# Patient Record
Sex: Female | Born: 1975 | Race: White | Hispanic: No | Smoking: Former smoker
Health system: Southern US, Community
[De-identification: ages and names within clinical notes are randomized; demographics above are authoritative.]

## PROBLEM LIST (undated history)

## (undated) DIAGNOSIS — R0600 Dyspnea, unspecified: Secondary | ICD-10-CM

## (undated) DIAGNOSIS — D649 Anemia, unspecified: Secondary | ICD-10-CM

## (undated) DIAGNOSIS — R87629 Unspecified abnormal cytological findings in specimens from vagina: Secondary | ICD-10-CM

## (undated) DIAGNOSIS — J45909 Unspecified asthma, uncomplicated: Secondary | ICD-10-CM

## (undated) DIAGNOSIS — F419 Anxiety disorder, unspecified: Secondary | ICD-10-CM

## (undated) DIAGNOSIS — N736 Female pelvic peritoneal adhesions (postinfective): Secondary | ICD-10-CM

## (undated) DIAGNOSIS — J449 Chronic obstructive pulmonary disease, unspecified: Secondary | ICD-10-CM

## (undated) HISTORY — DX: Female pelvic peritoneal adhesions (postinfective): N73.6

## (undated) HISTORY — DX: Anemia, unspecified: D64.9

## (undated) HISTORY — DX: Dyspnea, unspecified: R06.00

## (undated) HISTORY — PX: TUBAL LIGATION: SHX77

## (undated) HISTORY — PX: CHOLECYSTECTOMY: SHX55

## (undated) HISTORY — DX: Unspecified abnormal cytological findings in specimens from vagina: R87.629

## (undated) HISTORY — DX: Chronic obstructive pulmonary disease, unspecified: J44.9

---

## 2000-10-14 ENCOUNTER — Emergency Department (HOSPITAL_COMMUNITY): Admission: EM | Admit: 2000-10-14 | Discharge: 2000-10-15 | Payer: Self-pay | Admitting: Family Medicine

## 2000-10-21 ENCOUNTER — Other Ambulatory Visit: Admission: RE | Admit: 2000-10-21 | Discharge: 2000-10-21 | Payer: Self-pay | Admitting: Obstetrics and Gynecology

## 2001-01-05 ENCOUNTER — Inpatient Hospital Stay (HOSPITAL_COMMUNITY): Admission: AD | Admit: 2001-01-05 | Discharge: 2001-01-07 | Payer: Self-pay | Admitting: Obstetrics and Gynecology

## 2001-02-10 ENCOUNTER — Ambulatory Visit (HOSPITAL_COMMUNITY): Admission: RE | Admit: 2001-02-10 | Discharge: 2001-02-10 | Payer: Self-pay | Admitting: Obstetrics & Gynecology

## 2001-10-19 ENCOUNTER — Emergency Department (HOSPITAL_COMMUNITY): Admission: EM | Admit: 2001-10-19 | Discharge: 2001-10-19 | Payer: Self-pay

## 2002-01-09 ENCOUNTER — Emergency Department (HOSPITAL_COMMUNITY): Admission: EM | Admit: 2002-01-09 | Discharge: 2002-01-09 | Payer: Self-pay | Admitting: Emergency Medicine

## 2002-01-20 ENCOUNTER — Emergency Department (HOSPITAL_COMMUNITY): Admission: EM | Admit: 2002-01-20 | Discharge: 2002-01-20 | Payer: Self-pay | Admitting: Emergency Medicine

## 2002-07-12 ENCOUNTER — Emergency Department (HOSPITAL_COMMUNITY): Admission: EM | Admit: 2002-07-12 | Discharge: 2002-07-12 | Payer: Self-pay | Admitting: Emergency Medicine

## 2003-06-25 ENCOUNTER — Encounter: Admission: RE | Admit: 2003-06-25 | Discharge: 2003-06-25 | Payer: Self-pay | Admitting: *Deleted

## 2003-10-18 ENCOUNTER — Emergency Department (HOSPITAL_COMMUNITY): Admission: AD | Admit: 2003-10-18 | Discharge: 2003-10-18 | Payer: Self-pay | Admitting: Family Medicine

## 2004-02-01 ENCOUNTER — Emergency Department (HOSPITAL_COMMUNITY): Admission: EM | Admit: 2004-02-01 | Discharge: 2004-02-01 | Payer: Self-pay | Admitting: Emergency Medicine

## 2004-06-06 ENCOUNTER — Encounter: Admission: RE | Admit: 2004-06-06 | Discharge: 2004-06-06 | Payer: Self-pay | Admitting: Family Medicine

## 2004-06-06 ENCOUNTER — Other Ambulatory Visit: Admission: RE | Admit: 2004-06-06 | Discharge: 2004-06-06 | Payer: Self-pay | Admitting: Family Medicine

## 2004-06-08 ENCOUNTER — Encounter: Admission: RE | Admit: 2004-06-08 | Discharge: 2004-06-08 | Payer: Self-pay | Admitting: Sports Medicine

## 2004-07-03 ENCOUNTER — Emergency Department (HOSPITAL_COMMUNITY): Admission: EM | Admit: 2004-07-03 | Discharge: 2004-07-03 | Payer: Self-pay | Admitting: Emergency Medicine

## 2004-07-07 ENCOUNTER — Ambulatory Visit: Payer: Self-pay

## 2004-08-24 ENCOUNTER — Ambulatory Visit: Payer: Self-pay | Admitting: Family Medicine

## 2004-10-26 ENCOUNTER — Ambulatory Visit: Payer: Self-pay | Admitting: Sports Medicine

## 2004-11-12 ENCOUNTER — Emergency Department (HOSPITAL_COMMUNITY): Admission: EM | Admit: 2004-11-12 | Discharge: 2004-11-12 | Payer: Self-pay | Admitting: Family Medicine

## 2004-12-11 ENCOUNTER — Ambulatory Visit: Payer: Self-pay | Admitting: Family Medicine

## 2005-01-30 ENCOUNTER — Ambulatory Visit: Payer: Self-pay | Admitting: Family Medicine

## 2005-01-31 ENCOUNTER — Encounter: Admission: RE | Admit: 2005-01-31 | Discharge: 2005-01-31 | Payer: Self-pay | Admitting: Sports Medicine

## 2005-03-18 ENCOUNTER — Emergency Department (HOSPITAL_COMMUNITY): Admission: EM | Admit: 2005-03-18 | Discharge: 2005-03-18 | Payer: Self-pay | Admitting: Family Medicine

## 2005-04-26 ENCOUNTER — Ambulatory Visit: Payer: Self-pay | Admitting: Family Medicine

## 2005-06-05 ENCOUNTER — Encounter (INDEPENDENT_AMBULATORY_CARE_PROVIDER_SITE_OTHER): Payer: Self-pay | Admitting: *Deleted

## 2005-06-05 LAB — CONVERTED CEMR LAB

## 2005-06-29 ENCOUNTER — Ambulatory Visit: Payer: Self-pay | Admitting: Family Medicine

## 2005-08-19 ENCOUNTER — Emergency Department (HOSPITAL_COMMUNITY): Admission: EM | Admit: 2005-08-19 | Discharge: 2005-08-19 | Payer: Self-pay | Admitting: Emergency Medicine

## 2005-08-21 ENCOUNTER — Ambulatory Visit: Payer: Self-pay | Admitting: Family Medicine

## 2005-09-21 ENCOUNTER — Ambulatory Visit: Payer: Self-pay | Admitting: Family Medicine

## 2005-11-22 ENCOUNTER — Ambulatory Visit: Payer: Self-pay | Admitting: Family Medicine

## 2005-12-27 ENCOUNTER — Ambulatory Visit: Payer: Self-pay | Admitting: Family Medicine

## 2006-01-17 ENCOUNTER — Ambulatory Visit: Payer: Self-pay | Admitting: Family Medicine

## 2006-03-05 ENCOUNTER — Ambulatory Visit: Payer: Self-pay | Admitting: Family Medicine

## 2006-03-13 ENCOUNTER — Ambulatory Visit: Payer: Self-pay | Admitting: Family Medicine

## 2006-07-11 ENCOUNTER — Ambulatory Visit: Payer: Self-pay | Admitting: Family Medicine

## 2006-08-14 ENCOUNTER — Ambulatory Visit: Payer: Self-pay | Admitting: Sports Medicine

## 2006-08-22 ENCOUNTER — Ambulatory Visit: Payer: Self-pay | Admitting: Family Medicine

## 2006-09-04 ENCOUNTER — Ambulatory Visit: Payer: Self-pay | Admitting: Family Medicine

## 2006-10-05 ENCOUNTER — Emergency Department (HOSPITAL_COMMUNITY): Admission: EM | Admit: 2006-10-05 | Discharge: 2006-10-05 | Payer: Self-pay | Admitting: Family Medicine

## 2006-12-12 ENCOUNTER — Ambulatory Visit: Payer: Self-pay | Admitting: Family Medicine

## 2006-12-18 ENCOUNTER — Ambulatory Visit: Payer: Self-pay | Admitting: Family Medicine

## 2007-01-02 DIAGNOSIS — F17200 Nicotine dependence, unspecified, uncomplicated: Secondary | ICD-10-CM

## 2007-01-02 DIAGNOSIS — E669 Obesity, unspecified: Secondary | ICD-10-CM | POA: Insufficient documentation

## 2007-01-02 DIAGNOSIS — N879 Dysplasia of cervix uteri, unspecified: Secondary | ICD-10-CM | POA: Insufficient documentation

## 2007-01-02 DIAGNOSIS — J45909 Unspecified asthma, uncomplicated: Secondary | ICD-10-CM | POA: Insufficient documentation

## 2007-01-02 DIAGNOSIS — J309 Allergic rhinitis, unspecified: Secondary | ICD-10-CM | POA: Insufficient documentation

## 2007-01-02 DIAGNOSIS — N949 Unspecified condition associated with female genital organs and menstrual cycle: Secondary | ICD-10-CM

## 2007-01-02 DIAGNOSIS — F319 Bipolar disorder, unspecified: Secondary | ICD-10-CM | POA: Insufficient documentation

## 2007-01-02 HISTORY — DX: Nicotine dependence, unspecified, uncomplicated: F17.200

## 2007-01-02 HISTORY — DX: Obesity, unspecified: E66.9

## 2007-01-03 ENCOUNTER — Encounter (INDEPENDENT_AMBULATORY_CARE_PROVIDER_SITE_OTHER): Payer: Self-pay | Admitting: *Deleted

## 2007-01-10 ENCOUNTER — Ambulatory Visit: Payer: Self-pay | Admitting: Sports Medicine

## 2007-01-10 DIAGNOSIS — F411 Generalized anxiety disorder: Secondary | ICD-10-CM | POA: Insufficient documentation

## 2007-02-21 ENCOUNTER — Telehealth (INDEPENDENT_AMBULATORY_CARE_PROVIDER_SITE_OTHER): Payer: Self-pay | Admitting: *Deleted

## 2007-02-28 ENCOUNTER — Ambulatory Visit: Payer: Self-pay | Admitting: Family Medicine

## 2007-05-07 ENCOUNTER — Telehealth: Payer: Self-pay | Admitting: *Deleted

## 2007-05-12 ENCOUNTER — Telehealth: Payer: Self-pay | Admitting: *Deleted

## 2007-07-14 ENCOUNTER — Encounter (INDEPENDENT_AMBULATORY_CARE_PROVIDER_SITE_OTHER): Payer: Self-pay | Admitting: *Deleted

## 2007-07-25 ENCOUNTER — Ambulatory Visit: Payer: Self-pay | Admitting: Family Medicine

## 2007-07-25 ENCOUNTER — Telehealth (INDEPENDENT_AMBULATORY_CARE_PROVIDER_SITE_OTHER): Payer: Self-pay | Admitting: *Deleted

## 2007-09-19 ENCOUNTER — Ambulatory Visit: Payer: Self-pay | Admitting: Family Medicine

## 2007-09-19 DIAGNOSIS — N926 Irregular menstruation, unspecified: Secondary | ICD-10-CM

## 2007-09-19 HISTORY — DX: Irregular menstruation, unspecified: N92.6

## 2007-09-24 ENCOUNTER — Ambulatory Visit: Payer: Self-pay | Admitting: Family Medicine

## 2007-10-08 ENCOUNTER — Ambulatory Visit: Payer: Self-pay | Admitting: Family Medicine

## 2007-10-08 ENCOUNTER — Other Ambulatory Visit: Admission: RE | Admit: 2007-10-08 | Discharge: 2007-10-08 | Payer: Self-pay | Admitting: Emergency Medicine

## 2007-10-08 ENCOUNTER — Encounter (INDEPENDENT_AMBULATORY_CARE_PROVIDER_SITE_OTHER): Payer: Self-pay | Admitting: *Deleted

## 2007-10-08 ENCOUNTER — Telehealth: Payer: Self-pay | Admitting: *Deleted

## 2007-10-08 LAB — CONVERTED CEMR LAB
Beta hcg, urine, semiquantitative: NEGATIVE
Chlamydia, DNA Probe: NEGATIVE
Glucose, Urine, Semiquant: NEGATIVE
Ketones, urine, test strip: NEGATIVE
Nitrite: NEGATIVE
Protein, U semiquant: NEGATIVE
Urobilinogen, UA: 0.2

## 2007-10-13 ENCOUNTER — Ambulatory Visit (HOSPITAL_COMMUNITY): Admission: RE | Admit: 2007-10-13 | Discharge: 2007-10-13 | Payer: Self-pay | Admitting: Family Medicine

## 2007-10-13 ENCOUNTER — Encounter (INDEPENDENT_AMBULATORY_CARE_PROVIDER_SITE_OTHER): Payer: Self-pay | Admitting: *Deleted

## 2007-11-28 ENCOUNTER — Ambulatory Visit: Payer: Self-pay | Admitting: Family Medicine

## 2007-12-23 ENCOUNTER — Ambulatory Visit: Payer: Self-pay | Admitting: Family Medicine

## 2008-01-12 ENCOUNTER — Telehealth (INDEPENDENT_AMBULATORY_CARE_PROVIDER_SITE_OTHER): Payer: Self-pay | Admitting: *Deleted

## 2008-01-12 ENCOUNTER — Ambulatory Visit: Payer: Self-pay | Admitting: Sports Medicine

## 2008-03-18 ENCOUNTER — Telehealth: Payer: Self-pay | Admitting: Family Medicine

## 2008-05-20 ENCOUNTER — Telehealth: Payer: Self-pay | Admitting: Family Medicine

## 2008-06-12 ENCOUNTER — Emergency Department (HOSPITAL_COMMUNITY): Admission: EM | Admit: 2008-06-12 | Discharge: 2008-06-12 | Payer: Self-pay | Admitting: Emergency Medicine

## 2008-06-29 ENCOUNTER — Encounter: Payer: Self-pay | Admitting: *Deleted

## 2008-06-30 ENCOUNTER — Encounter: Payer: Self-pay | Admitting: Family Medicine

## 2008-06-30 ENCOUNTER — Ambulatory Visit: Payer: Self-pay | Admitting: Family Medicine

## 2008-06-30 LAB — CONVERTED CEMR LAB
ALT: 14 units/L (ref 0–35)
BUN: 7 mg/dL (ref 6–23)
Chloride: 110 meq/L (ref 96–112)
Creatinine, Ser: 0.7 mg/dL (ref 0.40–1.20)
Potassium: 4.1 meq/L (ref 3.5–5.3)
Sodium: 142 meq/L (ref 135–145)
Total Bilirubin: 0.3 mg/dL (ref 0.3–1.2)
Total Protein: 5.4 g/dL — ABNORMAL LOW (ref 6.0–8.3)

## 2008-07-09 ENCOUNTER — Ambulatory Visit: Payer: Self-pay | Admitting: Family Medicine

## 2008-07-09 DIAGNOSIS — K580 Irritable bowel syndrome with diarrhea: Secondary | ICD-10-CM | POA: Insufficient documentation

## 2008-07-09 DIAGNOSIS — K589 Irritable bowel syndrome without diarrhea: Secondary | ICD-10-CM | POA: Insufficient documentation

## 2008-07-09 HISTORY — DX: Irritable bowel syndrome, unspecified: K58.9

## 2008-08-10 ENCOUNTER — Telehealth: Payer: Self-pay | Admitting: *Deleted

## 2008-08-13 ENCOUNTER — Ambulatory Visit: Payer: Self-pay | Admitting: Family Medicine

## 2009-02-17 ENCOUNTER — Emergency Department (HOSPITAL_COMMUNITY): Admission: EM | Admit: 2009-02-17 | Discharge: 2009-02-17 | Payer: Self-pay | Admitting: Emergency Medicine

## 2009-02-17 ENCOUNTER — Telehealth: Payer: Self-pay | Admitting: Family Medicine

## 2009-05-09 ENCOUNTER — Emergency Department (HOSPITAL_COMMUNITY): Admission: EM | Admit: 2009-05-09 | Discharge: 2009-05-09 | Payer: Self-pay | Admitting: Emergency Medicine

## 2009-06-07 ENCOUNTER — Ambulatory Visit: Payer: Self-pay | Admitting: Family Medicine

## 2009-06-07 ENCOUNTER — Telehealth: Payer: Self-pay | Admitting: Family Medicine

## 2009-06-07 DIAGNOSIS — R109 Unspecified abdominal pain: Secondary | ICD-10-CM | POA: Insufficient documentation

## 2009-06-07 LAB — CONVERTED CEMR LAB
Bilirubin Urine: NEGATIVE
Glucose, Urine, Semiquant: NEGATIVE
Ketones, urine, test strip: NEGATIVE
Nitrite: NEGATIVE
Protein, U semiquant: NEGATIVE
Specific Gravity, Urine: 1.02
Urobilinogen, UA: 0.2
WBC Urine, dipstick: NEGATIVE
pH: 7

## 2009-09-15 ENCOUNTER — Ambulatory Visit: Payer: Self-pay | Admitting: Family Medicine

## 2009-09-15 ENCOUNTER — Telehealth: Payer: Self-pay | Admitting: Family Medicine

## 2009-09-15 DIAGNOSIS — R519 Headache, unspecified: Secondary | ICD-10-CM | POA: Insufficient documentation

## 2009-09-15 DIAGNOSIS — R51 Headache: Secondary | ICD-10-CM | POA: Insufficient documentation

## 2009-10-11 ENCOUNTER — Telehealth: Payer: Self-pay | Admitting: Family Medicine

## 2010-01-31 ENCOUNTER — Telehealth: Payer: Self-pay | Admitting: Family Medicine

## 2010-02-01 ENCOUNTER — Ambulatory Visit: Payer: Self-pay | Admitting: Family Medicine

## 2010-02-01 DIAGNOSIS — K625 Hemorrhage of anus and rectum: Secondary | ICD-10-CM | POA: Insufficient documentation

## 2010-02-01 LAB — CONVERTED CEMR LAB
Blood in Urine, dipstick: NEGATIVE
Glucose, Urine, Semiquant: NEGATIVE
WBC Urine, dipstick: NEGATIVE
pH: 6

## 2010-09-24 ENCOUNTER — Encounter: Payer: Self-pay | Admitting: Family Medicine

## 2010-10-05 ENCOUNTER — Telehealth (INDEPENDENT_AMBULATORY_CARE_PROVIDER_SITE_OTHER): Payer: Self-pay | Admitting: *Deleted

## 2010-10-05 ENCOUNTER — Encounter: Payer: Self-pay | Admitting: Family Medicine

## 2010-10-05 ENCOUNTER — Ambulatory Visit: Payer: Self-pay | Admitting: Family Medicine

## 2010-10-05 DIAGNOSIS — N771 Vaginitis, vulvitis and vulvovaginitis in diseases classified elsewhere: Secondary | ICD-10-CM

## 2010-10-05 DIAGNOSIS — N912 Amenorrhea, unspecified: Secondary | ICD-10-CM

## 2010-10-05 DIAGNOSIS — R358 Other polyuria: Secondary | ICD-10-CM

## 2010-10-05 LAB — CONVERTED CEMR LAB
Beta hcg, urine, semiquantitative: NEGATIVE
Bilirubin Urine: NEGATIVE
Blood in Urine, dipstick: NEGATIVE
Chlamydia, DNA Probe: NEGATIVE
GC Probe Amp, Genital: NEGATIVE
Glucose, Urine, Semiquant: NEGATIVE
Nitrite: NEGATIVE
Protein, U semiquant: NEGATIVE
Specific Gravity, Urine: 1.01
Urobilinogen, UA: 0.2
Whiff Test: NEGATIVE
pH: 7

## 2010-10-19 ENCOUNTER — Encounter: Payer: Self-pay | Admitting: Family Medicine

## 2010-10-19 ENCOUNTER — Ambulatory Visit: Payer: Self-pay | Admitting: Family Medicine

## 2010-10-19 DIAGNOSIS — R87613 High grade squamous intraepithelial lesion on cytologic smear of cervix (HGSIL): Secondary | ICD-10-CM | POA: Insufficient documentation

## 2010-10-25 ENCOUNTER — Encounter: Payer: Self-pay | Admitting: Family Medicine

## 2010-12-05 NOTE — Assessment & Plan Note (Signed)
Summary: vaginal itching , discharge,  period 5 days past due/ls   Vital Signs:  Patient profile:   35 year old female Height:      67.5 inches Weight:      223.9 pounds BMI:     34.67 Temp:     98.2 degrees F oral Pulse rate:   74 / minute BP sitting:   127 / 79  (left arm) Cuff size:   regular  Vitals Entered By: Garen Grams LPN (October 05, 2010 9:19 AM) CC: vaginal itching, discharge, amenorrhea Is Patient Diabetic? No Pain Assessment Patient in pain? no        Primary Care Avin Upperman:  Renold Don MD  CC:  vaginal itching, discharge, and amenorrhea.  History of Present Illness: 35 y/o F with   Vaginal itching since last Wed (1wk) that worsened last night.   +Vaginal discharge: clear, noticed it last week.  No odor. No painful intercourse No Fever, no chills, +nausea x 2 weeks, no vomiting, +constipation, -diarrhea, +some abd cramping.  No abd bleeding.   Amenorrahea: "was supposed to start on Sat".  Admits to not having regular menses.  LMP  09/02/10 Sexually active with her husband.  BTL 2002.  Habits & Providers  Alcohol-Tobacco-Diet     Tobacco Status: current     Tobacco Counseling: to quit use of tobacco products     Cigarette Packs/Day: 1.0  Allergies: No Known Drug Allergies  Review of Systems       per hpi   Physical Exam  General:  Well-developed,well-nourished,in no acute distress; alert,appropriate and cooperative throughout examination. vitals reviewed.  Genitalia:  Normal introitus for age, no external lesions, no vaginal discharge, mucosa pink and moist, no vaginal or cervical lesions, no vaginal atrophy, no friaility or hemorrhage, normal uterus size and position, no adnexal masses or tenderness   Impression & Recommendations:  Problem # 1:  VAGINITIS&VULVOVAGINITIS DISEASES CLASS ELSW (ICD-616.11) Assessment New Wet prep showed yeast, no clue cells or trich.  Will treat with Diflucan 150mg  x 1.    Orders: GC/Chlamydia-[facility]  (87591/87491) Wet Prep- [facility] (16109) [facility]- Est Level  3 (60454)  Problem # 2:  SCREENING FOR MALIGNANT NEOPLASM OF THE CERVIX (ICD-V76.2) No pap since 2006 because pt does not have insurance and does not have money to pay for visit.  Since we were doing speculum exam, I asked pt if she would like me to perform pap and she agreed.  She states that she is hoping to get a job soon so will have money to pay for exam.    Orders: Pap Smear-[facility] (09811-91478) [facility]- Est Level  3 (29562)  Problem # 3:  AMENORRHEA (ICD-626.0) Assessment: New UPT neg.  UA negative.    Orders: U Preg-[facility] (81025) [facility]- Est Level  3 (99213)  Complete Medication List: 1)  Klonopin 0.5 Mg Tabs (Clonazepam) .... Take 1 tablet by mouth daily as needed for anxiety 2)  Proventil Hfa 108 (90 Base) Mcg/act Aers (Albuterol sulfate) .... Inhale 2 puff using inhaler every four hours 3)  Nasarel 29 Mcg/act Soln (Flunisolide) .... 2 sprays each nostril two times a day 4)  Singulair 10 Mg Tabs (Montelukast sodium) .Marland Kitchen.. 1 by mouth daily 5)  Acetaminophen 650 Mg Cr-tabs (Acetaminophen) .... One tab by mouth q8h as needed for pain. 6)  Ibuprofen 800 Mg Tabs (Ibuprofen) .... One tab by mouth q6h as needed for pain. 7)  Ultram 50 Mg Tabs (Tramadol hcl) .... Take 1 tab by mouth  every 8 hours as needed for pain 8)  Hemorrhoidal-hc 25 Mg Supp (Hydrocortisone acetate) .... Apply per rectum twice a day 9)  Fluconazole 150 Mg Tabs (Fluconazole) .Marland Kitchen.. 1 tab by mouth x 1  Other Orders: Urinalysis-[facility] (00000) Prescriptions: FLUCONAZOLE 150 MG TABS (FLUCONAZOLE) 1 tab by mouth x 1  #1 x 0   Entered and Authorized by:   Angeline Slim MD   Signed by:   Angeline Slim MD on 10/05/2010   Method used:   Electronically to        [facility] [phone]* (retail)       [address]       [place], [place]  [address]       Ph: [phone]       Fax: [phone]   RxID:   [id number]    Orders Added: 1)  Urinalysis-[facility] [00000] 2)  U Preg-[facility]  [81025] 3)  GC/Chlamydia-[facility] [87591/87491] 4)  Wet Prep- [facility] [87210] 5)  Pap Smear-[facility] [18841-66063] 6)  [facility]- Est Level  3 [01601]     Prevention & Chronic Care Immunizations   Influenza vaccine: Fluvax Non-MCR  (08/13/2008)   Influenza vaccine due: 09/23/2008    Tetanus booster: 06/09/2004: Done.   Tetanus booster due: 06/09/2014    Pneumococcal vaccine: Not documented  Other Screening   Pap smear: Done.  (06/05/2005)   Pap smear due: 06/05/2006   Smoking status: current  (10/05/2010)   Smoking cessation counseling: yes  (07/09/2008)   Laboratory Results   Urine Tests  Date/Time Received: October 05, 2010 9:24 AM  Date/Time Reported: October 05, 2010 9:43 AM   Routine Urinalysis   Color: yellow Appearance: Clear Glucose: negative   (Normal Range: Negative) Bilirubin: negative   (Normal Range: Negative) Ketone: negative   (Normal Range: Negative) Spec. Gravity: 1.010   (Normal Range: 1.003-1.035) Blood: negative   (Normal Range: Negative) pH: 7.0   (Normal Range: 5.0-8.0) Protein: negative   (Normal Range: Negative) Urobilinogen: 0.2   (Normal Range: 0-1) Nitrite: negative   (Normal Range: Negative) Leukocyte Esterace: negative   (Normal Range: Negative)    Urine HCG: negative Comments: ...............test performed by......Marland KitchenBonnie A. [place]az[place], MLS (ASCP)cm  Date/Time Received: October 05, 2010 10:11 AM  Date/Time Reported: October 05, 2010 10:12 AM   [employer] Source: vaginal WBC/hpf: 5-10 Bacteria/hpf: 3+  Rods Clue cells/hpf: none  Negative whiff Yeast/hpf: few Trichomonas/hpf: none Comments: ...........test performed by...........Marland KitchenTerese Door, CMA

## 2010-12-05 NOTE — Progress Notes (Signed)
Summary: triage  Phone Note Call from Patient Call back at Home Phone [phone]   Caller: Patient Summary of Call: thinks she has yeast infection and needs to talk to nurse before she decides whether to come in. Initial call taken by: De Nurse,  October 05, 2010 8:33 AM  Follow-up for Phone Call        patient states she has vaginal itching and discharge, Five days late with period. appointment scheduled for work in this AM . Follow-up by: Theresia Lo RN,  October 05, 2010 8:44 AM

## 2010-12-05 NOTE — Miscellaneous (Signed)
  Clinical Lists Changes  Problems: Changed problem from ASTHMA, UNSPECIFIED (ICD-493.90) to ASTHMA, PERSISTENT (ICD-493.90) 

## 2010-12-05 NOTE — Assessment & Plan Note (Signed)
Summary: rectal bleeding & LLQ sore/[place]   Vital Signs:  Patient profile:   35 year old female Weight:      244.1 pounds Temp:     98.6 degrees F oral Pulse rate:   83 / minute Pulse rhythm:   regular BP sitting:   121 / 79  (left arm) Cuff size:   large  Vitals Entered By: Loralee Pacas CMA (February 01, 2010 8:46 AM) CC: Rigth flank pain and rectal bleeding Pain Assessment Patient in pain? yes     Location: abdomen Intensity: 1 Onset of pain  With activity Comments rectal bleeding and Right abd and back is sore.  when she does any activity the more she would bleed. per Dr. Burnadette Pop she was told that she has IBS.   Primary Care Provider:  Marisue Ivan  MD  CC:  Rigth flank pain and rectal bleeding.  History of Present Illness: 1. Right flank pain:  She has been dealing with this for years.  She was told by PCP that it is IBS but she thinks that it may be a hernia.  She is not sure what a hernia is.  It is off and on.  Acutely worse yesterday.  She had pain in the right flank, back, and abdomen.  It started yesterday morning and lasted through the day.  Rated a 10/10 at its worst.  Described as a sharp pain.  Worse with movement or activity.  It is better this morning.  Rated a 1/10 this morning.  Did not take anything for the pain.  It improved on its own.      ROS: denies dysuria, fevers, vaginal discharge  2. Rectal bleeding:  She had some rectal bleeding yesterday.  She has had this happen before in the past back in 2007.  It is non-painful.  It is bright red blood.  Not always associated with a bowel movement.  No rectal bleeding today.      ROS: denies recent weight loss, chills / night sweats, chest pain, shortness of breath  Current Medications (verified): 1)  Klonopin 0.5 Mg Tabs (Clonazepam) .... Take 1 Tablet By Mouth Daily As Needed For Anxiety 2)  Proventil Hfa 108 (90 Base) Mcg/act Aers (Albuterol Sulfate) .... Inhale 2 Puff Using Inhaler Every Four  Hours 3)  Nasarel 29 Mcg/act  Soln (Flunisolide) .... 2 Sprays Each Nostril Two Times A Day 4)  Singulair 10 Mg  Tabs (Montelukast Sodium) .Marland Kitchen.. 1 By Mouth Daily 5)  Acetaminophen 650 Mg Cr-Tabs (Acetaminophen) .... One Tab By Mouth Q8h As Needed For Pain. 6)  Ibuprofen 800 Mg Tabs (Ibuprofen) .... One Tab By Mouth Q6h As Needed For Pain. 7)  Ultram 50 Mg Tabs (Tramadol Hcl) .... Take 1 Tab By Mouth Every 8 Hours As Needed For Pain 8)  Hemorrhoidal-Hc 25 Mg Supp (Hydrocortisone Acetate) .... Apply Per Rectum Twice A Day  Allergies: No Known Drug Allergies  Past History:  Past Medical History: Reviewed history from 11/28/2007 and no changes required. cervical dysplasia 1997, no f/u  Social History: Reviewed history from 01/02/2007 and no changes required. Single. Lives with her 4 children in [place] [employer] housing.  FOB incarcerated.  In monogamous relationship since 2004.  Attending GED classes.  Rare EtOH, no recreational drugs.  Smokes 1/2 ppd currently, in the past more.  Physical Exam  General:  Vitals reviewed, obese, no acute distress Mouth:  pharynx pink and moist.   Neck:  supple and no masses.   Lungs:  normal respiratory effort, normal breath sounds, and no wheezes.   Heart:  normal rate, regular rhythm, and no murmur.   Abdomen:  TTP in Right abdomen, flank, and back Soft / no guarding / no rebound +BS Rectal:  Normal tone External hemorrhoids present FOBT + Msk:  no CVA tenderness FROM of lower back FROM of R hip Extremities:  no lower extremity edema Psych:  not anxious appearing and not depressed appearing.     Impression & Recommendations:  Problem # 1:  FLANK PAIN, RIGHT (ICD-789.09) Assessment Deteriorated Likely MSK in origin.  Will check UA.  Educated pt about signs / symptoms of a hernia.  Will treat with Ultram for pain. Her updated medication list for this problem includes:    Acetaminophen 650 Mg Cr-tabs (Acetaminophen) ..... One tab by mouth q8h as  needed for pain.    Ibuprofen 800 Mg Tabs (Ibuprofen) ..... One tab by mouth q6h as needed for pain.    Ultram 50 Mg Tabs (Tramadol hcl) .Marland Kitchen... Take 1 tab by mouth every 8 hours as needed for pain  Orders: Urinalysis-[facility] (00000) [facility]- Est  Level 4 (16109)  Problem # 2:  RECTAL BLEEDING (ICD-569.3) Assessment: New  Likely from hemmorhoids.  No active bleeding.  Gave pt option of seeing GI, she would rather treat as if it is a hemmorhoid and see if it gets better than going to GI for a colonoscopy.  Will treat with Anusol supp.  Orders: [facility]- Est  Level 4 (99214)  Complete Medication List: 1)  Klonopin 0.5 Mg Tabs (Clonazepam) .... Take 1 tablet by mouth daily as needed for anxiety 2)  Proventil Hfa 108 (90 Base) Mcg/act Aers (Albuterol sulfate) .... Inhale 2 puff using inhaler every four hours 3)  Nasarel 29 Mcg/act Soln (Flunisolide) .... 2 sprays each nostril two times a day 4)  Singulair 10 Mg Tabs (Montelukast sodium) .Marland Kitchen.. 1 by mouth daily 5)  Acetaminophen 650 Mg Cr-tabs (Acetaminophen) .... One tab by mouth q8h as needed for pain. 6)  Ibuprofen 800 Mg Tabs (Ibuprofen) .... One tab by mouth q6h as needed for pain. 7)  Ultram 50 Mg Tabs (Tramadol hcl) .... Take 1 tab by mouth every 8 hours as needed for pain 8)  Hemorrhoidal-hc 25 Mg Supp (Hydrocortisone acetate) .... Apply per rectum twice a day  Patient Instructions: 1)  I think that you are having two seperate issues 2)  I think that your right sided pain is most likely musculoskeletal 3)  I will treat that with Ultram 4)  If it comes back or gets more severe you need to return to clinic 5)  I think that the rectal bleeding was coming from hemorrhoids 6)  I will give you some Anusol medication to help with those 7)  If the bleeding persists or gets worse, I think that you probably should see a Gastroenterologist for a colonscopy 8)  Please schedule a follow up appointment with Dr. Burnadette Pop in 1-2  weeks Prescriptions: HEMORRHOIDAL-HC 25 MG SUPP (HYDROCORTISONE ACETATE) Apply per rectum twice a day  #30 x 0   Entered and Authorized by:   Angelena Sole MD   Signed by:   Angelena Sole MD on 02/01/2010   Method used:   Electronically to        [facility] [phone]* (retail)       [address]       [place], [place]  [address]       Ph: [phone]  Fax: [phone]   RxID:   [id number] ULTRAM 50 MG TABS (TRAMADOL HCL) take 1 tab by mouth every 8 hours as needed for pain  #30 x 0   Entered and Authorized by:   Angelena Sole MD   Signed by:   Angelena Sole MD on 02/01/2010   Method used:   Electronically to        [facility] [phone]* (retail)       [address]       [place], [place]  [address]       Ph: [phone]       Fax: [phone]   RxID:   [id number]   Laboratory Results   Urine Tests  Date/Time Received: February 01, 2010 9:22 AM  Date/Time Reported: February 01, 2010 9:35 AM   Routine Urinalysis   Color: yellow Appearance: Clear Glucose: negative   (Normal Range: Negative) Bilirubin: negative   (Normal Range: Negative) Ketone: negative   (Normal Range: Negative) Spec. Gravity: 1.010   (Normal Range: 1.003-1.035) Blood: negative   (Normal Range: Negative) pH: 6.0   (Normal Range: 5.0-8.0) Protein: negative   (Normal Range: Negative) Urobilinogen: 0.2   (Normal Range: 0-1) Nitrite: negative   (Normal Range: Negative) Leukocyte Esterace: negative   (Normal Range: Negative)    Comments: ...........test performed by...........Marland KitchenTerese Door, CMA

## 2010-12-05 NOTE — Progress Notes (Signed)
Summary: triage  Phone Note Call from Patient Call back at Home Phone [phone]   Caller: Patient Summary of Call: bright red blood in rectum and side sore Initial call taken by: De Nurse,  January 31, 2010 9:09 AM  Follow-up for Phone Call        steady bleeding this am. not associated with stool.  last bm was a few minutes ago. does not think she is bleeding now. has IBS  LMP was 2 weeks ago. . lower back is sore up to shoulders. has been building a Public affairs consultant. Thinks back pain might be from the new activity. has taken tylenol.  she is fearful of a hernia. described to her what one was & how it looks. she states the LLQ is slightly swollen all over. offered work in today. does not want that because all 4 kids are out of school & there will be a wait. appt at 8:30 am tomorrow. told her to call back if problems or she changes her mind. reminded her that we have a doctor on call when we are closed Follow-up by: Golden Circle RN,  January 31, 2010 9:41 AM

## 2010-12-05 NOTE — Assessment & Plan Note (Signed)
 Summary: HA since last night/[facility]/[place] pt   Vital Signs:  Patient profile:   35 year old female Height:      67.5 inches Weight:      244.6 pounds BMI:     37.88 Temp:     97.6 degrees F oral Pulse rate:   60 / minute BP sitting:   122 / 82  (left arm)  Vitals Entered By: Olam Keeling (September 15, 2009 3:49 PM) CC: C/o HA X 1 day, Headache Is Patient Diabetic? No Pain Assessment Patient in pain? yes     Location: head Intensity: 6 Type: sharp   CC:  C/o HA X 1 day and Headache.  Headache HPI:      The patient comes in for an acute visit for headaches.  The current headache started approximately 09/12/2009.  The headaches will last anywhere from 2 hours to 3 days at a time.  There is a family history of migraine headaches.        On a scale of 1-10, she describes the headache as a 7.  The headaches are not associated with an aura.  The location of the headaches are bilateral-starts left.  Headache quality is pressure or tightness and sharp (knife-like).  The headaches are associated with tearing of the eyes.        The patient denies first or worst H/A of life, change in frequency from prior H/A's, change in severity from prior H/A's, change in features from prior H/A's, new onset H/A's in middle-age or later, new or progressive H/A lasting days, H/A's with Valsalva (cough/sneeze), mylagia, fever, malaise, weight loss, scalp tenderness, jaw claudication, focal neurologic symptoms, confusion, seizures, and impaired level of consciousness.        Additional history: Worse with laying down in bed, better in morning. Starts behind left eye, with watering or eye, moves around entire head later and hurts in neck like a muscle tightening.    Headache Treatment History:      NSAID's were tried but not effective.  She has tried acetaminophen -plain and ibuprofen  which were ineffective.     Habits & Providers  Alcohol-Tobacco-Diet     Tobacco Status: current     Tobacco  Counseling: to quit use of tobacco products     Cigarette Packs/Day: 1.0  Allergies: No Known Drug Allergies  Past History:  Past Medical History: Last updated: 11/28/2007 cervical dysplasia 1997, no f/u  Past Surgical History: Last updated: 11/28/2007 BTL 2002 cholecystecomy 1998  Family History: Last updated: Feb 01, 2007 brother HTN, father deceased 2/2 MI at 35yo, mother had cervical ca, depression, no DM, breast or colon ca, sister has bipolar affective disorder, cervical ca  Social History: Last updated: Feb 01, 2007 Single. Lives with her 4 children in [place] [employer] housing.  FOB incarcerated.  In monogamous relationship since 2004.  Attending GED classes.  Rare EtOH, no recreational drugs.  Smokes 1/2 ppd currently, in the past more.  Social History: Packs/Day:  1.0  Review of Systems       See HPI  Physical Exam  General:  Well-developed,well-nourished,in no acute distress; alert,appropriate and cooperative throughout examination Head:  Normocephalic and atraumatic without obvious abnormalities. No apparent alopecia or balding. Eyes:  No corneal or conjunctival inflammation noted. EOMI. Perrla. Funduscopic exam benign, without hemorrhages, exudates or papilledema. Vision grossly normal. Neck:  No deformities, masses, or tenderness noted.  Headache Neurologic Exam:    Cranial Nerves Intact:  Yes    Focal Neurologic Deficit:  No  Motor Exam/Strength-Left:       Left Biceps Flexion ( ):  normal       Left Triceps Extension ( ):  normal       Left Hand Grasp ( ):  normal       Left Deltoid ( ):  normal       Left Hip Flexors ( ):  normal       Left Ankle Dorsiflexion (L5,L4):  normal       Left Great Toe Dorsiflexion (L5,L4):  normal       Left Heel Walk (L5,some L4):  normal       Left Single Squat & Rise-Quads (L4):  normal       Left Toe Walk-calf (S1):  normal    Motor Exam/Strength-Right:       Right Biceps Flexion ( ):  normal       Right Triceps Extension (  ):  normal       Right Hand Grasp ( ):  normal       Right Deltoid ( ):  normal       Right Hip Flexors ( ):  normal       Right Ankle Dorsiflexion (L5,L4):  normal       Right Great Toe Dorsiflexion (L5,L4):  normal       Right Heel Walk (L5,some L4):  normal       Right Single Squat & Rise-Quads (L4):  normal       Right Toe Walk-calf (S1):  normal    Sensory Exam/Pinprick-Left:       Left Face:  normal       Left Upper Extremity:  normal       Left Lower Extremity:  normal       Left Medial Foot (L4):  normal       Left Dorsal Foot (L5):  normal       Left Lateral Foot (S1):  normal    Sensory Exam/Pinprick-Right:       Right Face:  normal       Right Upper Extremity:  normal       Right Lower Extremity:  normal       Right Medial Foot (L4):  normal       Right Dorsal Foot (L5):  normal       Right Lateral Foot (S1):  normal    Reflexes-Left:       Left Biceps ( ):  normal       Left Triceps ( ):  normal       Left Brachioradialis ( ):  normal       Left Knee Jerk (L4):  normal       Left Ankle Reflex (S1):  normal    Reflexes-Right:       Right Biceps ( ):  normal       Right Triceps ( ):  normal       Right Brachioradialis ( ):  normal       Right Knee Jerk (L4):  normal       Right Ankle Reflex (S1):  normal   Complete Medication List: 1)  Klonopin  0.5 Mg Tabs (Clonazepam ) .... Take 1 tablet by mouth daily as needed for anxiety 2)  Proventil  Hfa 108 (90 Base) Mcg/act Aers (Albuterol  sulfate) .... Inhale 2 puff using inhaler every four hours 3)  Nasarel  29 Mcg/act Soln (Flunisolide ) .... 2 sprays each nostril two times a day 4)  Singulair   10 Mg Tabs (Montelukast  sodium) .SABRA.. 1 by mouth daily 5)  Acetaminophen  650 Mg Cr-tabs (Acetaminophen ) .... One tab by mouth q8h as needed for pain. 6)  Ibuprofen  800 Mg Tabs (Ibuprofen ) .... One tab by mouth q6h as needed for pain.  Other Orders: [facility]- Est Level  3 (99213) Ketorolac -Toradol  15mg  (G8114)  Headache  Assessment/Plan:    Headache Classification:  Tension headache    Comments:  Giving oxygen 6L via [place] x15 mins  (no non-rebreather available), not effective.    Additional Plans:  Will give Toradol  30 now, then Acetaminophen  650 and ibuprofen  800.  RTC within a week if no   HA 90% resolved and pt able to lay flat after shot of toradol .  Patient Instructions: 1)  Tylenol  650, Ibuprofen  800. 2)  RTC within a week if no better. 3)  -Dr. ONEIDA. Prescriptions: IBUPROFEN  800 MG TABS (IBUPROFEN ) One tab by mouth q6h as needed for pain.  #30 x 3   Entered and Authorized by:   Debby Petties MD   Signed by:   Debby Petties MD on 09/15/2009   Method used:   Electronically to        [facility] [phone]* (retail)       [address]       [place], [place]  [address]       Ph: [phone]       Fax: [phone]   RxID:   [id number] ACETAMINOPHEN  650 MG CR-TABS (ACETAMINOPHEN ) One tab by mouth q8h as needed for pain.  #30 x 3   Entered and Authorized by:   Debby Petties MD   Signed by:   Debby Petties MD on 09/15/2009   Method used:   Electronically to        [facility] [phone]* (retail)       [address]       [place], [place]  [address]       Ph: [phone]       Fax: [phone]   RxID:   [id number]    Medication Administration  Injection # 1:    Medication: Ketorolac -Toradol  15mg     Diagnosis: HEADACHE (ICD-784.0)    Route: IM    Site: RUOQ gluteus    Exp Date: 06/06/2011    Lot #: [id number]    Mfr: hospira    Patient tolerated injection without complications    Given by: Olam Keeling (September 15, 2009 4:46 PM)  Orders Added: 1)  [facility]- Est Level  3 [99213] 2)  Ketorolac -Toradol  15mg  [G8114]

## 2010-12-07 NOTE — Miscellaneous (Signed)
Summary: Procedures consent  Procedures consent   Imported By: De Nurse 10/31/2010 11:35:09  _____________________________________________________________________  External Attachment:    Type:   Image     Comment:   External Document

## 2010-12-07 NOTE — Assessment & Plan Note (Signed)
Summary: COLPO CLINIC PER DR TA/RH   Vital Signs:  Patient profile:   35 year old female LMP:     08/30/2010 Weight:      220 pounds Temp:     98.9 degrees F oral Pulse rate:   105 / minute Pulse rhythm:   regular BP sitting:   102 / 72  (left arm) Cuff size:   regular  Vitals Entered By: Loralee Pacas CMA (October 19, 2010 11:02 AM)  Primary Care Provider:  Renold Don MD   History of Present Illness: Pt comes in for colposcopy.  Pt has had one in past, many years ago, but no problems with pap smear since then.  She complains of groin pain from ovarian cysts and pain during intercourse.  She says she has not had a period sine 09/02/10, but that she had her tubes tied and does not think she is pregnant.   Allergies: No Known Drug Allergies  Physical Exam  General:  Well-developed,well-nourished,in no acute distress; alert,appropriate and cooperative throughout examination Genitalia:  Normal introitus for age, no external lesions, no vaginal discharge, mucosa pink and moist, small cervical lesion seen with acetic acid in 2 o'clock position cervical lesions, no vaginal atrophy, no friaility or hemorrhage, normal uterus size and position, no adnexal masses or tenderness Additional Exam:  Patient given informed consent, signed copy in the chart. Placed in lithotomy position. Cervix viewed with speculum and colposcope. Was the entire squamocolumnar junction seen?yes Any acetowhite lesions noted?yes Any abnormalities seen with green filter?no Any abnormalities seen with application of Lugol's solution?no Was the endocervical canal sampled?no Were any cervical biopsies taken?yes Were there any complications?no COMMENTS: Patient was given post procedure instructions. We will notify her of results.    Impression & Recommendations:  Problem # 1:  PAP SMER CERV W/HI GRADE SQUAMOUS INTRAEPITH LES (ICD-795.04)  Orders: Colposcopy w/ biopsy - [facility] ([id number])  Complete Medication  List: 1)  Klonopin 0.5 Mg Tabs (Clonazepam) .... Take 1 tablet by mouth daily as needed for anxiety 2)  Proventil Hfa 108 (90 Base) Mcg/act Aers (Albuterol sulfate) .... Inhale 2 puff using inhaler every four hours 3)  Nasarel 29 Mcg/act Soln (Flunisolide) .... 2 sprays each nostril two times a day 4)  Singulair 10 Mg Tabs (Montelukast sodium) .Marland Kitchen.. 1 by mouth daily 5)  Acetaminophen 650 Mg Cr-tabs (Acetaminophen) .... One tab by mouth q8h as needed for pain. 6)  Ibuprofen 800 Mg Tabs (Ibuprofen) .... One tab by mouth q6h as needed for pain. 7)  Ultram 50 Mg Tabs (Tramadol hcl) .... Take 1 tab by mouth every 8 hours as needed for pain 8)  Hemorrhoidal-hc 25 Mg Supp (Hydrocortisone acetate) .... Apply per rectum twice a day 9)  Fluconazole 150 Mg Tabs (Fluconazole) .Marland Kitchen.. 1 tab by mouth x 1  Other Orders: U Preg-[facility] ([id number])   Orders Added: 1)  U Preg-[facility] [[id number]] 2)  Colposcopy w/ biopsy - [facility] [address] [[id number]]    Laboratory Results   Urine Tests  Date/Time Received: October 19, 2010 11:09 AM  Date/Time Reported: October 19, 2010 11:25 AM     Urine HCG: negative Comments: ...............test performed by......Marland KitchenBonnie A. Swaz[place], MLS (ASCP)cm

## 2010-12-07 NOTE — Letter (Signed)
Summary: COLPOSCOPY  Letter  [facility]  [address]   [place], [place] [address]   Phone: [phone]  Fax: [phone]    10/25/2010  Kieanna Spadafore [address] [place], [place]  [address]  Dear Ms. Passmore,  THe cervical biopsy did not show any pre-cancerous cells. With your last pap smear being abnormal  and you history on the past of an abnromal pap, I would recommend that we repeat ths colposcopy in 3 months. Please schedule an appointment with me in March of 2011 in [facility] clinic.   I think the best way to deal with this is a repeat colposcopy in three months--if that is negative then I think we are done with additional follow up other than routine pap smear.Please call me with questions.          Sincerely,   Denny Levy MD  Appended Document: COLPOSCOPY  Letter mailed

## 2011-01-11 ENCOUNTER — Encounter: Payer: Self-pay | Admitting: Family Medicine

## 2011-01-11 ENCOUNTER — Ambulatory Visit (INDEPENDENT_AMBULATORY_CARE_PROVIDER_SITE_OTHER): Payer: Self-pay | Admitting: Family Medicine

## 2011-01-11 VITALS — BP 110/76 | HR 84 | Temp 98.6°F | Ht 67.0 in | Wt 224.6 lb

## 2011-01-11 DIAGNOSIS — F411 Generalized anxiety disorder: Secondary | ICD-10-CM

## 2011-01-11 DIAGNOSIS — L84 Corns and callosities: Secondary | ICD-10-CM

## 2011-01-11 MED ORDER — CLONAZEPAM 0.5 MG PO TABS: 0.5000 mg | ORAL_TABLET | Freq: Every day | ORAL | Status: DC | PRN

## 2011-01-11 NOTE — Patient Instructions (Signed)
Take the Klonopin as needed.  If you have any further troubles with your foot after trying the medicated cushion let me know. Good luck with all your tests!

## 2011-01-11 NOTE — Progress Notes (Signed)
  Subjective:    Patient ID: Samson Frederic, female    DOB: 06/01/1976, 35 y.o.   MRN: [id number]  HPI 1.  Anxiety:  Patient feels this is becoming a worsening issue for her.  Has been on Klonopin in past, hasn't needed refill since 06/2009.  Is studying for her GED, very anxious the day before her tests start.  Feels nauseous, "fluttery" feeling in stomach, resolves once test is finished.  Importantly, she is planning on leaving her husband to move back home to [place] this summer after her 4 children finish out the school year.  Husband secretly used crack cocaine for 7 years before stating he had stopped, she is concerned he has started again.  He is constantly on edge, yelling at children and her.  She states she can put up with it for a few more months as long as he doesn't hit anyone, and she states he does not do this or threaten to to this.  But all this obviously compounds her anxiety.    2.  Spots on bottom of foot:  Painful spots that she has had since she was a child.  Calls them calluses but someone told her they may be "warts."  Concerned because they cause her pain when she walks, also sometimes painful when not putting weight on them.  Trims them back with a knife. Located on ball of Right foot.  No bleeding from site.  Has never seen a podiatrist.  Has in past used medicated cushions that are to treat warts, with some relief, but these two spots have never completely resolved.     Review of Systems See HPI above for review of systems.       Objective:   Physical Exam Gen:  Alert, cooperative patient who appears stated age in no acute distress.  Vital signs reviewed. HEENT:  [place]/AT.  EOMI, PERRL.  MMM, tonsils non-erythematous, non-edematous.  External ears WNL, Bilateral TM's normal without retraction, redness or bulging.  Cardiac:  Regular rate and rhythm without murmur auscultated.  Good S1/S2. Pulm:  Clear to auscultation bilaterally with good air movement.  No wheezes or  rales noted.   Abd:  Soft/nondistended/nontender.  Obese  Good bowel sounds throughout all four quadrants.  No masses noted.  Right foot:  Calluses noted on 1st and 5th pad of metatarsal head.  Thickened yellow skin present.  No desquamation, no blood vessels noted.  Somewhat tender to touch, describes as aching pain.   Left foot:  No calluses noted.   Neuro:  CN ii-XII intact.  Slight intention tremor, which patient states only starts 1-2 days before she has a test (has one tomorrow).  No other focal motor/sensation deficits.          Assessment & Plan:

## 2011-01-11 NOTE — Assessment & Plan Note (Signed)
Patient with increased anxiety, related to marital strife as detailed above as well as increased testing.   Will refill Klonopin, last refill was 06/2009.  Only uses once every week or so.  Has not needed since August 2010.   Refill 30 with 1 extra refill.   Discussed SSRI treatment, as well as counseling/therapy options.  Patient without any insurance, would like to hold off on counseling.  If needs increased Klonopin, will start SSRI with benzo bridge.

## 2011-01-12 DIAGNOSIS — L84 Corns and callosities: Secondary | ICD-10-CM

## 2011-01-12 HISTORY — DX: Corns and callosities: L84

## 2011-03-23 NOTE — Op Note (Signed)
[facility]  Patient:    Sara Booth, Sara Booth                           MRN: [id number] Proc. Date: 02/10/01 Adm. Date:  [id number] Attending:  Lars Pinks                           Operative Report  PREOPERATIVE DIAGNOSIS:       Voluntary sterilization.  POSTOPERATIVE DIAGNOSIS:      Voluntary sterilization.  PROCEDURE:                    Laparoscopic bilateral tubal cautery for                               sterilization.  SURGEON:                      Richard D. Arlyce Dice, M.D.  ANESTHESIA:                   General endotracheal.  ESTIMATED BLOOD LOSS:         5 cc.  FINDINGS:                     Normal-appearing tubes, ovaries, and uterus. There was no evidence of endometriosis or pathology in the pelvis or abdomen.  INDICATIONS:                  This is a 35 year old gravida 4, para 4, who is status post spontaneous delivery five weeks prior to surgery.  The patient requests permanent sterilization.  The options were discussed with the patient, including an IUD, the probability of regret in a woman under the age of 65 was also stressed strongly with the patient; however, since she had already had four children, she was certain that she wanted permanent sterilization.  A failure rate of 2-5 per 1000 was also discussed with the patient preoperatively.  DESCRIPTION OF PROCEDURE:     The patient was taken to the operating room and general anesthesia was induced.  She was then placed in the dorsal lithotomy position, and the abdomen, perineum, and vagina were prepped and draped in a sterile fashion.  The bladder was emptied.  A Hulka tenaculum was introduced through the endocervical canal, and affixed to the anterior lip of the cervix. The surgeon was regowned and regloved.  The Veress needle was introduced through the base of the umbilicus into the peritoneal cavity.  A pneumoperitoneum was created.  A 5 mm trocar was introduced into  the pneumoperitoneum and a 5 mm laparoscope was introduced.  Under direct visualization a 5.0 mm suprapubic puncture was made, and the Klepinger forceps was introduced.  The pelvis was viewed, with the findings as noted above.  The left fallopian tube was identified and grasped at the isthmic/ampullary junction and cauterized along a 4.0 to 5.0 cm length.  The identical procedure was carried out on the contralateral tube.  The procedure was then terminated. The gas was allowed to escape.  The umbilical incision did not require suturing or Steri-Strips.  The suprapubic wound was closed with Steri-Strips.  The patient tolerated the procedure well and left the operating room in good condition. DD:  02/10/01 TD:  02/10/01 Job: [id number]

## 2011-07-20 ENCOUNTER — Telehealth: Payer: Self-pay | Admitting: Family Medicine

## 2011-07-20 DIAGNOSIS — F411 Generalized anxiety disorder: Secondary | ICD-10-CM

## 2011-07-20 MED ORDER — CLONAZEPAM 0.5 MG PO TABS: 0.5000 mg | ORAL_TABLET | Freq: Every day | ORAL | Status: DC | PRN

## 2011-07-20 NOTE — Telephone Encounter (Signed)
Called in prescription to [facility] pharmacy.

## 2011-07-20 NOTE — Telephone Encounter (Signed)
Had been on clanzapam and needs a refill - she is upset that her son ran away again last night and really need this today [facility] [address]

## 2011-07-20 NOTE — Telephone Encounter (Addendum)
Will forward message to Dr. Gwendolyn Grant. Paged to notify MD also.

## 2011-08-19 ENCOUNTER — Inpatient Hospital Stay (INDEPENDENT_AMBULATORY_CARE_PROVIDER_SITE_OTHER)
Admission: RE | Admit: 2011-08-19 | Discharge: 2011-08-19 | Disposition: A | Discharge status: Home / Self Care | Payer: Self-pay | Source: Ambulatory Visit | Attending: Emergency Medicine | Admitting: Emergency Medicine

## 2011-08-19 DIAGNOSIS — J45909 Unspecified asthma, uncomplicated: Secondary | ICD-10-CM

## 2011-08-19 DIAGNOSIS — J019 Acute sinusitis, unspecified: Secondary | ICD-10-CM

## 2011-12-20 ENCOUNTER — Encounter: Payer: Self-pay | Admitting: Family Medicine

## 2011-12-20 ENCOUNTER — Ambulatory Visit (INDEPENDENT_AMBULATORY_CARE_PROVIDER_SITE_OTHER): Payer: Self-pay | Admitting: Family Medicine

## 2011-12-20 VITALS — BP 118/78 | HR 98 | Temp 98.7°F | Ht 67.0 in | Wt 245.8 lb

## 2011-12-20 DIAGNOSIS — J329 Chronic sinusitis, unspecified: Secondary | ICD-10-CM

## 2011-12-20 DIAGNOSIS — R87613 High grade squamous intraepithelial lesion on cytologic smear of cervix (HGSIL): Secondary | ICD-10-CM

## 2011-12-20 DIAGNOSIS — F411 Generalized anxiety disorder: Secondary | ICD-10-CM

## 2011-12-20 HISTORY — DX: Chronic sinusitis, unspecified: J32.9

## 2011-12-20 MED ORDER — DOXYCYCLINE HYCLATE 100 MG PO TABS: 100.0000 mg | ORAL_TABLET | Freq: Two times a day (BID) | ORAL | Status: AC

## 2011-12-20 MED ORDER — CLONAZEPAM 0.5 MG PO TABS: 0.5000 mg | ORAL_TABLET | Freq: Every day | ORAL | Status: DC | PRN

## 2011-12-20 MED ORDER — FLUNISOLIDE 29 MCG/ACT NA SOLN: 2.0000 | Freq: Two times a day (BID) | NASAL | Status: DC

## 2011-12-20 NOTE — Progress Notes (Signed)
  Subjective:    Patient ID: Sara Booth, female    DOB: May 06, 1976, 36 y.o.   MRN: [id number]  HPI 1. URI symptoms:  Present for past 2-3 weeks.  Describes rhinorrhea, sinus congestion, mild cough.  Has tried over the counter Sudafed, Claritin, and Allegra without relief.  Sick contacts are none.  No fevers or chills. No nausea or vomiting.  She has been experiencing headaches for the past one days as well as some tooth pain.  2.  anxiety: Patient is increased anxiety. She states this is secondary to testing. She states that the Klonopin helped last year when she was on her period with her she's been off of this. She is not taking any since starting the semester. She has failed her last test due to anxiety from school as well as caring for an developmentally delayed child at home who is in need of extra testing. No suicidal or homicidal ideations. She denies any symptoms of depression.    Review of Systems See HPI above for review of systems.       Objective:   Physical Exam Gen:  Alert, cooperative patient who appears stated age in no acute distress.  Vital signs reviewed. Cardiac:  Regular rate and rhythm without murmur auscultated.  Good S1/S2. Pulm:  Clear to auscultation bilaterally with good air movement.  No wheezes or rales noted.   HEENT:  [place]/AT.  EOMI, PERRL. nasal turbinates erythematous and edematous bilaterally. Maxillary and frontal sinuses tender to palpation. MMM, tonsils non-erythematous, non-edematous.  External ears WNL, Bilateral TM's normal without retraction, redness or bulging.  Psych: Not depressed appearing. Thought process.        Assessment & Plan:

## 2011-12-20 NOTE — Assessment & Plan Note (Signed)
Patient with increased anxiety, mostly related to increased testing at school. Also with child who is now on disability who is undergoing changes in his treatment..   Will refill Klonopin, last refill was March 2012.  Only uses once every week or so.   Refill 30 with 1 extra refill.   Discussed SSRI treatment, as well as counseling/therapy options.  Patient without any insurance, would like to hold off on counseling.  If needs increased Klonopin, will start SSRI with benzo bridge.

## 2011-12-20 NOTE — Assessment & Plan Note (Signed)
Plan to treat due to length of symptoms and no improvement.   Will prescribe doxycycline as she is penicillin allergic. Instructed patient to return in 2 weeks for checkup or sooner if worsening or no improvement.

## 2011-12-20 NOTE — Assessment & Plan Note (Signed)
We discussed this and she will make an appointment in the next 3 - 4 weeks for Pap smear once her Medicaid comes through

## 2012-03-03 ENCOUNTER — Encounter: Payer: Self-pay | Admitting: Family Medicine

## 2012-03-03 ENCOUNTER — Ambulatory Visit (INDEPENDENT_AMBULATORY_CARE_PROVIDER_SITE_OTHER): Payer: Self-pay | Admitting: Family Medicine

## 2012-03-03 VITALS — BP 138/82 | HR 102 | Temp 98.2°F | Ht 67.0 in | Wt 252.0 lb

## 2012-03-03 DIAGNOSIS — F319 Bipolar disorder, unspecified: Secondary | ICD-10-CM

## 2012-03-03 HISTORY — DX: Bipolar disorder, unspecified: F31.9

## 2012-03-03 MED ORDER — DIVALPROEX SODIUM 500 MG PO DR TAB: 500.0000 mg | DELAYED_RELEASE_TABLET | Freq: Two times a day (BID) | ORAL | Status: DC

## 2012-03-03 NOTE — Progress Notes (Signed)
  Subjective:    Patient ID: Sara Booth, female    DOB: Nov 01, 1976, 36 y.o.   MRN: [id number]  HPI Here for work in appointment for mood  "so mad all the time"  Last week, went 3 day on 4 hours of sleep, did not feel sleepy.  Today, very fatigued, "all I wanna do is sleep"  Feels it was triggered by finding out about her husband's substance abuse   At baseline, feels very tired and drained, hard to fall asleep.   Takes klonopin once weekly, has taken daily for the past week. Per patient, was told she may have bipolar disorder in the past.  Was tried on paxil and Lexapro- did not like the way it  Make her feel.  Took i dose of lithium in past, also did not like the way it made her feel.  PHQ-9 21 very difficult GAD 7: 20, very difficult MDQ: 7 yes, moderate  I have reviewed patient's  PMH, FH, and Social history and Medications as related to this visit. FH: sister with bipolar do, mom and dad with mood disorder, dad with alcoholism.  Social Hx: lives with kids and father of children.  Was going to school but no longer going due to lack of concentration.   Smoker.  Rare alcohol.  No illicit drug use. Review of Systems     Objective:   Physical Exam GEN: Alert & Oriented, No acute distress CV:  Regular Rate & Rhythm, no murmur Respiratory:  Normal work of breathing, CTAB Psych:  Tearful at times.  Mood labile, judgement fair.  Alert and oriented.      Assessment & Plan:

## 2012-03-03 NOTE — Patient Instructions (Signed)
Will start prevention medicine called depakote- take one tablet twice a day.  Return on Friday morning (Day 4) before you have taken medication for a depakote level  Contact Jaynee Eagles for helpwith financial assitance  If you feel more unstable or have concerns about your safety or that of other, please call 911  Follow-up in 1 -2 weeks

## 2012-03-03 NOTE — Assessment & Plan Note (Addendum)
Confirmed diagnosis- appears to likely be bipolar 1 with history of recent manic episode.  Patient currently uninsured- does not think she can afford any additional cost for medication but is interested in medicines + therapy.  Not open to trying lithium again at this time.  Will attempt start at depakote- will get baseline labs, gave patient written rx, and advised to shop around.  Patient to return for  T[place] after starting.  Gave her info to apply with Jaynee Eagles and medicaid so we have further treatment options.  Will refer to therapy once has assitance.  Will follow-up in 1-2 weeks.  Advised on red flags to return for sooner evaluation.

## 2012-03-04 LAB — CBC
HCT: 43.9 % (ref 36.0–46.0)
MCV: 92.8 fL (ref 78.0–100.0)
RBC: 4.73 MIL/uL (ref 3.87–5.11)

## 2012-03-04 LAB — COMPREHENSIVE METABOLIC PANEL
ALT: 35 U/L (ref 0–35)
Albumin: 4.1 g/dL (ref 3.5–5.2)
Alkaline Phosphatase: 86 U/L (ref 39–117)
BUN: 5 mg/dL — ABNORMAL LOW (ref 6–23)
Calcium: 9.1 mg/dL (ref 8.4–10.5)
Chloride: 106 mEq/L (ref 96–112)
Creat: 0.54 mg/dL (ref 0.50–1.10)
Sodium: 141 mEq/L (ref 135–145)
Total Bilirubin: 0.4 mg/dL (ref 0.3–1.2)
Total Protein: 6.4 g/dL (ref 6.0–8.3)

## 2012-06-03 ENCOUNTER — Encounter (HOSPITAL_COMMUNITY): Payer: Self-pay | Admitting: Emergency Medicine

## 2012-06-03 ENCOUNTER — Emergency Department (INDEPENDENT_AMBULATORY_CARE_PROVIDER_SITE_OTHER)
Admission: EM | Admit: 2012-06-03 | Discharge: 2012-06-03 | Disposition: A | Discharge status: Home / Self Care | Payer: Self-pay | Source: Home / Self Care

## 2012-06-03 DIAGNOSIS — R109 Unspecified abdominal pain: Secondary | ICD-10-CM

## 2012-06-03 DIAGNOSIS — R05 Cough: Secondary | ICD-10-CM

## 2012-06-03 DIAGNOSIS — R51 Headache: Secondary | ICD-10-CM

## 2012-06-03 DIAGNOSIS — R059 Cough, unspecified: Secondary | ICD-10-CM

## 2012-06-03 DIAGNOSIS — B349 Viral infection, unspecified: Secondary | ICD-10-CM

## 2012-06-03 DIAGNOSIS — B9789 Other viral agents as the cause of diseases classified elsewhere: Secondary | ICD-10-CM

## 2012-06-03 HISTORY — DX: Anxiety disorder, unspecified: F41.9

## 2012-06-03 HISTORY — DX: Unspecified asthma, uncomplicated: J45.909

## 2012-06-03 LAB — POCT URINALYSIS DIP (DEVICE)
Glucose, UA: NEGATIVE mg/dL
Leukocytes, UA: NEGATIVE
Urobilinogen, UA: 0.2 mg/dL (ref 0.0–1.0)

## 2012-06-03 LAB — POCT RAPID STREP A: Streptococcus, Group A Screen (Direct): NEGATIVE

## 2012-06-03 MED ORDER — ONDANSETRON HCL 4 MG PO TABS: 4.0000 mg | ORAL_TABLET | Freq: Four times a day (QID) | ORAL | Status: AC

## 2012-06-03 NOTE — ED Notes (Signed)
Tired, lethargic, c/o center epigastric pain and cramping, denies any change in bowel routine.  Reports nausea, sore throat.  Unknown fever.

## 2012-06-03 NOTE — ED Provider Notes (Signed)
History     CSN: [id number]  Arrival date & time 06/03/12  1734   None     Chief Complaint  Patient presents with  . URI    (Consider location/radiation/quality/duration/timing/severity/associated sxs/prior treatment) The history is provided by the patient.  Sara Booth is a 36 y.o. female who complains of onset of cold symptoms for since this morning.  scratchy sore throat + cough, non productive No pleuritic pain No wheezing + nasal congestion No post-nasal drainage + sinus pain/pressure No laryngitis No chest congestion No itchy/red eyes No earache No hemoptysis No SOB No chills/sweats No fever + nausea No vomiting + abdominal cramping No diarrhea No skin rashes No fatigue No myalgias + headache  + ill contacts   Past Medical History  Diagnosis Date  . Asthma   . Anxiety     Past Surgical History  Procedure Date  . Cholecystectomy   . Tubal ligation     No family history on file.  History  Substance Use Topics  . Smoking status: Current Some Day Smoker -- 0.5 packs/day    Types: Cigarettes  . Smokeless tobacco: Not on file  . Alcohol Use: No    OB History    Grav Para Term Preterm Abortions TAB SAB Ect Mult Living                  Review of Systems  All other systems reviewed and are negative.    Allergies  Amoxicillin; Erythromycin; and Penicillins  Home Medications   Current Outpatient Rx  Name Route Sig Dispense Refill  . ACETAMINOPHEN ER 650 MG PO TBCR Oral Take 650 mg by mouth every 8 (eight) hours as needed.      . ALBUTEROL SULFATE HFA 108 (90 BASE) MCG/ACT IN AERS Inhalation Inhale 2 puffs into the lungs every 4 (four) hours as needed.      Marland Kitchen CLONAZEPAM 0.5 MG PO TABS Oral Take 1 tablet (0.5 mg total) by mouth daily as needed. 30 tablet 1  . DIVALPROEX SODIUM 500 MG PO TBEC Oral Take 1 tablet (500 mg total) by mouth 2 (two) times daily. 60 tablet 0  . FLUCONAZOLE 150 MG PO TABS Oral Take 150 mg by mouth once.      Marland Kitchen  FLUNISOLIDE 29 MCG/ACT (0.025%) NA SOLN Nasal Place 2 sprays into the nose 2 (two) times daily. Dose is for each nostril. 25 mL 3  . HYDROCORTISONE ACETATE 25 MG RE SUPP Rectal Place 25 mg rectally 2 (two) times daily.      . IBUPROFEN 800 MG PO TABS Oral Take 800 mg by mouth every 6 (six) hours as needed.      Marland Kitchen MONTELUKAST SODIUM 10 MG PO TABS Oral Take 10 mg by mouth daily.      . TRAMADOL HCL 50 MG PO TABS Oral Take 50 mg by mouth every 8 (eight) hours as needed.        BP 116/78  Pulse 80  Temp 98 F (36.7 C) (Oral)  Resp 20  SpO2 98%  LMP 05/27/2012  Physical Exam  Nursing note and vitals reviewed. Constitutional: She is oriented to person, place, and time. Vital signs are normal. She appears well-developed and well-nourished. She is active and cooperative.  HENT:  Head: Normocephalic.  Right Ear: Hearing, tympanic membrane, external ear and ear canal normal.  Left Ear: Hearing, tympanic membrane, external ear and ear canal normal.  Nose: Nose normal.  Mouth/Throat: Uvula is midline and mucous membranes are normal.  Posterior oropharyngeal erythema present. No oropharyngeal exudate or posterior oropharyngeal edema.  Eyes: Conjunctivae are normal. Pupils are equal, round, and reactive to light. No scleral icterus.  Neck: Trachea normal. Neck supple.  Cardiovascular: Normal rate, regular rhythm, normal heart sounds and normal pulses.   Pulmonary/Chest: Effort normal and breath sounds normal.  Abdominal: Soft. Normal appearance and bowel sounds are normal. There is tenderness in the epigastric area. There is no rebound and no CVA tenderness.  Lymphadenopathy:       Head (right side): No submental, no submandibular, no tonsillar, no preauricular, no posterior auricular and no occipital adenopathy present.       Head (left side): No submental, no submandibular, no tonsillar, no preauricular, no posterior auricular and no occipital adenopathy present.    She has no cervical adenopathy.     She has no axillary adenopathy.       Cervical lymph node tenderness  Neurological: She is alert and oriented to person, place, and time. No cranial nerve deficit or sensory deficit. GCS eye subscore is 4. GCS verbal subscore is 5. GCS motor subscore is 6.  Skin: Skin is warm and dry.  Psychiatric: She has a normal mood and affect. Her speech is normal and behavior is normal. Judgment and thought content normal. Cognition and memory are normal.    ED Course  Procedures (including critical care time)  Labs Reviewed  POCT URINALYSIS DIP (DEVICE) - Abnormal; Notable for the following:    Hgb urine dipstick SMALL (*)     All other components within normal limits  POCT RAPID STREP A ([facility] URG CARE ONLY)  Patient's last menstrual period was 05/27/2012.  No results found.   1. Viral illness   2. Headache   3. Cough   4. Abdominal pain       MDM  Discussed symptoms and management with patient, reports she normally has variable abdominal cramping and pain with IBS.  RTC if symptoms are not improved or worsen.        Johnsie Kindred, NP 06/03/12 2026  Johnsie Kindred, NP 06/03/12 2031

## 2012-06-12 NOTE — ED Provider Notes (Signed)
Medical screening examination/treatment/procedure(s) were performed by non-physician practitioner and as supervising physician I was immediately available for consultation/collaboration.  Luiz Blare MD  Luiz Blare, MD 06/12/12 [phone]

## 2012-09-26 ENCOUNTER — Ambulatory Visit (INDEPENDENT_AMBULATORY_CARE_PROVIDER_SITE_OTHER): Payer: Self-pay | Admitting: Family Medicine

## 2012-09-26 VITALS — BP 126/83 | HR 102 | Temp 98.2°F | Ht 67.0 in | Wt 240.0 lb

## 2012-09-26 DIAGNOSIS — F411 Generalized anxiety disorder: Secondary | ICD-10-CM

## 2012-09-26 MED ORDER — CLONAZEPAM 0.5 MG PO TABS: 0.5000 mg | ORAL_TABLET | Freq: Every day | ORAL | Status: DC | PRN

## 2012-09-26 NOTE — Progress Notes (Signed)
  Subjective:    Patient ID: Sara Booth, female    DOB: 04-14-1976, 36 y.o.   MRN: [id number]  HPI # Her anxiety has become severely worsened She has a history of bipolar I disorder and anxiety. Her social situation is very difficult. Her husband is going through rehabilitation for crack cocaine, and his behavior is sometimes very distressing to patient especially his expression of occasional lack of commitment to his family. She is trying to stay strong for her children whom she does not want to endure what she is enduring. She is also in school, nearing the end of her semester, and feels like she needs to focus better if she wants to pass.  She was unable to afford the Depakote recommended on 02/2012 when she was diagnosed in clinic.   She does not endorse manic symptoms (increased energy, risky behavior, increased irritability) however, her depression seems severe at this time (difficulty sleeping, poor appetite, decreased energy). She sometimes wonders why she was born but denies actively contemplating suicide with a plan.   Review of Systems  Allergies, medication, past medical history reviewed.      Objective:   Physical Exam GEN/PSYCH: agitated and tearful; appropriate to questions, however, wordy; speech is non-tangential  PHQ-9: 27, extremely difficult     Assessment & Plan:

## 2012-09-26 NOTE — Patient Instructions (Addendum)
Try Klonopin once or twice a day for the next few days  Follow-up with Dr. Madolyn Frieze early next week   Please go to [facility] if you feel unsafe  Thank you for coming in today

## 2012-09-26 NOTE — Assessment & Plan Note (Addendum)
Her anxiety is worse now with her being near finals in school. Her social situation with her husband remains a significant issue as well.  PLAN: We discussed how she seems to have these periods where her circumstances become more than she can handle and she ends up going on Klonopin for a short period of time but the root problem is not appropriately addressed. She agrees. We will try Klonopin for now. She was given information about [facility] for counseling and medical management as well; she was requesting counseling as well. She was also advised to register for MAP program to see if she will be eligible so that we would have more medication options available. She does not want to try Lithium and Depakote was too expensive for her. She was advised to go to [facility] ED if she felt unsafe. She feels safe and did not want to go there at this time.  She was amenable to plan.

## 2013-03-23 ENCOUNTER — Encounter: Payer: Self-pay | Admitting: Family Medicine

## 2013-03-23 ENCOUNTER — Telehealth: Payer: Self-pay | Admitting: Family Medicine

## 2013-03-23 ENCOUNTER — Ambulatory Visit (INDEPENDENT_AMBULATORY_CARE_PROVIDER_SITE_OTHER): Payer: Self-pay | Admitting: Family Medicine

## 2013-03-23 VITALS — BP 120/64 | HR 72 | Temp 98.3°F | Ht 67.0 in | Wt 225.0 lb

## 2013-03-23 DIAGNOSIS — N912 Amenorrhea, unspecified: Secondary | ICD-10-CM | POA: Insufficient documentation

## 2013-03-23 DIAGNOSIS — N926 Irregular menstruation, unspecified: Secondary | ICD-10-CM

## 2013-03-23 HISTORY — DX: Amenorrhea, unspecified: N91.2

## 2013-03-23 NOTE — Patient Instructions (Signed)
It was nice to meet you.  You aren't pregnant!  We are going to check a few labs.  Come back to see your regular doctor in a few weeks, she may want to check some different labs based on what we find today.

## 2013-03-23 NOTE — Telephone Encounter (Signed)
Pt didn't go to work today because of abd pain - hasn't had menses in 2 months and is not sure it that has anything to with it.

## 2013-03-23 NOTE — Telephone Encounter (Signed)
Patient c/o abd pain and having increased BMs (watery diarrhea; no blood).  Denies any nausea, vomiting, or fever.  Denies pregnancy--had BTL 12 years ago.  Requesting appt for today.  Scheduled appt for 2:15 pm today on overflow clinic.  Gaylene Brooks, RN

## 2013-03-23 NOTE — Assessment & Plan Note (Addendum)
Negative u preg today so unlikely to be ectopic or tubal pregnancy.  Will check TSH and FSH/LH (?early ovarian failure) to start w/u. Could consider checking testosterone (PCOS) and/or prolactin for further w/u in the future if these labs are normal.  No obvious link to her new bipolar medication (anticonvulsant not antipsychotic).  No red flags, patient is stable. Pt encouraged to f/u with PCP as this is an ongoing issue.

## 2013-03-23 NOTE — Progress Notes (Signed)
S: Pt comes in today for SDA for abdominal pain, breast tenderness, and amenorrhea.  She has had a BTL.  She reports negative pregnancy test at home, most recently this AM.   She reports that her last normal period was in Feb but did have 3 days of bleeding in March (usually 7 days), with no period in April or this month.  She does endorse irregular periods, but usually only misses 1 month, never 2 in a row.  She had a BTL in 2002.  She also reports intermittent R breast tenderness over the past few months, most recently with an episode a few days ago.  She has been having some lower pelvic/abdominal pain, bilaterally, for the past few weeks. NO associated dysuria or vaginal d/c.  Feels like she is going to get her period but hasn't.  No fevers/chills. Occasional, intermittent nausea, but this is unchanged from the past year. Mild diarrhea for the past few days, nonbloody, no constipation. Last BM was earlier today. ] She does have known diagnosis of bipolar d/o and trileptal (anticonvulsant) was started by [employer] ~2-3 months ago.   ROS: Per HPI  History  Smoking status  . Current Some Day Smoker -- 0.50 packs/day  . Types: Cigarettes  Smokeless tobacco  . Not on file    O:  Filed Vitals:   03/23/13 1517  BP: 120/64  Pulse: 72  Temp: 98.3 F (36.8 C)    Gen: NAD, obese, mild facial acne without obvious hirsutism  CV: RRR, no murmur Pulm: CTA bilat, no wheezes or crackles Abd: soft, nontender, nondistended, normoactive BS Ext: Warm,  no edema   A/P: 37 y.o. female p/w irregular periods and pelvic pain -See problem list -f/u in PRN with PCP

## 2013-03-24 ENCOUNTER — Encounter: Payer: Self-pay | Admitting: Family Medicine

## 2013-03-24 LAB — FSH/LH
FSH: 5.8 m[IU]/mL
LH: 4.9 m[IU]/mL

## 2015-04-21 ENCOUNTER — Encounter (HOSPITAL_COMMUNITY): Payer: Self-pay | Admitting: Emergency Medicine

## 2015-04-21 ENCOUNTER — Emergency Department (HOSPITAL_COMMUNITY)
Admission: EM | Admit: 2015-04-21 | Discharge: 2015-04-22 | Disposition: A | Discharge status: Home / Self Care | Payer: Self-pay | Attending: Emergency Medicine | Admitting: Emergency Medicine

## 2015-04-21 ENCOUNTER — Emergency Department (HOSPITAL_COMMUNITY): Payer: Self-pay

## 2015-04-21 ENCOUNTER — Emergency Department (HOSPITAL_COMMUNITY): Payer: BLUE CROSS/BLUE SHIELD

## 2015-04-21 ENCOUNTER — Emergency Department (HOSPITAL_COMMUNITY)
Admission: EM | Admit: 2015-04-21 | Discharge: 2015-04-21 | Disposition: A | Discharge status: Home / Self Care | Payer: BLUE CROSS/BLUE SHIELD | Attending: Emergency Medicine | Admitting: Emergency Medicine

## 2015-04-21 DIAGNOSIS — Z9851 Tubal ligation status: Secondary | ICD-10-CM | POA: Insufficient documentation

## 2015-04-21 DIAGNOSIS — R1030 Lower abdominal pain, unspecified: Secondary | ICD-10-CM

## 2015-04-21 DIAGNOSIS — N83209 Unspecified ovarian cyst, unspecified side: Secondary | ICD-10-CM

## 2015-04-21 DIAGNOSIS — Z880 Allergy status to penicillin: Secondary | ICD-10-CM | POA: Insufficient documentation

## 2015-04-21 DIAGNOSIS — F419 Anxiety disorder, unspecified: Secondary | ICD-10-CM | POA: Insufficient documentation

## 2015-04-21 DIAGNOSIS — N830 Follicular cyst of ovary: Secondary | ICD-10-CM | POA: Insufficient documentation

## 2015-04-21 DIAGNOSIS — Z720 Tobacco use: Secondary | ICD-10-CM | POA: Insufficient documentation

## 2015-04-21 DIAGNOSIS — J45909 Unspecified asthma, uncomplicated: Secondary | ICD-10-CM | POA: Insufficient documentation

## 2015-04-21 DIAGNOSIS — Q501 Developmental ovarian cyst: Secondary | ICD-10-CM | POA: Diagnosis not present

## 2015-04-21 DIAGNOSIS — Z79899 Other long term (current) drug therapy: Secondary | ICD-10-CM | POA: Insufficient documentation

## 2015-04-21 DIAGNOSIS — Z9049 Acquired absence of other specified parts of digestive tract: Secondary | ICD-10-CM | POA: Insufficient documentation

## 2015-04-21 DIAGNOSIS — R109 Unspecified abdominal pain: Secondary | ICD-10-CM

## 2015-04-21 DIAGNOSIS — Z3202 Encounter for pregnancy test, result negative: Secondary | ICD-10-CM | POA: Insufficient documentation

## 2015-04-21 DIAGNOSIS — Z8659 Personal history of other mental and behavioral disorders: Secondary | ICD-10-CM | POA: Insufficient documentation

## 2015-04-21 DIAGNOSIS — R5383 Other fatigue: Secondary | ICD-10-CM | POA: Insufficient documentation

## 2015-04-21 DIAGNOSIS — R1032 Left lower quadrant pain: Secondary | ICD-10-CM | POA: Insufficient documentation

## 2015-04-21 DIAGNOSIS — Z7951 Long term (current) use of inhaled steroids: Secondary | ICD-10-CM | POA: Insufficient documentation

## 2015-04-21 LAB — URINALYSIS, ROUTINE W REFLEX MICROSCOPIC
BILIRUBIN URINE: NEGATIVE
GLUCOSE, UA: NEGATIVE mg/dL
HGB URINE DIPSTICK: NEGATIVE
Ketones, ur: NEGATIVE mg/dL
Leukocytes, UA: NEGATIVE
Nitrite: NEGATIVE
PH: 5 (ref 5.0–8.0)
PROTEIN: NEGATIVE mg/dL
SPECIFIC GRAVITY, URINE: 1.022 (ref 1.005–1.030)
Urobilinogen, UA: 0.2 mg/dL (ref 0.0–1.0)

## 2015-04-21 LAB — I-STAT CHEM 8, ED
BUN: 3 mg/dL — ABNORMAL LOW (ref 6–20)
CREATININE: 0.6 mg/dL (ref 0.44–1.00)
Calcium, Ion: 1.2 mmol/L (ref 1.12–1.23)
Chloride: 101 mmol/L (ref 101–111)
GLUCOSE: 96 mg/dL (ref 65–99)
HCT: 39 % (ref 36.0–46.0)
Hemoglobin: 13.3 g/dL (ref 12.0–15.0)
Potassium: 4 mmol/L (ref 3.5–5.1)
Sodium: 139 mmol/L (ref 135–145)
TCO2: 22 mmol/L (ref 0–100)

## 2015-04-21 LAB — COMPREHENSIVE METABOLIC PANEL
ALBUMIN: 4 g/dL (ref 3.5–5.0)
ALK PHOS: 68 U/L (ref 38–126)
ALT: 27 U/L (ref 14–54)
AST: 21 U/L (ref 15–41)
Anion gap: 12 (ref 5–15)
BILIRUBIN TOTAL: 1.1 mg/dL (ref 0.3–1.2)
BUN: 9 mg/dL (ref 6–20)
CHLORIDE: 101 mmol/L (ref 101–111)
CO2: 23 mmol/L (ref 22–32)
Calcium: 8.8 mg/dL — ABNORMAL LOW (ref 8.9–10.3)
Creatinine, Ser: 0.75 mg/dL (ref 0.44–1.00)
GFR calc Af Amer: >60 mL/min (ref >60)
GFR calc non Af Amer: >60 mL/min (ref >60)
GLUCOSE: 104 mg/dL — AB (ref 65–99)
POTASSIUM: 3.5 mmol/L (ref 3.5–5.1)
Sodium: 136 mmol/L (ref 135–145)
Total Protein: 7.1 g/dL (ref 6.5–8.1)

## 2015-04-21 LAB — CBC WITH DIFFERENTIAL/PLATELET
BASOS ABS: 0 10*3/uL (ref 0.0–0.1)
Basophils Relative: 0 % (ref 0–1)
EOS ABS: 0.1 10*3/uL (ref 0.0–0.7)
EOS PCT: 1 % (ref 0–5)
HCT: 39.2 % (ref 36.0–46.0)
Hemoglobin: 13.7 g/dL (ref 12.0–15.0)
LYMPHS PCT: 17 % (ref 12–46)
Lymphs Abs: 1.8 10*3/uL (ref 0.7–4.0)
MCH: 30.2 pg (ref 26.0–34.0)
MCHC: 34.9 g/dL (ref 30.0–36.0)
MCV: 86.3 fL (ref 78.0–100.0)
Monocytes Absolute: 0.7 10*3/uL (ref 0.1–1.0)
Monocytes Relative: 7 % (ref 3–12)
NEUTROS PCT: 75 % (ref 43–77)
Neutro Abs: 7.9 10*3/uL — ABNORMAL HIGH (ref 1.7–7.7)
PLATELETS: 276 10*3/uL (ref 150–400)
RBC: 4.54 MIL/uL (ref 3.87–5.11)
RDW: 12.4 % (ref 11.5–15.5)
WBC: 10.6 10*3/uL — AB (ref 4.0–10.5)

## 2015-04-21 LAB — WET PREP, GENITAL
CLUE CELLS WET PREP: NONE SEEN
TRICH WET PREP: NONE SEEN
Yeast Wet Prep HPF POC: NONE SEEN

## 2015-04-21 LAB — GC/CHLAMYDIA PROBE AMP (~~LOC~~) NOT AT ARMC
Chlamydia: NEGATIVE
Neisseria Gonorrhea: NEGATIVE

## 2015-04-21 LAB — POC URINE PREG, ED: Preg Test, Ur: NEGATIVE

## 2015-04-21 MED ORDER — IOHEXOL 300 MG/ML  SOLN
80.0000 mL | Freq: Once | INTRAMUSCULAR | Status: AC | PRN
  Administered 2015-04-21: 80 mL via INTRAVENOUS

## 2015-04-21 MED ORDER — IOHEXOL 300 MG/ML  SOLN: 25.0000 mL | Freq: Once | INTRAMUSCULAR | Status: AC | PRN

## 2015-04-21 MED ORDER — ONDANSETRON HCL 4 MG/2ML IJ SOLN
4.0000 mg | Freq: Once | INTRAMUSCULAR | Status: AC
  Administered 2015-04-21: 4 mg via INTRAVENOUS
  Filled 2015-04-21: qty 2

## 2015-04-21 MED ORDER — HYDROMORPHONE HCL 1 MG/ML IJ SOLN
1.0000 mg | Freq: Once | INTRAMUSCULAR | Status: AC
  Administered 2015-04-21: 1 mg via INTRAVENOUS
  Filled 2015-04-21: qty 1

## 2015-04-21 MED ORDER — IBUPROFEN 800 MG PO TABS
800.0000 mg | ORAL_TABLET | Freq: Once | ORAL | Status: AC
  Administered 2015-04-21: 800 mg via ORAL
  Filled 2015-04-21: qty 1

## 2015-04-21 MED ORDER — ONDANSETRON 4 MG PO TBDP: 4.0000 mg | ORAL_TABLET | Freq: Three times a day (TID) | ORAL | Status: DC | PRN

## 2015-04-21 MED ORDER — OXYCODONE-ACETAMINOPHEN 5-325 MG PO TABS: 1.0000 | ORAL_TABLET | ORAL | Status: DC | PRN

## 2015-04-21 MED ORDER — SODIUM CHLORIDE 0.9 % IV BOLUS (SEPSIS)
1000.0000 mL | Freq: Once | INTRAVENOUS | Status: AC
  Administered 2015-04-21: 1000 mL via INTRAVENOUS

## 2015-04-21 NOTE — ED Notes (Signed)
Patient transported to Ultrasound 

## 2015-04-21 NOTE — ED Notes (Addendum)
The patient was seen here and advised she had several cysts on her ovarries.  She was given a prescription for pain but she says she took one and it is not helping her. The patient was seen here and advised she had several cysts on her ovarries.  She was given a prescription for pain but she says she took one and it is not helping her.   [facility]  She says she is hurting "really, really bad" and she cannot wait.

## 2015-04-21 NOTE — ED Notes (Signed)
PT ambulated with baseline gait; VSS; A&Ox3; no signs of distress; respirations even and unlabored; skin warm and dry; no questions upon discharge.  

## 2015-04-21 NOTE — ED Notes (Signed)
Pt. reports LLQ pain with nausea onset last night , denies emesis or diarrhea. No fever or chills.

## 2015-04-21 NOTE — ED Notes (Signed)
MD at bedside. 

## 2015-04-21 NOTE — ED Provider Notes (Signed)
CSN: [id number]     Arrival date & time 04/21/15  1858 History   First MD Initiated Contact with Patient 04/21/15 2010     Chief Complaint  Patient presents with  . Abdominal Pain    The patient was seen here and advised she had several cysts on her ovarries.  She was given a prescription for pain but she says she took one and it is not helping her.      (Consider location/radiation/quality/duration/timing/severity/associated sxs/prior Treatment) Patient is a 39 y.o. female presenting with abdominal pain.  Abdominal Pain Pain location:  LLQ Pain quality: stabbing   Pain radiates to:  Does not radiate Pain severity:  Severe Onset quality:  Gradual Duration:  16 hours Timing:  Constant Progression:  Worsening Chronicity:  New Context comment:  Patient seen earlier this morning and obtain ultrasound revealing 2 left ovarian dermoid cysts with no evidence of torsion. Patient discharged with pain and nausea medication. She returned this evening with worsening pain. Relieved by:  Nothing Worsened by:  Movement, palpation and position changes Ineffective treatments: Norco. Associated symptoms: fatigue, nausea and vomiting   Associated symptoms: no anorexia, no chest pain, no chills, no constipation, no cough, no diarrhea, no fever, no hematuria, no melena, no shortness of breath and no sore throat     Past Medical History  Diagnosis Date  . Asthma   . Anxiety    Past Surgical History  Procedure Laterality Date  . Cholecystectomy    . Tubal ligation     History reviewed. No pertinent family history. History  Substance Use Topics  . Smoking status: Current Some Day Smoker -- 0.00 packs/day    Types: Cigarettes  . Smokeless tobacco: Not on file  . Alcohol Use: No   OB History    No data available     Review of Systems  Constitutional: Positive for fatigue. Negative for fever, chills and appetite change.  HENT: Negative for congestion, ear pain, facial swelling, mouth sores  and sore throat.   Eyes: Negative for visual disturbance.  Respiratory: Negative for cough, chest tightness and shortness of breath.   Cardiovascular: Negative for chest pain and palpitations.  Gastrointestinal: Positive for nausea, vomiting and abdominal pain. Negative for diarrhea, constipation, blood in stool, melena and anorexia.  Endocrine: Negative for cold intolerance and heat intolerance.  Genitourinary: Negative for frequency, hematuria, decreased urine volume and difficulty urinating.  Musculoskeletal: Negative for back pain and neck stiffness.  Skin: Negative for rash.  Neurological: Negative for dizziness, weakness, light-headedness and headaches.  All other systems reviewed and are negative.     Allergies  Amoxicillin; Erythromycin; and Penicillins  Home Medications   Prior to Admission medications   Medication Sig Start Date End Date Taking? Authorizing Provider  acetaminophen (TYLENOL) 325 MG tablet Take 650 mg by mouth every 6 (six) hours as needed for mild pain or headache.   Yes Historical Provider, MD  ibuprofen (ADVIL,MOTRIN) 200 MG tablet Take 400 mg by mouth daily as needed for headache or moderate pain (swelling).   Yes Historical Provider, MD  ondansetron (ZOFRAN ODT) 4 MG disintegrating tablet Take 1 tablet (4 mg total) by mouth every 8 (eight) hours as needed for nausea. 04/21/15  Yes Larene Pickett, PA-C  oxyCODONE-acetaminophen (PERCOCET/ROXICET) 5-325 MG per tablet Take 1 tablet by mouth every 4 (four) hours as needed. 04/21/15  Yes Larene Pickett, PA-C  fluticasone (FLONASE) 50 MCG/ACT nasal spray Place 1 spray into both nostrils daily.  Historical Provider, MD   BP 109/58 mmHg  Pulse 48  Temp(Src) 97.9 F (36.6 C) (Oral)  Resp 14  SpO2 95%  LMP 04/14/2015 Physical Exam  Constitutional: She is oriented to person, place, and time. She appears well-developed and well-nourished. No distress.  HENT:  Head: Normocephalic and atraumatic.  Right Ear:  External ear normal.  Left Ear: External ear normal.  Nose: Nose normal.  Eyes: Conjunctivae and EOM are normal. Pupils are equal, round, and reactive to light. Right eye exhibits no discharge. Left eye exhibits no discharge. No scleral icterus.  Neck: Normal range of motion. Neck supple.  Cardiovascular: Normal rate, regular rhythm and normal heart sounds.  Exam reveals no gallop and no friction rub.   No murmur heard. Pulmonary/Chest: Effort normal and breath sounds normal. No stridor. No respiratory distress. She has no wheezes.  Abdominal: Soft. She exhibits no distension. There is tenderness in the left lower quadrant. There is no rigidity, no rebound, no guarding and no CVA tenderness. No hernia.  Musculoskeletal: She exhibits no edema or tenderness.  Neurological: She is alert and oriented to person, place, and time.  Skin: Skin is warm and dry. No rash noted. She is not diaphoretic. No erythema.  Psychiatric: She has a normal mood and affect.    ED Course  Procedures (including critical care time) Labs Review Labs Reviewed  I-STAT CHEM 8, ED - Abnormal; Notable for the following:    BUN 3 (*)    All other components within normal limits    Imaging Review US Transvaginal Non-ob  04/21/2015   CLINICAL DATA:  Acute onset of generalized abdominal pain. Initial encounter.  EXAM: TRANSABDOMINAL AND TRANSVAGINAL ULTRASOUND OF PELVIS  DOPPLER ULTRASOUND OF OVARIES  TECHNIQUE: Both transabdominal and transvaginal ultrasound examinations of the pelvis were performed. Transabdominal technique was performed for global imaging of the pelvis including uterus, ovaries, adnexal regions, and pelvic cul-de-sac.  It was necessary to proceed with endovaginal exam following the transabdominal exam to visualize the uterus and ovaries in greater detail. Color and duplex Doppler ultrasound was utilized to evaluate blood flow to the ovaries.  COMPARISON:  None.  FINDINGS: Uterus  Measurements: 8.4 x 4.8 x  5.6 cm. No fibroids or other mass visualized. The uterus is retroverted.  Endometrium  Thickness: 0.5 cm.  No focal abnormality visualized.  Right ovary  Measurements: 4.2 x 2.8 x 3.1 cm. Normal appearance/no adnexal mass.  Left ovary  Measurements: 7.7 x 4.4 x 6.2 cm. Two rounded heterogeneous hyperechoic lesions are again noted within the left ovary, measuring up to 3.3 cm in size.  Pulsed Doppler evaluation of both ovaries demonstrates normal low-resistance arterial and venous waveforms.  Other findings  A moderate amount of free fluid is noted within the pelvis. This is particulate in nature, raising concern for some degree of hemorrhage.  IMPRESSION: 1. New moderate amount of free fluid within the pelvis is particulate in nature, raising concern for some degree of hemorrhage. This may reflect hemorrhage from one of the heterogeneous hyperechoic masses within the left ovary, which measure up to 3.3 cm in size. Hemorrhage is new from the study performed this morning. GYN Surgery consultation is recommended. 2. No evidence for ovarian torsion. The uterus is unremarkable in appearance.  These results were called by telephone at the time of interpretation on 04/21/2015 at 10:49 pm to Dr. Addison Lank, who verbally acknowledged these results.   Electronically Signed   By: Garald Balding M.D.   On: 04/21/2015  22:51   US Transvaginal Non-ob  04/21/2015   CLINICAL DATA:  39 year old female with left lower quadrant pain for 1 day. Initial encounter. LMP 04/12/2015  EXAM: TRANSABDOMINAL AND TRANSVAGINAL ULTRASOUND OF PELVIS  DOPPLER ULTRASOUND OF OVARIES  TECHNIQUE: Both transabdominal and transvaginal ultrasound examinations of the pelvis were performed. Transabdominal technique was performed for global imaging of the pelvis including uterus, ovaries, adnexal regions, and pelvic cul-de-sac.  It was necessary to proceed with endovaginal exam following the transabdominal exam to visualize the ovaries. Color and duplex  Doppler ultrasound was utilized to evaluate blood flow to the ovaries.  COMPARISON:  Pelvis ultrasound 10/13/2007. CT Abdomen and Pelvis 06/25/2003  FINDINGS: Uterus  Measurements: 9.4 x 4.2 x 5.8 cm. No fibroids or other mass visualized.  Endometrium  Thickness: 9 mm.  No focal abnormality visualized.  Right ovary  Measurements: 3.9 x 2.9 x 3.1 cm. 1.5 cm simple cyst, physiologic in this age group. Normal appearance/no adnexal mass.  Left ovary  Measurements: 7.3 x 3.7 x 6.6 cm. There are 2 rounded hyperechoic lesions, measuring 3.5-3.9 cm diameter each (image 58). There is some associated edge shadowing of both. These do not appear hypervascular on color Doppler. There is a small superimposed 17 mm simple cyst.  Pulsed Doppler evaluation of both ovaries demonstrates normal low-resistance arterial and venous waveforms.  Other findings  Trace simple appearing free fluid.  IMPRESSION: 1. Abnormal left ovary, with no evidence of ovarian torsion, but with 2 hyperechoic heterogeneous masses each 3.5-4 cm diameter. Sonographic appearance favors ovarian dermoid(s). These are new since 2008. Recommend GYN Surgery consultation. 2. Otherwise negative pelvis.  No evidence of ovarian torsion.   Electronically Signed   By: Genevie Ann M.D.   On: 04/21/2015 09:48   US Pelvis Complete  04/21/2015   CLINICAL DATA:  Acute onset of generalized abdominal pain. Initial encounter.  EXAM: TRANSABDOMINAL AND TRANSVAGINAL ULTRASOUND OF PELVIS  DOPPLER ULTRASOUND OF OVARIES  TECHNIQUE: Both transabdominal and transvaginal ultrasound examinations of the pelvis were performed. Transabdominal technique was performed for global imaging of the pelvis including uterus, ovaries, adnexal regions, and pelvic cul-de-sac.  It was necessary to proceed with endovaginal exam following the transabdominal exam to visualize the uterus and ovaries in greater detail. Color and duplex Doppler ultrasound was utilized to evaluate blood flow to the ovaries.   COMPARISON:  None.  FINDINGS: Uterus  Measurements: 8.4 x 4.8 x 5.6 cm. No fibroids or other mass visualized. The uterus is retroverted.  Endometrium  Thickness: 0.5 cm.  No focal abnormality visualized.  Right ovary  Measurements: 4.2 x 2.8 x 3.1 cm. Normal appearance/no adnexal mass.  Left ovary  Measurements: 7.7 x 4.4 x 6.2 cm. Two rounded heterogeneous hyperechoic lesions are again noted within the left ovary, measuring up to 3.3 cm in size.  Pulsed Doppler evaluation of both ovaries demonstrates normal low-resistance arterial and venous waveforms.  Other findings  A moderate amount of free fluid is noted within the pelvis. This is particulate in nature, raising concern for some degree of hemorrhage.  IMPRESSION: 1. New moderate amount of free fluid within the pelvis is particulate in nature, raising concern for some degree of hemorrhage. This may reflect hemorrhage from one of the heterogeneous hyperechoic masses within the left ovary, which measure up to 3.3 cm in size. Hemorrhage is new from the study performed this morning. GYN Surgery consultation is recommended. 2. No evidence for ovarian torsion. The uterus is unremarkable in appearance.  These results were called  by telephone at the time of interpretation on 04/21/2015 at 10:49 pm to Dr. Addison Lank, who verbally acknowledged these results.   Electronically Signed   By: Garald Balding M.D.   On: 04/21/2015 22:51   US Pelvis Complete  04/21/2015   CLINICAL DATA:  39 year old female with left lower quadrant pain for 1 day. Initial encounter. LMP 04/12/2015  EXAM: TRANSABDOMINAL AND TRANSVAGINAL ULTRASOUND OF PELVIS  DOPPLER ULTRASOUND OF OVARIES  TECHNIQUE: Both transabdominal and transvaginal ultrasound examinations of the pelvis were performed. Transabdominal technique was performed for global imaging of the pelvis including uterus, ovaries, adnexal regions, and pelvic cul-de-sac.  It was necessary to proceed with endovaginal exam following the  transabdominal exam to visualize the ovaries. Color and duplex Doppler ultrasound was utilized to evaluate blood flow to the ovaries.  COMPARISON:  Pelvis ultrasound 10/13/2007. CT Abdomen and Pelvis 06/25/2003  FINDINGS: Uterus  Measurements: 9.4 x 4.2 x 5.8 cm. No fibroids or other mass visualized.  Endometrium  Thickness: 9 mm.  No focal abnormality visualized.  Right ovary  Measurements: 3.9 x 2.9 x 3.1 cm. 1.5 cm simple cyst, physiologic in this age group. Normal appearance/no adnexal mass.  Left ovary  Measurements: 7.3 x 3.7 x 6.6 cm. There are 2 rounded hyperechoic lesions, measuring 3.5-3.9 cm diameter each (image 58). There is some associated edge shadowing of both. These do not appear hypervascular on color Doppler. There is a small superimposed 17 mm simple cyst.  Pulsed Doppler evaluation of both ovaries demonstrates normal low-resistance arterial and venous waveforms.  Other findings  Trace simple appearing free fluid.  IMPRESSION: 1. Abnormal left ovary, with no evidence of ovarian torsion, but with 2 hyperechoic heterogeneous masses each 3.5-4 cm diameter. Sonographic appearance favors ovarian dermoid(s). These are new since 2008. Recommend GYN Surgery consultation. 2. Otherwise negative pelvis.  No evidence of ovarian torsion.   Electronically Signed   By: Genevie Ann M.D.   On: 04/21/2015 09:48   Ct Abdomen Pelvis W Contrast  04/21/2015   CLINICAL DATA:  Left lower quadrant pain starting yesterday about 3:30. Pain medication without relief.  EXAM: CT ABDOMEN AND PELVIS WITH CONTRAST  TECHNIQUE: Multidetector CT imaging of the abdomen and pelvis was performed using the standard protocol following bolus administration of intravenous contrast.  CONTRAST:  46mL OMNIPAQUE IOHEXOL 300 MG/ML  SOLN  COMPARISON:  Ultrasound pelvis 04/21/2015. CT abdomen and pelvis 06/25/2003  FINDINGS: Atelectasis in the lung bases. Surgical absence of the gallbladder. The liver, spleen, pancreas, adrenal glands, abdominal  aorta, inferior vena cava, and retroperitoneal lymph nodes are unremarkable. Subcentimeter lesions in the left kidney probably representing cysts. No hydronephrosis in either kidney. Retro aortic left renal vein. The stomach, small bowel, and colon are not abnormally distended. No free air or free fluid in the abdomen. Abdominal wall musculature appears intact.  Pelvis: The appendix is normal. Small to moderate free fluid in the pelvis with increased density consistent with hemorrhagic fluid. This may be physiologic. Infection is also possible. Two pelvic lesions posteriorly, presumably ovarian, demonstrate multiple densities including fat consistent with dermoid cysts. Uterus is not enlarged. Bladder wall is not thickened. No significant pelvic lymphadenopathy. No destructive bone lesions.  IMPRESSION: Moderate complex free fluid in the pelvis suggesting hemorrhage or infection. Bilateral ovarian lesions containing fat consistent with dermoid cysts.   Electronically Signed   By: Lucienne Capers M.D.   On: 04/21/2015 23:38   [place] Art/ven Flow Abd Pelv Doppler  04/21/2015   CLINICAL DATA:  Acute onset of generalized abdominal pain. Initial encounter.  EXAM: TRANSABDOMINAL AND TRANSVAGINAL ULTRASOUND OF PELVIS  DOPPLER ULTRASOUND OF OVARIES  TECHNIQUE: Both transabdominal and transvaginal ultrasound examinations of the pelvis were performed. Transabdominal technique was performed for global imaging of the pelvis including uterus, ovaries, adnexal regions, and pelvic cul-de-sac.  It was necessary to proceed with endovaginal exam following the transabdominal exam to visualize the uterus and ovaries in greater detail. Color and duplex Doppler ultrasound was utilized to evaluate blood flow to the ovaries.  COMPARISON:  None.  FINDINGS: Uterus  Measurements: 8.4 x 4.8 x 5.6 cm. No fibroids or other mass visualized. The uterus is retroverted.  Endometrium  Thickness: 0.5 cm.  No focal abnormality visualized.  Right ovary   Measurements: 4.2 x 2.8 x 3.1 cm. Normal appearance/no adnexal mass.  Left ovary  Measurements: 7.7 x 4.4 x 6.2 cm. Two rounded heterogeneous hyperechoic lesions are again noted within the left ovary, measuring up to 3.3 cm in size.  Pulsed Doppler evaluation of both ovaries demonstrates normal low-resistance arterial and venous waveforms.  Other findings  A moderate amount of free fluid is noted within the pelvis. This is particulate in nature, raising concern for some degree of hemorrhage.  IMPRESSION: 1. New moderate amount of free fluid within the pelvis is particulate in nature, raising concern for some degree of hemorrhage. This may reflect hemorrhage from one of the heterogeneous hyperechoic masses within the left ovary, which measure up to 3.3 cm in size. Hemorrhage is new from the study performed this morning. GYN Surgery consultation is recommended. 2. No evidence for ovarian torsion. The uterus is unremarkable in appearance.  These results were called by telephone at the time of interpretation on 04/21/2015 at 10:49 pm to Dr. Addison Lank, who verbally acknowledged these results.   Electronically Signed   By: Garald Balding M.D.   On: 04/21/2015 22:51   [place] Art/ven Flow Abd Pelv Doppler  04/21/2015   CLINICAL DATA:  39 year old female with left lower quadrant pain for 1 day. Initial encounter. LMP 04/12/2015  EXAM: TRANSABDOMINAL AND TRANSVAGINAL ULTRASOUND OF PELVIS  DOPPLER ULTRASOUND OF OVARIES  TECHNIQUE: Both transabdominal and transvaginal ultrasound examinations of the pelvis were performed. Transabdominal technique was performed for global imaging of the pelvis including uterus, ovaries, adnexal regions, and pelvic cul-de-sac.  It was necessary to proceed with endovaginal exam following the transabdominal exam to visualize the ovaries. Color and duplex Doppler ultrasound was utilized to evaluate blood flow to the ovaries.  COMPARISON:  Pelvis ultrasound 10/13/2007. CT Abdomen and Pelvis  06/25/2003  FINDINGS: Uterus  Measurements: 9.4 x 4.2 x 5.8 cm. No fibroids or other mass visualized.  Endometrium  Thickness: 9 mm.  No focal abnormality visualized.  Right ovary  Measurements: 3.9 x 2.9 x 3.1 cm. 1.5 cm simple cyst, physiologic in this age group. Normal appearance/no adnexal mass.  Left ovary  Measurements: 7.3 x 3.7 x 6.6 cm. There are 2 rounded hyperechoic lesions, measuring 3.5-3.9 cm diameter each (image 58). There is some associated edge shadowing of both. These do not appear hypervascular on color Doppler. There is a small superimposed 17 mm simple cyst.  Pulsed Doppler evaluation of both ovaries demonstrates normal low-resistance arterial and venous waveforms.  Other findings  Trace simple appearing free fluid.  IMPRESSION: 1. Abnormal left ovary, with no evidence of ovarian torsion, but with 2 hyperechoic heterogeneous masses each 3.5-4 cm diameter. Sonographic appearance favors ovarian dermoid(s). These are new since 2008. Recommend GYN Surgery  consultation. 2. Otherwise negative pelvis.  No evidence of ovarian torsion.   Electronically Signed   By: Genevie Ann M.D.   On: 04/21/2015 09:48     EKG Interpretation None      MDM   39 year old female seen here earlier this morning for left lower quadrant abdominal pain where ultrasound revealed 2 left ovarian dermoid cysts returns with worsening pain that was not relieved by prescription meds. A repeat ultrasound for torsion revealed no evidence of torsion however increased fluid collection likely hemorrhage. CT abdomen revealed's ovarian cystic hemorrhage with no other etiologies. Repeat hemoglobin is stable. I'll be contacted and aware. Patient has an appointment with the obstetrician in July. She is provided with pain medication and had significant relief.  Patient in good condition and stable for discharge with strict return precautions. Patient to follow-up with OB as scheduled above.  Patient seen in conjunction with Dr.  Colin Rhein.  Final diagnoses:  Hemorrhagic cyst of ovary        Addison Lank, MD 04/22/15 7530  Debby Freiberg, MD 04/23/15 [id number]

## 2015-04-21 NOTE — ED Provider Notes (Signed)
CSN: [id number]     Arrival date & time 04/21/15  0345 History   First MD Initiated Contact with Patient 04/21/15 [phone]     Chief Complaint  Patient presents with  . Abdominal Pain     (Consider location/radiation/quality/duration/timing/severity/associated sxs/prior Treatment) The history is provided by the patient and medical records.   This is a 39 year old female with past medical history significant for asthma and anxiety, presenting to the ED for left lower abdominal pain. Patient states pain began abruptly last night. Pain remains localized to her left lower abdomen and groin. She reports associated nausea which she attributes to pain. She denies any vomiting or diarrhea. No fever or chills. No flank pain, urinary symptoms, vaginal bleeding, or vaginal discharge. Patient does have a history of ovarian cysts. States pain is similar, however is lasting longer in duration than normal. She is not seen her OB/GYN in a few years, last routine screening with PAP was in 2012. Patient is status post tubal ligation.  VSS.   Past Medical History  Diagnosis Date  . Asthma   . Anxiety    Past Surgical History  Procedure Laterality Date  . Cholecystectomy    . Tubal ligation     No family history on file. History  Substance Use Topics  . Smoking status: Current Some Day Smoker -- 0.00 packs/day    Types: Cigarettes  . Smokeless tobacco: Not on file  . Alcohol Use: No   OB History    No data available     Review of Systems  Gastrointestinal: Positive for nausea and abdominal pain.  All other systems reviewed and are negative.     Allergies  Amoxicillin; Erythromycin; and Penicillins  Home Medications   Prior to Admission medications   Medication Sig Start Date End Date Taking? Authorizing Provider  acetaminophen (TYLENOL) 650 MG CR tablet Take 650 mg by mouth every 8 (eight) hours as needed.      Historical Provider, MD  albuterol (PROVENTIL HFA) 108 (90 BASE) MCG/ACT  inhaler Inhale 2 puffs into the lungs every 4 (four) hours as needed.      Historical Provider, MD  clonazePAM (KLONOPIN) 0.5 MG tablet Take 1 tablet (0.5 mg total) by mouth daily as needed. 09/26/12   Carolin Guernsey, MD  fluconazole (DIFLUCAN) 150 MG tablet Take 150 mg by mouth once.      Historical Provider, MD  flunisolide (NASAREL) 29 MCG/ACT (0.025%) nasal spray Place 2 sprays into the nose 2 (two) times daily. Dose is for each nostril. 12/20/11   Alveda Reasons, MD  hydrocortisone (HEMORRHOIDAL-HC) 25 MG suppository Place 25 mg rectally 2 (two) times daily.      Historical Provider, MD  ibuprofen (ADVIL,MOTRIN) 800 MG tablet Take 800 mg by mouth every 6 (six) hours as needed.      Historical Provider, MD  montelukast (SINGULAIR) 10 MG tablet Take 10 mg by mouth daily.      Historical Provider, MD  traMADol (ULTRAM) 50 MG tablet Take 50 mg by mouth every 8 (eight) hours as needed.      Historical Provider, MD   BP 110/61 mmHg  Pulse 64  SpO2 96%  LMP 04/14/2015   Physical Exam  Constitutional: She is oriented to person, place, and time. She appears well-developed and well-nourished. No distress.  HENT:  Head: Normocephalic and atraumatic.  Mouth/Throat: Oropharynx is clear and moist.  Eyes: Conjunctivae and EOM are normal. Pupils are equal, round, and reactive to light.  Neck: Normal range of motion. Neck supple.  Cardiovascular: Normal rate, regular rhythm and normal heart sounds.   Pulmonary/Chest: Effort normal and breath sounds normal. No respiratory distress. She has no wheezes.  Abdominal: Soft. Bowel sounds are normal. There is tenderness. There is no guarding.    Genitourinary: There is no lesion on the right labia. There is no lesion on the left labia. Cervix exhibits no motion tenderness. Right adnexum displays no tenderness. Left adnexum displays tenderness. No bleeding in the vagina. No foreign body around the vagina. No vaginal discharge found.  Normal female external  genitalia without visible lesions; no vaginal discharge or bleeding; cervical os closed, no friability; left adnexal tenderness without appreciable mass or fullness; right adnexa non-tender; no CMT  Musculoskeletal: Normal range of motion.  Neurological: She is alert and oriented to person, place, and time.  Skin: Skin is warm and dry. She is not diaphoretic.  Psychiatric: She has a normal mood and affect.  Nursing note and vitals reviewed.   ED Course  Procedures (including critical care time) Labs Review Labs Reviewed  WET PREP, GENITAL - Abnormal; Notable for the following:    WBC, Wet Prep HPF POC FEW (*)    All other components within normal limits  COMPREHENSIVE METABOLIC PANEL - Abnormal; Notable for the following:    Glucose, Bld 104 (*)    Calcium 8.8 (*)    All other components within normal limits  CBC WITH DIFFERENTIAL/PLATELET - Abnormal; Notable for the following:    WBC 10.6 (*)    Neutro Abs 7.9 (*)    All other components within normal limits  URINALYSIS, ROUTINE W REFLEX MICROSCOPIC (NOT AT [facility])  POC URINE PREG, ED  GC/CHLAMYDIA PROBE AMP ([place]) NOT AT [facility]    Imaging Review US Transvaginal Non-ob  04/21/2015   CLINICAL DATA:  39 year old female with left lower quadrant pain for 1 day. Initial encounter. LMP 04/12/2015  EXAM: TRANSABDOMINAL AND TRANSVAGINAL ULTRASOUND OF PELVIS  DOPPLER ULTRASOUND OF OVARIES  TECHNIQUE: Both transabdominal and transvaginal ultrasound examinations of the pelvis were performed. Transabdominal technique was performed for global imaging of the pelvis including uterus, ovaries, adnexal regions, and pelvic cul-de-sac.  It was necessary to proceed with endovaginal exam following the transabdominal exam to visualize the ovaries. Color and duplex Doppler ultrasound was utilized to evaluate blood flow to the ovaries.  COMPARISON:  Pelvis ultrasound 10/13/2007. CT Abdomen and Pelvis 06/25/2003  FINDINGS: Uterus  Measurements: 9.4 x 4.2 x  5.8 cm. No fibroids or other mass visualized.  Endometrium  Thickness: 9 mm.  No focal abnormality visualized.  Right ovary  Measurements: 3.9 x 2.9 x 3.1 cm. 1.5 cm simple cyst, physiologic in this age group. Normal appearance/no adnexal mass.  Left ovary  Measurements: 7.3 x 3.7 x 6.6 cm. There are 2 rounded hyperechoic lesions, measuring 3.5-3.9 cm diameter each (image 58). There is some associated edge shadowing of both. These do not appear hypervascular on color Doppler. There is a small superimposed 17 mm simple cyst.  Pulsed Doppler evaluation of both ovaries demonstrates normal low-resistance arterial and venous waveforms.  Other findings  Trace simple appearing free fluid.  IMPRESSION: 1. Abnormal left ovary, with no evidence of ovarian torsion, but with 2 hyperechoic heterogeneous masses each 3.5-4 cm diameter. Sonographic appearance favors ovarian dermoid(s). These are new since 2008. Recommend GYN Surgery consultation. 2. Otherwise negative pelvis.  No evidence of ovarian torsion.   Electronically Signed   By: Herminio Heads.D.  On: 04/21/2015 09:48   US Pelvis Complete  04/21/2015   CLINICAL DATA:  39 year old female with left lower quadrant pain for 1 day. Initial encounter. LMP 04/12/2015  EXAM: TRANSABDOMINAL AND TRANSVAGINAL ULTRASOUND OF PELVIS  DOPPLER ULTRASOUND OF OVARIES  TECHNIQUE: Both transabdominal and transvaginal ultrasound examinations of the pelvis were performed. Transabdominal technique was performed for global imaging of the pelvis including uterus, ovaries, adnexal regions, and pelvic cul-de-sac.  It was necessary to proceed with endovaginal exam following the transabdominal exam to visualize the ovaries. Color and duplex Doppler ultrasound was utilized to evaluate blood flow to the ovaries.  COMPARISON:  Pelvis ultrasound 10/13/2007. CT Abdomen and Pelvis 06/25/2003  FINDINGS: Uterus  Measurements: 9.4 x 4.2 x 5.8 cm. No fibroids or other mass visualized.  Endometrium  Thickness: 9  mm.  No focal abnormality visualized.  Right ovary  Measurements: 3.9 x 2.9 x 3.1 cm. 1.5 cm simple cyst, physiologic in this age group. Normal appearance/no adnexal mass.  Left ovary  Measurements: 7.3 x 3.7 x 6.6 cm. There are 2 rounded hyperechoic lesions, measuring 3.5-3.9 cm diameter each (image 58). There is some associated edge shadowing of both. These do not appear hypervascular on color Doppler. There is a small superimposed 17 mm simple cyst.  Pulsed Doppler evaluation of both ovaries demonstrates normal low-resistance arterial and venous waveforms.  Other findings  Trace simple appearing free fluid.  IMPRESSION: 1. Abnormal left ovary, with no evidence of ovarian torsion, but with 2 hyperechoic heterogeneous masses each 3.5-4 cm diameter. Sonographic appearance favors ovarian dermoid(s). These are new since 2008. Recommend GYN Surgery consultation. 2. Otherwise negative pelvis.  No evidence of ovarian torsion.   Electronically Signed   By: Genevie Ann M.D.   On: 04/21/2015 09:48   [place] Art/ven Flow Abd Pelv Doppler  04/21/2015   CLINICAL DATA:  39 year old female with left lower quadrant pain for 1 day. Initial encounter. LMP 04/12/2015  EXAM: TRANSABDOMINAL AND TRANSVAGINAL ULTRASOUND OF PELVIS  DOPPLER ULTRASOUND OF OVARIES  TECHNIQUE: Both transabdominal and transvaginal ultrasound examinations of the pelvis were performed. Transabdominal technique was performed for global imaging of the pelvis including uterus, ovaries, adnexal regions, and pelvic cul-de-sac.  It was necessary to proceed with endovaginal exam following the transabdominal exam to visualize the ovaries. Color and duplex Doppler ultrasound was utilized to evaluate blood flow to the ovaries.  COMPARISON:  Pelvis ultrasound 10/13/2007. CT Abdomen and Pelvis 06/25/2003  FINDINGS: Uterus  Measurements: 9.4 x 4.2 x 5.8 cm. No fibroids or other mass visualized.  Endometrium  Thickness: 9 mm.  No focal abnormality visualized.  Right ovary   Measurements: 3.9 x 2.9 x 3.1 cm. 1.5 cm simple cyst, physiologic in this age group. Normal appearance/no adnexal mass.  Left ovary  Measurements: 7.3 x 3.7 x 6.6 cm. There are 2 rounded hyperechoic lesions, measuring 3.5-3.9 cm diameter each (image 58). There is some associated edge shadowing of both. These do not appear hypervascular on color Doppler. There is a small superimposed 17 mm simple cyst.  Pulsed Doppler evaluation of both ovaries demonstrates normal low-resistance arterial and venous waveforms.  Other findings  Trace simple appearing free fluid.  IMPRESSION: 1. Abnormal left ovary, with no evidence of ovarian torsion, but with 2 hyperechoic heterogeneous masses each 3.5-4 cm diameter. Sonographic appearance favors ovarian dermoid(s). These are new since 2008. Recommend GYN Surgery consultation. 2. Otherwise negative pelvis.  No evidence of ovarian torsion.   Electronically Signed   By: Herminio Heads.D.  On: 04/21/2015 09:48     EKG Interpretation None      MDM   Final diagnoses:  Left sided abdominal pain  Multiple developmental ovarian cysts   39 year old female here with left lower quadrant pain extending into the groin. She does have history of ovarian cyst. Patient is afebrile, nontoxic. Left lower pelvic region is tender to palpation, remainder of exam is benign. Pelvic exam was performed, left adnexal tenderness. No noted discharge or bleeding. No cervical motion tenderness. Lab work reassuring.  U/a non-infectious.  Pelvic ultrasound was obtained revealing abnormal left ovary with suspicion of dermoid cyst. There is no evidence of ovarian torsion at this time. Patient has not seen an OB/GYN in several years, she was instructed to follow-up closely. She was given information for [facility], encouraged to schedule an appointment as soon as possible. She'll be provided with pain medication for home.  Discussed plan with patient, he/she acknowledged understanding and agreed with  plan of care.  Return precautions given for new or worsening symptoms.  Larene Pickett, PA-C 04/21/15 [place], DO 04/29/15 [phone]

## 2015-04-21 NOTE — Discharge Instructions (Signed)
Take the prescribed medication as directed. [facility]-- i recommend calling today to schedule appt as soon as possible. Return to the ED for new or worsening symptoms.

## 2015-04-21 NOTE — ED Notes (Signed)
PT transported to [place] without distress.

## 2015-04-21 NOTE — ED Notes (Signed)
Patient transported to CT 

## 2015-04-22 NOTE — Discharge Instructions (Signed)
Ovarian Cyst An ovarian cyst is a fluid-filled sac that forms on an ovary. The ovaries are small organs that produce eggs in women. Various types of cysts can form on the ovaries. Most are not cancerous. Many do not cause problems, and they often go away on their own. Some may cause symptoms and require treatment. Common types of ovarian cysts include:  Functional cysts--These cysts may occur every month during the menstrual cycle. This is normal. The cysts usually go away with the next menstrual cycle if the woman does not get pregnant. Usually, there are no symptoms with a functional cyst.  Endometrioma cysts--These cysts form from the tissue that lines the uterus. They are also called "chocolate cysts" because they become filled with blood that turns brown. This type of cyst can cause pain in the lower abdomen during intercourse and with your menstrual period.  Cystadenoma cysts--This type develops from the cells on the outside of the ovary. These cysts can get very big and cause lower abdomen pain and pain with intercourse. This type of cyst can twist on itself, cut off its blood supply, and cause severe pain. It can also easily rupture and cause a lot of pain.  Dermoid cysts--This type of cyst is sometimes found in both ovaries. These cysts may contain different kinds of body tissue, such as skin, teeth, hair, or cartilage. They usually do not cause symptoms unless they get very big.  Theca lutein cysts--These cysts occur when too much of a certain hormone (human chorionic gonadotropin) is produced and overstimulates the ovaries to produce an egg. This is most common after procedures used to assist with the conception of a baby (in vitro fertilization). CAUSES   Fertility drugs can cause a condition in which multiple large cysts are formed on the ovaries. This is called ovarian hyperstimulation syndrome.  A condition called polycystic ovary syndrome can cause hormonal imbalances that can lead to  nonfunctional ovarian cysts. SIGNS AND SYMPTOMS  Many ovarian cysts do not cause symptoms. If symptoms are present, they may include:  Pelvic pain or pressure.  Pain in the lower abdomen.  Pain during sexual intercourse.  Increasing girth (swelling) of the abdomen.  Abnormal menstrual periods.  Increasing pain with menstrual periods.  Stopping having menstrual periods without being pregnant. DIAGNOSIS  These cysts are commonly found during a routine or annual pelvic exam. Tests may be ordered to find out more about the cyst. These tests may include:  Ultrasound.  X-ray of the pelvis.  CT scan.  MRI.  Blood tests. TREATMENT  Many ovarian cysts go away on their own without treatment. Your health care provider may want to check your cyst regularly for 2-3 months to see if it changes. For women in menopause, it is particularly important to monitor a cyst closely because of the higher rate of ovarian cancer in menopausal women. When treatment is needed, it may include any of the following:  A procedure to drain the cyst (aspiration). This may be done using a long needle and ultrasound. It can also be done through a laparoscopic procedure. This involves using a thin, lighted tube with a tiny camera on the end (laparoscope) inserted through a small incision.  Surgery to remove the whole cyst. This may be done using laparoscopic surgery or an open surgery involving a larger incision in the lower abdomen.  Hormone treatment or birth control pills. These methods are sometimes used to help dissolve a cyst. HOME CARE INSTRUCTIONS   Only take over-the-counter   or prescription medicines as directed by your health care provider.  Follow up with your health care provider as directed.  Get regular pelvic exams and Pap tests. SEEK MEDICAL CARE IF:   Your periods are late, irregular, or painful, or they stop.  Your pelvic pain or abdominal pain does not go away.  Your abdomen becomes  larger or swollen.  You have pressure on your bladder or trouble emptying your bladder completely.  You have pain during sexual intercourse.  You have feelings of fullness, pressure, or discomfort in your stomach.  You lose weight for no apparent reason.  You feel generally ill.  You become constipated.  You lose your appetite.  You develop acne.  You have an increase in body and facial hair.  You are gaining weight, without changing your exercise and eating habits.  You think you are pregnant. SEEK IMMEDIATE MEDICAL CARE IF:   You have increasing abdominal pain.  You feel sick to your stomach (nauseous), and you throw up (vomit).  You develop a fever that comes on suddenly.  You have abdominal pain during a bowel movement.  Your menstrual periods become heavier than usual. MAKE SURE YOU:  Understand these instructions.  Will watch your condition.  Will get help right away if you are not doing well or get worse. Document Released: 10/22/2005 Document Revised: 10/27/2013 Document Reviewed: 06/29/2013 ExitCare Patient Information 2015 [employer]. This information is not intended to replace advice given to you by your health care provider. Make sure you discuss any questions you have with your health care provider.  

## 2015-05-06 ENCOUNTER — Encounter: Payer: Self-pay | Admitting: Obstetrics & Gynecology

## 2015-05-11 ENCOUNTER — Ambulatory Visit (INDEPENDENT_AMBULATORY_CARE_PROVIDER_SITE_OTHER): Payer: BLUE CROSS/BLUE SHIELD | Admitting: Obstetrics and Gynecology

## 2015-05-11 ENCOUNTER — Encounter: Payer: Self-pay | Admitting: Obstetrics and Gynecology

## 2015-05-11 VITALS — BP 100/42 | HR 63 | Ht 67.0 in | Wt 212.0 lb

## 2015-05-11 DIAGNOSIS — N83202 Unspecified ovarian cyst, left side: Secondary | ICD-10-CM

## 2015-05-11 DIAGNOSIS — N8320 Unspecified ovarian cysts: Secondary | ICD-10-CM

## 2015-05-11 NOTE — Progress Notes (Signed)
Patient ID: Sara Booth, female   DOB: 06/08/1976, 39 y.o.   MRN: [id number] 38 yo [id number] presenting today as an ED follow up for the evaluation of ovarian cyst. Patient reports complete resolution of her pain since her 04/21/2015 ED visit. She denies any complaints. She reports a long standing h/o ovarian cysts which were previously controlled with OCP.   Past Medical History  Diagnosis Date  . Asthma   . Anxiety    Past Surgical History  Procedure Laterality Date  . Cholecystectomy    . Tubal ligation     Family History  Problem Relation Age of Onset  . Cancer Maternal Grandmother     breast   History  Substance Use Topics  . Smoking status: Current Some Day Smoker -- 0.00 packs/day    Types: Cigarettes  . Smokeless tobacco: Not on file  . Alcohol Use: No   ROS See pertinent in HPI  Blood pressure 100/42, pulse 63, height 5\' 7"  (1.702 m), weight 212 lb (96.163 kg), last menstrual period 05/08/2015.  GENERAL: Well-developed, well-nourished female in no acute distress.  ABDOMEN: Soft, nontender, nondistended. No organomegaly. PELVIC: Normal external female genitalia. Vagina is pink and rugated.  Normal discharge. Normal appearing cervix. Uterus is normal in size. No adnexal mass or tenderness. EXTREMITIES: No cyanosis, clubbing, or edema, 2+ distal pulses.  04/21/2015 Ultrasound FINDINGS: Uterus  Measurements: 8.4 x 4.8 x 5.6 cm. No fibroids or other mass visualized. The uterus is retroverted.  Endometrium  Thickness: 0.5 cm. No focal abnormality visualized.  Right ovary  Measurements: 4.2 x 2.8 x 3.1 cm. Normal appearance/no adnexal mass.  Left ovary  Measurements: 7.7 x 4.4 x 6.2 cm. Two rounded heterogeneous hyperechoic lesions are again noted within the left ovary, measuring up to 3.3 cm in size.  Pulsed Doppler evaluation of both ovaries demonstrates normal low-resistance arterial and venous waveforms.  Other findings  A moderate amount of  free fluid is noted within the pelvis. This is particulate in nature, raising concern for some degree of hemorrhage.  IMPRESSION: 1. New moderate amount of free fluid within the pelvis is particulate in nature, raising concern for some degree of hemorrhage. This may reflect hemorrhage from one of the heterogeneous hyperechoic masses within the left ovary, which measure up to 3.3 cm in size. Hemorrhage is new from the study performed this morning. GYN Surgery consultation is recommended. 2. No evidence for ovarian torsion. The uterus is unremarkable in appearance.  These results were called by telephone at the time of interpretation on 04/21/2015 at 10:49 pm to Dr. Addison Lank, who verbally acknowledged these results.  04/21/15 CT abd/pelvis FINDINGS: Atelectasis in the lung bases. Surgical absence of the gallbladder. The liver, spleen, pancreas, adrenal glands, abdominal aorta, inferior vena cava, and retroperitoneal lymph nodes are unremarkable. Subcentimeter lesions in the left kidney probably representing cysts. No hydronephrosis in either kidney. Retro aortic left renal vein. The stomach, small bowel, and colon are not abnormally distended. No free air or free fluid in the abdomen. Abdominal wall musculature appears intact.  Pelvis: The appendix is normal. Small to moderate free fluid in the pelvis with increased density consistent with hemorrhagic fluid. This may be physiologic. Infection is also possible. Two pelvic lesions posteriorly, presumably ovarian, demonstrate multiple densities including fat consistent with dermoid cysts. Uterus is not enlarged. Bladder wall is not thickened. No significant pelvic lymphadenopathy. No destructive bone lesions.  IMPRESSION: Moderate complex free fluid in the pelvis suggesting hemorrhage or infection. Bilateral ovarian  lesions containing fat consistent with dermoid cysts.   A/P 39 yo with left ovarian cyst suspicious for  dermoid cyst on CT - Discussed surgical management with laparoscopic cystectomy, possible oophorectomy. Patient with previous laparoscopy and also discussed possible laparotomy if too much scar tissue. Risks, benefits and alternatives were explained including but not limited to risks of bleeding, infection and damage to adjacent organs. Patient verbalized understanding and wishes to proceed with surgical management - Patient will be scheduled for surgery pending repeat ultrasound

## 2015-05-12 ENCOUNTER — Telehealth: Payer: Self-pay | Admitting: *Deleted

## 2015-05-12 NOTE — Telephone Encounter (Signed)
Patient left yesterday before having ultrasound scheduled. Dr. Elly Modena would like patient to have it done around the end of July. Called patient to ask about what is a good time for and left message to call us back.

## 2015-05-13 NOTE — Telephone Encounter (Signed)
Called patient and asked what time of day she prefers appts so we can get an ultrasound scheduled. Patient requests appt around 8am. Scheduled appt for 7/27 @ 8am. Informed patient. Patient verbalized understanding and had no questions

## 2015-05-15 ENCOUNTER — Emergency Department (HOSPITAL_COMMUNITY)
Admission: EM | Admit: 2015-05-15 | Discharge: 2015-05-16 | Disposition: A | Discharge status: Home / Self Care | Payer: Medicaid Other | Source: Home / Self Care | Attending: Emergency Medicine | Admitting: Emergency Medicine

## 2015-05-15 ENCOUNTER — Encounter (HOSPITAL_COMMUNITY): Payer: Self-pay | Admitting: *Deleted

## 2015-05-15 DIAGNOSIS — Z7951 Long term (current) use of inhaled steroids: Secondary | ICD-10-CM

## 2015-05-15 DIAGNOSIS — R51 Headache: Principal | ICD-10-CM

## 2015-05-15 DIAGNOSIS — Z720 Tobacco use: Secondary | ICD-10-CM

## 2015-05-15 DIAGNOSIS — Z8659 Personal history of other mental and behavioral disorders: Secondary | ICD-10-CM | POA: Diagnosis not present

## 2015-05-15 DIAGNOSIS — J45909 Unspecified asthma, uncomplicated: Secondary | ICD-10-CM

## 2015-05-15 DIAGNOSIS — Z880 Allergy status to penicillin: Secondary | ICD-10-CM

## 2015-05-15 DIAGNOSIS — R519 Headache, unspecified: Secondary | ICD-10-CM

## 2015-05-15 MED ORDER — METOCLOPRAMIDE HCL 5 MG/ML IJ SOLN
10.0000 mg | Freq: Once | INTRAMUSCULAR | Status: AC
  Administered 2015-05-15: 10 mg via INTRAVENOUS
  Filled 2015-05-15: qty 2

## 2015-05-15 MED ORDER — KETOROLAC TROMETHAMINE 30 MG/ML IJ SOLN
30.0000 mg | Freq: Once | INTRAMUSCULAR | Status: AC
  Administered 2015-05-15: 30 mg via INTRAVENOUS
  Filled 2015-05-15: qty 1

## 2015-05-15 MED ORDER — SODIUM CHLORIDE 0.9 % IV BOLUS (SEPSIS)
1000.0000 mL | Freq: Once | INTRAVENOUS | Status: AC
  Administered 2015-05-15: 1000 mL via INTRAVENOUS

## 2015-05-15 MED ORDER — DIPHENHYDRAMINE HCL 50 MG/ML IJ SOLN
25.0000 mg | Freq: Once | INTRAMUSCULAR | Status: AC
  Administered 2015-05-15: 25 mg via INTRAVENOUS
  Filled 2015-05-15: qty 1

## 2015-05-15 NOTE — ED Provider Notes (Signed)
CSN: [id number]     Arrival date & time 05/15/15  2203 History   First MD Initiated Contact with Patient 05/15/15 2226     Chief Complaint  Patient presents with  . Headache     (Consider location/radiation/quality/duration/timing/severity/associated sxs/prior Treatment) HPI Comments: Patient presents today with a headache.  Headache has been intermittent for the past 2 days.  She reports that the headache is located bilateral temples and the top of the head.  Pain gradual in onset.  She states that the pain is gradually worsening.  She has taken Sudafed, Ibuprofen, and Tylenol for the headache without improvement.  She reports associated photophobia and one episode of vomiting just prior to arrival.  She denies any acute head injury or trauma.  She denies vision changes, fever, chills, neck pain/stiffness, nasal congestion, or cough.  The history is provided by the patient.    Past Medical History  Diagnosis Date  . Asthma   . Anxiety    Past Surgical History  Procedure Laterality Date  . Cholecystectomy    . Tubal ligation     Family History  Problem Relation Age of Onset  . Cancer Maternal Grandmother     breast   History  Substance Use Topics  . Smoking status: Current Some Day Smoker -- 0.00 packs/day    Types: Cigarettes  . Smokeless tobacco: Not on file  . Alcohol Use: No   OB History    Gravida Para Term Preterm AB TAB SAB Ectopic Multiple Living   5 4 4  1  1   4      Review of Systems  All other systems reviewed and are negative.     Allergies  Amoxicillin; Erythromycin; and Penicillins  Home Medications   Prior to Admission medications   Medication Sig Start Date End Date Taking? Authorizing Provider  acetaminophen (TYLENOL) 325 MG tablet Take 650 mg by mouth every 6 (six) hours as needed for mild pain or headache.    Historical Provider, MD  fluticasone (FLONASE) 50 MCG/ACT nasal spray Place 1 spray into both nostrils daily.    Historical Provider,  MD  ibuprofen (ADVIL,MOTRIN) 200 MG tablet Take 400 mg by mouth daily as needed for headache or moderate pain (swelling).    Historical Provider, MD  ondansetron (ZOFRAN ODT) 4 MG disintegrating tablet Take 1 tablet (4 mg total) by mouth every 8 (eight) hours as needed for nausea. Patient not taking: Reported on 05/11/2015 04/21/15   Larene Pickett, PA-C  oxyCODONE-acetaminophen (PERCOCET/ROXICET) 5-325 MG per tablet Take 1 tablet by mouth every 4 (four) hours as needed. Patient not taking: Reported on 05/11/2015 04/21/15   Larene Pickett, PA-C   BP 119/63 mmHg  Pulse 63  Temp(Src) 97.7 F (36.5 C) (Oral)  Resp 18  SpO2 96%  LMP 05/08/2015 (Exact Date) Physical Exam  Constitutional: She appears well-developed and well-nourished.  HENT:  Head: Normocephalic and atraumatic.  Mouth/Throat: Oropharynx is clear and moist.  Eyes: EOM are normal. Pupils are equal, round, and reactive to light.  Neck: Normal range of motion. Neck supple.  Cardiovascular: Normal rate, regular rhythm and normal heart sounds.   Pulmonary/Chest: Effort normal and breath sounds normal.  Abdominal: Soft. Bowel sounds are normal.  Musculoskeletal: Normal range of motion.  Neurological: She is alert. She has normal strength. No cranial nerve deficit or sensory deficit. Coordination and gait normal.  Normal finger to nose testing Normal gait, no ataxia   Skin: Skin is warm and dry.  Psychiatric: She has a normal mood and affect.  Nursing note and vitals reviewed.   ED Course  Procedures (including critical care time) Labs Review Labs Reviewed - No data to display  Imaging Review No results found.   EKG Interpretation None     12:19 AM Patient reports that headache has significantly improved at this time. MDM   Final diagnoses:  None   Pt HA treated and improved while in ED.  Presentation is like pts typical HA and non concerning for [facility], ICH, Meningitis, or temporal arteritis. Pt is afebrile with no  focal neuro deficits, nuchal rigidity, or change in vision. Pt is to follow up with PCP to discuss prophylactic medication. Pt verbalizes understanding and is agreeable with plan to dc.  Return precautions given.      Hyman Bible, PA-C 05/16/15 0023  Pamella Pert, MD 05/16/15 [phone]

## 2015-05-15 NOTE — ED Notes (Signed)
Pt states that she began having a headache on Friday; pt states that she has tried over the counter medications with no relief; pt states that she vomited on the way to the ER; pt states that her head hurts worse with movement; pt denies blurry vision but states that she has photsensitivity

## 2015-05-16 ENCOUNTER — Encounter (HOSPITAL_COMMUNITY): Payer: Self-pay

## 2015-05-16 ENCOUNTER — Emergency Department (HOSPITAL_COMMUNITY)
Admission: EM | Admit: 2015-05-16 | Discharge: 2015-05-17 | Disposition: A | Discharge status: Home / Self Care | Payer: Medicaid Other | Attending: Emergency Medicine | Admitting: Emergency Medicine

## 2015-05-16 DIAGNOSIS — Z7951 Long term (current) use of inhaled steroids: Secondary | ICD-10-CM | POA: Insufficient documentation

## 2015-05-16 DIAGNOSIS — Z8659 Personal history of other mental and behavioral disorders: Secondary | ICD-10-CM | POA: Insufficient documentation

## 2015-05-16 DIAGNOSIS — Z720 Tobacco use: Secondary | ICD-10-CM | POA: Insufficient documentation

## 2015-05-16 DIAGNOSIS — R51 Headache: Secondary | ICD-10-CM | POA: Insufficient documentation

## 2015-05-16 DIAGNOSIS — J45909 Unspecified asthma, uncomplicated: Secondary | ICD-10-CM | POA: Insufficient documentation

## 2015-05-16 DIAGNOSIS — Z880 Allergy status to penicillin: Secondary | ICD-10-CM | POA: Insufficient documentation

## 2015-05-16 DIAGNOSIS — R519 Headache, unspecified: Secondary | ICD-10-CM

## 2015-05-16 MED ORDER — KETOROLAC TROMETHAMINE 30 MG/ML IJ SOLN
30.0000 mg | Freq: Once | INTRAMUSCULAR | Status: AC
  Administered 2015-05-17: 30 mg via INTRAVENOUS
  Filled 2015-05-16: qty 1

## 2015-05-16 MED ORDER — SODIUM CHLORIDE 0.9 % IV BOLUS (SEPSIS)
1000.0000 mL | Freq: Once | INTRAVENOUS | Status: AC
  Administered 2015-05-17: 1000 mL via INTRAVENOUS

## 2015-05-16 MED ORDER — METOCLOPRAMIDE HCL 5 MG/ML IJ SOLN
10.0000 mg | Freq: Once | INTRAMUSCULAR | Status: AC
  Administered 2015-05-17: 10 mg via INTRAVENOUS
  Filled 2015-05-16: qty 2

## 2015-05-16 MED ORDER — DIPHENHYDRAMINE HCL 50 MG/ML IJ SOLN
25.0000 mg | Freq: Once | INTRAMUSCULAR | Status: AC
  Administered 2015-05-17: 25 mg via INTRAVENOUS
  Filled 2015-05-16: qty 1

## 2015-05-16 NOTE — ED Provider Notes (Signed)
CSN: [id number]     Arrival date & time 05/16/15  1922 History   First MD Initiated Contact with Patient 05/16/15 2257     Chief Complaint  Patient presents with  . Headache     (Consider location/radiation/quality/duration/timing/severity/associated sxs/prior Treatment) HPI Comments: Patient presents to the emergency department with chief complaint of headache. She states that she was seen yesterday for the same. She states the headache has been present for the past 3 days now. States that she was treated with migraine cocktail yesterday and had significant relief. She states that when she woke morning however, the symptoms returned. She denies any difficulty moving her neck. Denies any changes in her vision. There are no aggravating or alleviating factors. She states that she has had some sinus congestion. She denies any fevers, chills, ataxia. She has had some nausea and vomiting. She reports that she has had a cluster headache in the past, but denies any significant history of migraines.  The history is provided by the patient. No language interpreter was used.    Past Medical History  Diagnosis Date  . Asthma   . Anxiety    Past Surgical History  Procedure Laterality Date  . Cholecystectomy    . Tubal ligation     Family History  Problem Relation Age of Onset  . Cancer Maternal Grandmother     breast   History  Substance Use Topics  . Smoking status: Current Some Day Smoker -- 0.00 packs/day    Types: Cigarettes  . Smokeless tobacco: Not on file  . Alcohol Use: No   OB History    Gravida Para Term Preterm AB TAB SAB Ectopic Multiple Living   5 4 4  1  1   4      Review of Systems  Constitutional: Negative for fever and chills.  Respiratory: Negative for shortness of breath.   Cardiovascular: Negative for chest pain.  Gastrointestinal: Negative for nausea, vomiting, diarrhea and constipation.  Genitourinary: Negative for dysuria.  Neurological: Positive for  headaches.      Allergies  Amoxicillin; Erythromycin; and Penicillins  Home Medications   Prior to Admission medications   Medication Sig Start Date End Date Taking? Authorizing Provider  acetaminophen (TYLENOL) 325 MG tablet Take 650 mg by mouth every 6 (six) hours as needed for mild pain or headache.   Yes Historical Provider, MD  fluticasone (FLONASE) 50 MCG/ACT nasal spray Place 1 spray into both nostrils daily.   Yes Historical Provider, MD  ibuprofen (ADVIL,MOTRIN) 200 MG tablet Take 400 mg by mouth daily as needed for headache or moderate pain (swelling).   Yes Historical Provider, MD  ondansetron (ZOFRAN ODT) 4 MG disintegrating tablet Take 1 tablet (4 mg total) by mouth every 8 (eight) hours as needed for nausea. Patient not taking: Reported on 05/11/2015 04/21/15   Larene Pickett, PA-C  oxyCODONE-acetaminophen (PERCOCET/ROXICET) 5-325 MG per tablet Take 1 tablet by mouth every 4 (four) hours as needed. Patient not taking: Reported on 05/11/2015 04/21/15   Larene Pickett, PA-C   BP 115/60 mmHg  Pulse 70  Temp(Src) 98.2 F (36.8 C) (Oral)  Resp 18  SpO2 100%  LMP 05/08/2015 (Exact Date) Physical Exam  Constitutional: She is oriented to person, place, and time. She appears well-developed and well-nourished.  HENT:  Head: Normocephalic and atraumatic.  Right Ear: External ear normal.  Left Ear: External ear normal.  Eyes: Conjunctivae and EOM are normal. Pupils are equal, round, and reactive to light.  Neck:  Normal range of motion. Neck supple.  No pain with neck flexion, no meningismus  Cardiovascular: Normal rate, regular rhythm and normal heart sounds.  Exam reveals no gallop and no friction rub.   No murmur heard. Pulmonary/Chest: Effort normal and breath sounds normal. No respiratory distress. She has no wheezes. She has no rales. She exhibits no tenderness.  Abdominal: Soft. She exhibits no distension and no mass. There is no tenderness. There is no rebound and no  guarding.  Musculoskeletal: Normal range of motion. She exhibits no edema or tenderness.  Normal gait.  Neurological: She is alert and oriented to person, place, and time. She has normal reflexes.  CN 3-12 intact, normal finger to nose, no pronator drift, sensation and strength intact bilaterally.  Skin: Skin is warm and dry.  Psychiatric: She has a normal mood and affect. Her behavior is normal. Judgment and thought content normal.  Nursing note and vitals reviewed.   ED Course  Procedures (including critical care time) Labs Review Labs Reviewed - No data to display  Imaging Review Ct Head Wo Contrast  05/17/2015   CLINICAL DATA:  39 year old female with headache  EXAM: CT HEAD WITHOUT CONTRAST  TECHNIQUE: Contiguous axial images were obtained from the base of the skull through the vertex without intravenous contrast.  COMPARISON:  None.  FINDINGS: The ventricles and the sulci are appropriate in size for the patient's age. There is no intracranial hemorrhage. No midline shift or mass effect identified. The gray-white matter differentiation is preserved.  The visualized paranasal sinuses and mastoid air cells are well aerated. The calvarium is intact.  IMPRESSION: No acute intracranial pathology.   Electronically Signed   By: Anner Crete M.D.   On: 05/17/2015 01:32     EKG Interpretation None      MDM   Final diagnoses:  Headache, unspecified headache type   Patient with headache, seen yesterday for the same. Good improvement with migraine cocktail. Patient is afebrile. She has no neck stiffness. No evidence of meningitis. Patient is generally well appearing. She has had some sinus congestion, suspect that she could have some component of sinus headache, and will recommend nasal saline and zyrtec.    Pt HA treated and improved while in ED.  Presentation is like pts typical HA and non concerning for [facility], ICH, Meningitis, or temporal arteritis. Pt is afebrile with no focal neuro  deficits, nuchal rigidity, or change in vision. Pt is to follow up with PCP to discuss prophylactic medication. Pt verbalizes understanding and is agreeable with plan to dc.      Montine Circle, PA-C 05/17/15 [place], MD 05/17/15 [phone]

## 2015-05-16 NOTE — ED Notes (Signed)
Pt was seen last night for the same, she continues to complain of a headache and vomiting

## 2015-05-17 ENCOUNTER — Emergency Department (HOSPITAL_COMMUNITY): Payer: Medicaid Other

## 2015-05-17 ENCOUNTER — Encounter (HOSPITAL_COMMUNITY): Payer: Self-pay | Admitting: *Deleted

## 2015-05-17 NOTE — Discharge Instructions (Signed)

## 2015-06-01 ENCOUNTER — Ambulatory Visit (HOSPITAL_COMMUNITY): Payer: MEDICAID

## 2015-06-01 ENCOUNTER — Telehealth: Payer: Self-pay | Admitting: *Deleted

## 2015-06-01 ENCOUNTER — Ambulatory Visit (HOSPITAL_COMMUNITY)
Admission: RE | Admit: 2015-06-01 | Discharge: 2015-06-01 | Disposition: A | Discharge status: Home / Self Care | Payer: Medicaid Other | Source: Ambulatory Visit | Attending: Obstetrics and Gynecology | Admitting: Obstetrics and Gynecology

## 2015-06-01 DIAGNOSIS — R1031 Right lower quadrant pain: Secondary | ICD-10-CM | POA: Insufficient documentation

## 2015-06-01 DIAGNOSIS — N8320 Unspecified ovarian cysts: Secondary | ICD-10-CM | POA: Diagnosis not present

## 2015-06-01 DIAGNOSIS — N854 Malposition of uterus: Secondary | ICD-10-CM | POA: Diagnosis not present

## 2015-06-01 DIAGNOSIS — Q5039 Other congenital malformation of ovary: Secondary | ICD-10-CM | POA: Insufficient documentation

## 2015-06-01 DIAGNOSIS — N83202 Unspecified ovarian cyst, left side: Secondary | ICD-10-CM

## 2015-06-01 NOTE — Telephone Encounter (Signed)
Per message Dr. 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 No other anomalies seen. Plan to remove during planned surgery already scheduled.  Called [facility] and left a message I was calling with some information from her doctor and we will call again or she call office.

## 2015-06-07 NOTE — Telephone Encounter (Signed)
Called patient and discussed ultrasound results. Patient verbalized understanding and asked if she could have her other ovary removed. Told patient they will not do that without a medical reason and she could certainly talk to the doctor about that. Told patient if she has bad painful periods she may want to consider something like the mirena IUD which has been approved by the FDA for use in women who have painful or heavy periods. Patient verbalized understanding and states she will look into that. Patient had no other questions

## 2015-06-08 ENCOUNTER — Encounter (HOSPITAL_COMMUNITY): Payer: Self-pay | Admitting: *Deleted

## 2015-06-27 ENCOUNTER — Encounter (HOSPITAL_COMMUNITY): Payer: Self-pay | Admitting: Anesthesiology

## 2015-06-27 NOTE — H&P (Signed)
Sara Booth is an 39 y.o. female [phone] presenting today for the scheduled surgical management of a left ovarian dermoid cyst. Patient is currently not in any pain but this was an incidental findings and the patient desires definitive treatment. She has a long standing history of ovarian cysts. She is sexually active using BTL for contraception. She also reports dysmenorrhea but has not tried medical management.  Pertinent Gynecological History: Menses: flow is moderate Bleeding: monthly Contraception: tubal ligation DES exposure: denies Blood transfusions: none Sexually transmitted diseases: no past history Previous GYN Procedures: DNC    Menstrual History: Patient's last menstrual period was 06/06/2015.    Past Medical History  Diagnosis Date  . Asthma   . Anxiety     Past Surgical History  Procedure Laterality Date  . Cholecystectomy    . Tubal ligation      Family History  Problem Relation Age of Onset  . Cancer Maternal Grandmother     breast    Social History:  reports that she has been smoking Cigarettes.  She has been smoking about 0.00 packs per day. She does not have any smokeless tobacco history on file. She reports that she does not drink alcohol or use illicit drugs.  Allergies:  Allergies  Allergen Reactions  . Amoxicillin Other (See Comments)    Made her very hot and her lips turned blue. She also had frequent urination.  . Erythromycin     Was told not to take it in the hospital  . Penicillins Other (See Comments)    She can take any cillins    Prescriptions prior to admission  Medication Sig Dispense Refill Last Dose  . fluticasone (FLONASE) 50 MCG/ACT nasal spray Place 1 spray into both nostrils daily.   06/27/2015 at Unknown time  . ibuprofen (ADVIL,MOTRIN) 200 MG tablet Take 400 mg by mouth daily as needed for headache or moderate pain (swelling).   06/27/2015 at Unknown time  . Multiple Vitamins-Minerals (HAIR/SKIN/NAILS PO) Take 3 tablets by  mouth daily.   Past Week at Unknown time  . acetaminophen (TYLENOL) 325 MG tablet Take 650 mg by mouth every 6 (six) hours as needed for mild pain or headache.   Unknown at Unknown time  . ondansetron (ZOFRAN ODT) 4 MG disintegrating tablet Take 1 tablet (4 mg total) by mouth every 8 (eight) hours as needed for nausea. (Patient not taking: Reported on 05/11/2015) 10 tablet 0 Unknown at Unknown time  . oxyCODONE-acetaminophen (PERCOCET/ROXICET) 5-325 MG per tablet Take 1 tablet by mouth every 4 (four) hours as needed. (Patient not taking: Reported on 05/11/2015) 20 tablet 0 Not Taking    Review of Systems  All other systems reviewed and are negative.   Blood pressure 117/65, pulse 62, temperature 98.4 F (36.9 C), temperature source Oral, resp. rate 18, height 5' 7.5" (1.715 m), weight 210 lb (95.255 kg), last menstrual period 06/06/2015, SpO2 98 %. Physical Exam GENERAL: Well-developed, well-nourished female in no acute distress.  HEENT: Normocephalic, atraumatic. Sclerae anicteric.  NECK: Supple. Normal thyroid.  LUNGS: Clear to auscultation bilaterally.  HEART: Regular rate and rhythm. ABDOMEN: Soft, nontender, nondistended. No organomegaly. PELVIC: Deferred to OR EXTREMITIES: No cyanosis, clubbing, or edema, 2+ distal pulses.  Results for orders placed or performed during the hospital encounter of 06/28/15 (from the past 24 hour(s))  Pregnancy, urine     Status: None   Collection Time: 06/28/15  6:00 AM  Result Value Ref Range   Preg Test, Ur NEGATIVE NEGATIVE  Type and screen     Status: None   Collection Time: 06/28/15  6:05 AM  Result Value Ref Range   ABO/RH(D) A POS    Antibody Screen NEG    Sample Expiration 07/01/2015   CBC     Status: None   Collection Time: 06/28/15  6:05 AM  Result Value Ref Range   WBC 7.1 4.0 - 10.5 K/uL   RBC 4.74 3.87 - 5.11 MIL/uL   Hemoglobin 14.4 12.0 - 15.0 g/dL   HCT 41.8 36.0 - 46.0 %   MCV 88.2 78.0 - 100.0 fL   MCH 30.4 26.0 - 34.0 pg    MCHC 34.4 30.0 - 36.0 g/dL   RDW 13.2 11.5 - 15.5 %   Platelets 250 150 - 400 K/uL    No results found. 06/01/2015 ultrasound FINDINGS: Uterus  Measurements: 8.0 x 4.8 x 5.5 cm. Retroverted and normal in appearance.  Endometrium  Thickness: 6.5 mm. No focal abnormality visualized.  Right ovary  Measurements: 3.9 x 2.5 x 2.5 cm. Small corpus luteum is 1.8 cm. Color flow identified in the right ovary.  Left ovary  Measurements: 8.4 x 4.2 x 6.2 cm. Echogenic masses are 3.0 x 3.6 x 4.0 cm and 3.3 x 3.2 x 3.0 cm, consistent with ovarian dermoid. Color flow identified within the left ovary.  Other findings: No free pelvic fluid identified.  IMPRESSION: 1. Normal appearance of retroverted uterus. 2. Normal appearance of right ovary. 3. Persistent enlarged left ovary, containing echogenic/fatty masses consistent with ovarian dermoid.   Electronically Signed  By: Sara Booth M.D.  On: 06/01/2015 08:58 Assessment/Plan: 39 yo with a left ovarian dermoid cyst here for surgical management via laparoscopic ovarian cystectomy - Risks, benefits and alternatives were explained including but not limited to risks of bleeding, infection and damage to adjacent organs. Patient also understands the possibility of left oophorectomy, and laparotomy. All questions were answered and the patient verbalized understanding. - Discharge instructions were also provided  Sara Booth 06/28/2015, 7:13 AM

## 2015-06-27 NOTE — Anesthesia Preprocedure Evaluation (Addendum)
Anesthesia Evaluation  Patient identified by MRN, date of birth, ID band Patient awake    Reviewed: Allergy & Precautions, NPO status , Patient's Chart, lab work & pertinent test results  Airway Mallampati: II  TM Distance: >3 FB Neck ROM: Full    Dental  (+) Loose,    Pulmonary asthma , Current Smoker,  breath sounds clear to auscultation  Pulmonary exam normal       Cardiovascular negative cardio ROS Normal cardiovascular examRhythm:Regular Rate:Normal     Neuro/Psych PSYCHIATRIC DISORDERS Anxiety Depression Bipolar Disorder negative neurological ROS     GI/Hepatic Neg liver ROS, IBS   Endo/Other  Obesity  Renal/GU negative Renal ROS  negative genitourinary   Musculoskeletal negative musculoskeletal ROS (+)   Abdominal (+) + obese,   Peds  Hematology negative hematology ROS (+)   Anesthesia Other Findings   Reproductive/Obstetrics Bilateral Ovarian cysts - ? dermoid                            Anesthesia Physical Anesthesia Plan  ASA: II  Anesthesia Plan: General   Post-op Pain Management:    Induction: Intravenous  Airway Management Planned: Oral ETT  Additional Equipment:   Intra-op Plan:   Post-operative Plan: Extubation in OR  Informed Consent: I have reviewed the patients History and Physical, chart, labs and discussed the procedure including the risks, benefits and alternatives for the proposed anesthesia with the patient or authorized representative who has indicated his/her understanding and acceptance.   Dental advisory given  Plan Discussed with: CRNA, Anesthesiologist and Surgeon  Anesthesia Plan Comments:         Anesthesia Quick Evaluation

## 2015-06-28 ENCOUNTER — Encounter (HOSPITAL_COMMUNITY): Payer: Self-pay | Admitting: *Deleted

## 2015-06-28 ENCOUNTER — Ambulatory Visit (HOSPITAL_COMMUNITY)
Admission: RE | Admit: 2015-06-28 | Discharge: 2015-06-28 | Disposition: A | Discharge status: Home / Self Care | Payer: Medicaid Other | Source: Ambulatory Visit | Attending: Obstetrics and Gynecology | Admitting: Obstetrics and Gynecology

## 2015-06-28 ENCOUNTER — Ambulatory Visit (HOSPITAL_COMMUNITY): Payer: Medicaid Other | Admitting: Anesthesiology

## 2015-06-28 ENCOUNTER — Encounter (HOSPITAL_COMMUNITY)
Admission: RE | Disposition: A | Discharge status: Home / Self Care | Payer: Self-pay | Source: Ambulatory Visit | Attending: Obstetrics and Gynecology

## 2015-06-28 DIAGNOSIS — D271 Benign neoplasm of left ovary: Secondary | ICD-10-CM

## 2015-06-28 DIAGNOSIS — F17210 Nicotine dependence, cigarettes, uncomplicated: Secondary | ICD-10-CM | POA: Insufficient documentation

## 2015-06-28 DIAGNOSIS — N854 Malposition of uterus: Secondary | ICD-10-CM | POA: Insufficient documentation

## 2015-06-28 DIAGNOSIS — N946 Dysmenorrhea, unspecified: Secondary | ICD-10-CM | POA: Diagnosis not present

## 2015-06-28 DIAGNOSIS — N736 Female pelvic peritoneal adhesions (postinfective): Secondary | ICD-10-CM | POA: Insufficient documentation

## 2015-06-28 DIAGNOSIS — Z881 Allergy status to other antibiotic agents status: Secondary | ICD-10-CM | POA: Insufficient documentation

## 2015-06-28 HISTORY — PX: LAPAROSCOPIC UNILATERAL SALPINGO OOPHERECTOMY: SHX5935

## 2015-06-28 LAB — CBC
HCT: 41.8 % (ref 36.0–46.0)
HEMOGLOBIN: 14.4 g/dL (ref 12.0–15.0)
MCH: 30.4 pg (ref 26.0–34.0)
MCHC: 34.4 g/dL (ref 30.0–36.0)
MCV: 88.2 fL (ref 78.0–100.0)
PLATELETS: 250 10*3/uL (ref 150–400)
RBC: 4.74 MIL/uL (ref 3.87–5.11)
RDW: 13.2 % (ref 11.5–15.5)
WBC: 7.1 10*3/uL (ref 4.0–10.5)

## 2015-06-28 LAB — TYPE AND SCREEN
ABO/RH(D): A POS
ANTIBODY SCREEN: NEGATIVE

## 2015-06-28 LAB — ABO/RH: ABO/RH(D): A POS

## 2015-06-28 LAB — PREGNANCY, URINE: Preg Test, Ur: NEGATIVE

## 2015-06-28 SURGERY — SALPINGO-OOPHORECTOMY, UNILATERAL, LAPAROSCOPIC
Anesthesia: General | Site: Abdomen | Laterality: Left

## 2015-06-28 MED ORDER — BUPIVACAINE HCL (PF) 0.25 % IJ SOLN
INTRAMUSCULAR | Status: AC
  Filled 2015-06-28: qty 30

## 2015-06-28 MED ORDER — ONDANSETRON HCL 4 MG/2ML IJ SOLN
INTRAMUSCULAR | Status: AC
  Filled 2015-06-28: qty 2

## 2015-06-28 MED ORDER — PROPOFOL 10 MG/ML IV BOLUS
INTRAVENOUS | Status: DC | PRN
Start: 2015-06-28 — End: 2015-06-28
  Administered 2015-06-28: 200 mg via INTRAVENOUS

## 2015-06-28 MED ORDER — FENTANYL CITRATE (PF) 250 MCG/5ML IJ SOLN
INTRAMUSCULAR | Status: AC
  Filled 2015-06-28: qty 25

## 2015-06-28 MED ORDER — FENTANYL CITRATE (PF) 100 MCG/2ML IJ SOLN
25.0000 ug | INTRAMUSCULAR | Status: DC | PRN
  Administered 2015-06-28: 25 ug via INTRAVENOUS
  Administered 2015-06-28: 50 ug via INTRAVENOUS

## 2015-06-28 MED ORDER — MIDAZOLAM HCL 2 MG/2ML IJ SOLN
INTRAMUSCULAR | Status: DC | PRN
  Administered 2015-06-28: 2 mg via INTRAVENOUS

## 2015-06-28 MED ORDER — DOCUSATE SODIUM 100 MG PO CAPS: 100.0000 mg | ORAL_CAPSULE | Freq: Two times a day (BID) | ORAL | Status: DC | PRN

## 2015-06-28 MED ORDER — LIDOCAINE HCL (CARDIAC) 20 MG/ML IV SOLN
INTRAVENOUS | Status: DC | PRN
  Administered 2015-06-28: 100 mg via INTRAVENOUS

## 2015-06-28 MED ORDER — DEXAMETHASONE SODIUM PHOSPHATE 10 MG/ML IJ SOLN
INTRAMUSCULAR | Status: DC | PRN
  Administered 2015-06-28: 4 mg via INTRAVENOUS

## 2015-06-28 MED ORDER — ROCURONIUM BROMIDE 100 MG/10ML IV SOLN
INTRAVENOUS | Status: AC
  Filled 2015-06-28: qty 1

## 2015-06-28 MED ORDER — HYDROCODONE-ACETAMINOPHEN 7.5-325 MG PO TABS: 1.0000 | ORAL_TABLET | Freq: Once | ORAL | Status: DC | PRN

## 2015-06-28 MED ORDER — LACTATED RINGERS IR SOLN
Status: DC | PRN
  Administered 2015-06-28: 3000 mL

## 2015-06-28 MED ORDER — LIDOCAINE HCL (CARDIAC) 20 MG/ML IV SOLN
INTRAVENOUS | Status: AC
  Filled 2015-06-28: qty 5

## 2015-06-28 MED ORDER — GLYCOPYRROLATE 0.2 MG/ML IJ SOLN
INTRAMUSCULAR | Status: DC | PRN
  Administered 2015-06-28: 0.2 mg via INTRAVENOUS
  Administered 2015-06-28: 0.6 mg via INTRAVENOUS

## 2015-06-28 MED ORDER — LACTATED RINGERS IV SOLN
INTRAVENOUS | Status: DC
  Administered 2015-06-28: 07:00:00 via INTRAVENOUS

## 2015-06-28 MED ORDER — NEOSTIGMINE METHYLSULFATE 10 MG/10ML IV SOLN
INTRAVENOUS | Status: AC
  Filled 2015-06-28: qty 1

## 2015-06-28 MED ORDER — DEXAMETHASONE SODIUM PHOSPHATE 4 MG/ML IJ SOLN
INTRAMUSCULAR | Status: AC
  Filled 2015-06-28: qty 1

## 2015-06-28 MED ORDER — MIDAZOLAM HCL 2 MG/2ML IJ SOLN
INTRAMUSCULAR | Status: AC
  Filled 2015-06-28: qty 4

## 2015-06-28 MED ORDER — OXYCODONE-ACETAMINOPHEN 5-325 MG PO TABS: 1.0000 | ORAL_TABLET | ORAL | Status: DC | PRN

## 2015-06-28 MED ORDER — GLYCOPYRROLATE 0.2 MG/ML IJ SOLN
INTRAMUSCULAR | Status: AC
  Filled 2015-06-28: qty 4

## 2015-06-28 MED ORDER — SCOPOLAMINE 1 MG/3DAYS TD PT72
MEDICATED_PATCH | TRANSDERMAL | Status: AC
  Administered 2015-06-28: 1.5 mg via TRANSDERMAL
  Filled 2015-06-28: qty 1

## 2015-06-28 MED ORDER — ROCURONIUM BROMIDE 100 MG/10ML IV SOLN
INTRAVENOUS | Status: DC | PRN
  Administered 2015-06-28: 50 mg via INTRAVENOUS

## 2015-06-28 MED ORDER — IBUPROFEN 600 MG PO TABS: 600.0000 mg | ORAL_TABLET | Freq: Four times a day (QID) | ORAL | Status: DC | PRN

## 2015-06-28 MED ORDER — PROPOFOL 10 MG/ML IV BOLUS
INTRAVENOUS | Status: AC
  Filled 2015-06-28: qty 20

## 2015-06-28 MED ORDER — NEOSTIGMINE METHYLSULFATE 10 MG/10ML IV SOLN
INTRAVENOUS | Status: DC | PRN
  Administered 2015-06-28: 4 mg via INTRAVENOUS

## 2015-06-28 MED ORDER — MEPERIDINE HCL 25 MG/ML IJ SOLN: 6.2500 mg | INTRAMUSCULAR | Status: DC | PRN

## 2015-06-28 MED ORDER — ONDANSETRON HCL 4 MG/2ML IJ SOLN
INTRAMUSCULAR | Status: DC | PRN
  Administered 2015-06-28: 4 mg via INTRAVENOUS

## 2015-06-28 MED ORDER — FENTANYL CITRATE (PF) 100 MCG/2ML IJ SOLN
INTRAMUSCULAR | Status: AC
Start: 2015-06-28 — End: 2015-06-28
  Administered 2015-06-28: 25 ug via INTRAVENOUS
  Filled 2015-06-28: qty 2

## 2015-06-28 MED ORDER — SCOPOLAMINE 1 MG/3DAYS TD PT72
1.0000 | MEDICATED_PATCH | Freq: Once | TRANSDERMAL | Status: DC
  Administered 2015-06-28: 1.5 mg via TRANSDERMAL

## 2015-06-28 MED ORDER — FENTANYL CITRATE (PF) 100 MCG/2ML IJ SOLN
INTRAMUSCULAR | Status: DC | PRN
Start: 2015-06-28 — End: 2015-06-28
  Administered 2015-06-28: 100 ug via INTRAVENOUS
  Administered 2015-06-28: 50 ug via INTRAVENOUS
  Administered 2015-06-28: 100 ug via INTRAVENOUS

## 2015-06-28 MED ORDER — BUPIVACAINE HCL (PF) 0.25 % IJ SOLN
INTRAMUSCULAR | Status: DC | PRN
  Administered 2015-06-28: 20 mL

## 2015-06-28 MED ORDER — METOCLOPRAMIDE HCL 5 MG/ML IJ SOLN: 10.0000 mg | Freq: Once | INTRAMUSCULAR | Status: DC | PRN

## 2015-06-28 SURGICAL SUPPLY — 27 items
BAG SPEC RTRVL LRG 6X4 10 (ENDOMECHANICALS) ×2
CHLORAPREP W/TINT 26ML (MISCELLANEOUS) ×4 IMPLANT
DISSECTOR BLUNT TIP ENDO 5MM (MISCELLANEOUS) ×3 IMPLANT
DRSG COVADERM PLUS 2X2 (GAUZE/BANDAGES/DRESSINGS) ×8 IMPLANT
DRSG OPSITE POSTOP 3X4 (GAUZE/BANDAGES/DRESSINGS) ×3 IMPLANT
ELECT REM PT RETURN 9FT ADLT (ELECTROSURGICAL) ×4
ELECTRODE REM PT RTRN 9FT ADLT (ELECTROSURGICAL) ×1 IMPLANT
GLOVE BIOGEL PI IND STRL 6.5 (GLOVE) ×2 IMPLANT
GLOVE BIOGEL PI INDICATOR 6.5 (GLOVE) ×2
GLOVE SURG SS PI 6.0 STRL IVOR (GLOVE) ×4 IMPLANT
GOWN STRL REUS W/TWL LRG LVL3 (GOWN DISPOSABLE) ×8 IMPLANT
LIQUID BAND (GAUZE/BANDAGES/DRESSINGS) ×4 IMPLANT
NS IRRIG 1000ML POUR BTL (IV SOLUTION) ×4 IMPLANT
PACK LAPAROSCOPY BASIN (CUSTOM PROCEDURE TRAY) ×4 IMPLANT
PAD POSITIONING PINK XL (MISCELLANEOUS) ×4 IMPLANT
POUCH SPECIMEN RETRIEVAL 10MM (ENDOMECHANICALS) ×3 IMPLANT
SEALER TISSUE G2 CVD JAW 35 (ENDOMECHANICALS) IMPLANT
SEALER TISSUE G2 CVD JAW 45CM (ENDOMECHANICALS)
SET IRRIG TUBING LAPAROSCOPIC (IRRIGATION / IRRIGATOR) ×3 IMPLANT
SHEARS HARMONIC ACE PLUS 36CM (ENDOMECHANICALS) ×3 IMPLANT
SUT MNCRL AB 4-0 PS2 18 (SUTURE) ×4 IMPLANT
SUT VICRYL 0 UR6 27IN ABS (SUTURE) ×8 IMPLANT
TOWEL OR 17X24 6PK STRL BLUE (TOWEL DISPOSABLE) ×8 IMPLANT
TRAY FOLEY CATH SILVER 14FR (SET/KITS/TRAYS/PACK) ×4 IMPLANT
TROCAR BALLN 12MMX100 BLUNT (TROCAR) ×4 IMPLANT
TROCAR XCEL NON-BLD 5MMX100MML (ENDOMECHANICALS) ×3 IMPLANT
WATER STERILE IRR 1000ML POUR (IV SOLUTION) ×4 IMPLANT

## 2015-06-28 NOTE — Transfer of Care (Signed)
Immediate Anesthesia Transfer of Care Note  Patient: Sara Booth  Procedure(s) Performed: Procedure(s): LAPAROSCOPIC LEFT SALPINGO OOPHORECTOMY (Left)  Patient Location: PACU  Anesthesia Type:General  Level of Consciousness: awake, alert  and oriented  Airway & Oxygen Therapy: Patient Spontanous Breathing and Patient connected to nasal cannula oxygen  Post-op Assessment: Report given to RN and Post -op Vital signs reviewed and stable  Post vital signs: Reviewed and stable  Last Vitals:  Filed Vitals:   06/28/15 0610  BP: 117/65  Pulse: 62  Temp: 36.9 C  Resp: 18    Complications: No apparent anesthesia complications

## 2015-06-28 NOTE — Discharge Instructions (Signed)
Diagnostic Laparoscopy Laparoscopy is a surgical procedure. It is used to diagnose and treat diseases inside the belly (abdomen). It is usually a brief, common, and relatively simple procedure. The laparoscopeis a thin, lighted, pencil-sized instrument. It is like a telescope. It is inserted into your abdomen through a small cut (incision). Your caregiver can look at the organs inside your body through this instrument. He or she can see if there is anything abnormal. Laparoscopy can be done either in a hospital or outpatient clinic. You may be given a mild sedative to help you relax before the procedure. Once in the operating room, you will be given a drug to make you sleep (general anesthesia). Laparoscopy usually lasts less than 1 hour. After the procedure, you will be monitored in a recovery area until you are stable and doing well. Once you are home, it will take 2 to 3 days to fully recover. RISKS AND COMPLICATIONS  Laparoscopy has relatively few risks. Your caregiver will discuss the risks with you before the procedure. Some problems that can occur include:  Infection.  Bleeding.  Damage to other organs.  Anesthetic side effects. PROCEDURE Once you receive anesthesia, your surgeon inflates the abdomen with a harmless gas (carbon dioxide). This makes the organs easier to see. The laparoscope is inserted into the abdomen through a small incision. This allows your surgeon to see into the abdomen. Other small instruments are also inserted into the abdomen through other small openings. Many surgeons attach a video camera to the laparoscope to enlarge the view. During a diagnostic laparoscopy, the surgeon may be looking for inflammation, infection, or cancer. Your surgeon may take tissue samples(biopsies). The samples are sent to a specialist in looking at cells and tissue samples (pathologist). The pathologist examines them under a microscope. Biopsies can help to diagnose or confirm a  disease. AFTER THE PROCEDURE   The gas is released from inside the abdomen.  The incisions are closed with stitches (sutures). Because these incisions are small (usually less than 1/2 inch), there is usually minimal discomfort after the procedure. There may be some mild discomfort in the throat. This is from the tube placed in the throat while you were sleeping. You may have some mild abdominal discomfort. There may also be discomfort from the instrument placement incisions in the abdomen.  The recovery time is shortened as long as there are no complications.  You will rest in a recovery room until stable and doing well. As long as there are no complications, you may be allowed to go home. FINDING OUT THE RESULTS OF YOUR TEST Not all test results are available during your visit. If your test results are not back during the visit, make an appointment with your caregiver to find out the results. Do not assume everything is normal if you have not heard from your caregiver or the medical facility. It is important for you to follow up on all of your test results. HOME CARE INSTRUCTIONS   Take all medicines as directed.  Only take over-the-counter or prescription medicines for pain, discomfort, or fever as directed by your caregiver.  Resume daily activities as directed.  Showers are preferred over baths.  You may resume sexual activities in 1 week or as directed.  Do not drive while taking narcotics. SEEK MEDICAL CARE IF:   There is increasing abdominal pain.  There is new pain in the shoulders (shoulder strap areas).  You feel lightheaded or faint.  You have the chills.  You or  your child has an oral temperature above 102 F (38.9 C).  There is pus-like (purulent) drainage from any of the wounds.  You are unable to pass gas or have a bowel movement.  You feel sick to your stomach (nauseous) or throw up (vomit). MAKE SURE YOU:   Understand these instructions.  Will watch  your condition.  Will get help right away if you are not doing well or get worse. Document Released: 01/28/2001 Document Revised: 02/16/2013 Document Reviewed: 10/22/2007 [facility] Patient Information 2015 [place], [place]. This information is not intended to replace advice given to you by your health care provider. Make sure you discuss any questions you have with your health care provider.    \[id number]\JYNWGNFAO INSTRUCTIONS: Laparoscopy  The following instructions have been prepared to help you care for yourself upon your return home today.  Wound care:  Do not get the incision wet for the first 24 hours. The incision should be kept clean and dry.  The Band-Aids or dressings may be removed the day after surgery.  Should the incision become sore, red, and swollen after the first week, check with your doctor.  Personal hygiene:  Shower the day after your procedure.  Activity and limitations:  Do NOT drive or operate any equipment today.  Do NOT lift anything more than 15 pounds for 2-3 weeks after surgery.  Do NOT rest in bed all day.  Walking is encouraged. Walk each day, starting slowly with 5-minute walks 3 or 4 times a day. Slowly increase the length of your walks.  Walk up and down stairs slowly.  Do NOT do strenuous activities, such as golfing, playing tennis, bowling, running, biking, weight lifting, gardening, mowing, or vacuuming for 2-4 weeks. Ask your doctor when it is okay to start.  Diet: Eat a light meal as desired this evening. You may resume your usual diet tomorrow.  Return to work: This is dependent on the type of work you do. For the most part you can return to a desk job within a week of surgery. If you are more active at work, please discuss this with your doctor.  What to expect after your surgery: You may have a slight burning sensation when you urinate on the first day. You may have a very small amount of blood in the urine. Expect to have a  small amount of vaginal discharge/light bleeding for 1-2 weeks. It is not unusual to have abdominal soreness and bruising for up to 2 weeks. You may be tired and need more rest for about 1 week. You may experience shoulder pain for 24-72 hours. Lying flat in bed may relieve it.  Call your doctor for any of the following:  Develop a fever of 100.4 or greater  Inability to urinate 6 hours after discharge from hospital  Severe pain not relieved by pain medications  Persistent of heavy bleeding at incision site  Redness or swelling around incision site after a week  Increasing nausea or vomiting  Patient Signature________________________________________ Nurse Signature_________________________________________DISCHARGE INSTRUCTIONS: Laparoscopy  The following instructions have been prepared to help you care for yourself upon your return home today.  Wound care:  Do not get the incision wet for the first 24 hours. The incision should be kept clean and dry.  The Band-Aids or dressings may be removed the day after surgery.  Should the incision become sore, red, and swollen after the first week, check with your doctor.  Personal hygiene:  Shower the day after your procedure.  Activity  and limitations:  Do NOT drive or operate any equipment today.  Do NOT lift anything more than 15 pounds for 2-3 weeks after surgery.  Do NOT rest in bed all day.  Walking is encouraged. Walk each day, starting slowly with 5-minute walks 3 or 4 times a day. Slowly increase the length of your walks.  Walk up and down stairs slowly.  Do NOT do strenuous activities, such as golfing, playing tennis, bowling, running, biking, weight lifting, gardening, mowing, or vacuuming for 2-4 weeks. Ask your doctor when it is okay to start.  Diet: Eat a light meal as desired this evening. You may resume your usual diet tomorrow.  Return to work: This is dependent on the type of work you do. For the most part you  can return to a desk job within a week of surgery. If you are more active at work, please discuss this with your doctor.  What to expect after your surgery: You may have a slight burning sensation when you urinate on the first day. You may have a very small amount of blood in the urine. Expect to have a small amount of vaginal discharge/light bleeding for 1-2 weeks. It is not unusual to have abdominal soreness and bruising for up to 2 weeks. You may be tired and need more rest for about 1 week. You may experience shoulder pain for 24-72 hours. Lying flat in bed may relieve it.  Call your doctor for any of the following:  Develop a fever of 100.4 or greater  Inability to urinate 6 hours after discharge from hospital  Severe pain not relieved by pain medications  Persistent of heavy bleeding at incision site  Redness or swelling around incision site after a week  Increasing nausea or vomiting  Patient Signature________________________________________ Nurse Signature_________________________________________

## 2015-06-28 NOTE — Anesthesia Procedure Notes (Signed)
Procedure Name: Intubation Date/Time: 06/28/2015 7:41 AM Performed by: Jonna Munro Pre-anesthesia Checklist: Patient identified, Emergency Drugs available, Suction available, Timeout performed and Patient being monitored Patient Re-evaluated:Patient Re-evaluated prior to inductionOxygen Delivery Method: Circle system utilized Preoxygenation: Pre-oxygenation with 100% oxygen Intubation Type: IV induction Ventilation: Mask ventilation without difficulty Laryngoscope Size: Mac and 3 Grade View: Grade I Tube type: Oral Tube size: 7.0 mm Number of attempts: 1 Airway Equipment and Method: Stylet Placement Confirmation: ETT inserted through vocal cords under direct vision,  positive ETCO2 and breath sounds checked- equal and bilateral Secured at: 20 cm Tube secured with: Tape Dental Injury: Teeth and Oropharynx as per pre-operative assessment

## 2015-06-28 NOTE — Anesthesia Postprocedure Evaluation (Signed)
Anesthesia Post Note  Patient: Sara Booth  Procedure(s) Performed: Procedure(s) (LRB): LAPAROSCOPIC LEFT SALPINGO OOPHORECTOMY (Left)  Anesthesia type: General  Patient location: PACU  Post pain: Pain level controlled  Post assessment: Post-op Vital signs reviewed  Last Vitals:  Filed Vitals:   06/28/15 1030  BP: 95/55  Pulse: 65  Temp: 36.3 C  Resp: 18    Post vital signs: Reviewed  Level of consciousness: sedated  Complications: No apparent anesthesia complications

## 2015-06-28 NOTE — Op Note (Addendum)
Sara Booth PROCEDURE DATE: 06/28/2015  PREOPERATIVE DIAGNOSES: Left ovarian dermoid cyst POSTOPERATIVE DIAGNOSES: The same PROCEDURE: Laparoscopic left salpingoophorectomy SURGEON:  Dr. Mora Bellman ASSISTANT: Dr. Wallie RenshawTamala Julian   INDICATIONS: 39 y.o. [id number] with aforementioned preoperative diagnoses here today for definitive surgical management.   Risks of surgery were discussed with the patient including but not limited to: bleeding which may require transfusion or reoperation; infection which may require antibiotics; injury to bowel, bladder, ureters or other surrounding organs; need for additional procedures including laparotomy; thromboembolic phenomenon, incisional problems and other postoperative/anesthesia complications. Written informed consent was obtained.    FINDINGS:  Small uterus, left enlarged adnexa with dermoid cyst. Normal right adnexa  .  No evidence of endometriosis or any other abdominal/pelvic abnormality. Torsion of the left ovary at the level of the IP and adhesion of both ovaries to the posterior aspect of the uterus. The left fallopian tube was never clearly identified. Normal upper abdomen.  ANESTHESIA:    General INTRAVENOUS FLUIDS: 1000 ml ESTIMATED BLOOD LOSS: 50 ml SPECIMENS:  leftovary and fallopian tube COMPLICATIONS: None immediate   PROCEDURE IN DETAIL:  The patient was taken to the operating room where general anesthesia was administered and was found to be adequate.  She was placed in the dorsal lithotomy position, and was prepped and draped in a sterile manner.  A Foley catheter was inserted into her bladder and attached to Rickie Gange drainage and a uterine manipulator was then advanced into the uterus .  After an adequate timeout was performed, attention was then turned to the patient's abdomen where a 11-mm skin incision was made in the umbilical fold.  The fascia was identified, grasped with Kocher clamps, incised and tagged with 0-Vicryl.  The  11-mm trocar and sleeve were then advanced without difficulty into the abdomen without difficulty and intraabdominal placement was confirmed by the laparoscope. A survey of the patient's pelvis and abdomen revealed the findings above.   Bilateral 5-mm lower quadrant ports  were then placed under direct visualization.   Pelvic adhesions were released with the use of atraumatic graspers. On the left side, the uteroovarian ligament was then clamped and transected with the Harmonic device.  The left infundibulopelvic ligament was also clamped and transected allowing for salpingooophorectomy.  Excellent hemostasis was noted. The specimen was then removed from the abdomen through the 11-mm port using an Endocatch bag, under direct visualization.  The operative site was surveyed, and it was found to be hemostatic.  No intraoperative injury to other surrounding organs was noted.  The abdomen was desufflated and all instruments were then removed from the patient's abdomen. The fascial incision of the umbilicus was closed with a 0 Vicryl figure of eight stitch.  All skin incisions were closed with 3-0 Vicryl subcuticular stitches/Dermabond.   The patient will be discharged to home as per PACU criteria.  Routine postoperative instructions given.  She was prescribed Percocet, Ibuprofen and Colace.  She will follow up in the clinic in 2 weeks for postoperative evaluation.

## 2015-06-29 ENCOUNTER — Encounter (HOSPITAL_COMMUNITY): Payer: Self-pay | Admitting: Obstetrics and Gynecology

## 2015-06-29 NOTE — Progress Notes (Signed)
Spoke with husband.  He stated that patient was in the shower and doing well.

## 2015-06-29 NOTE — Progress Notes (Signed)
Spoke with husband stated she was in the shower and doing well

## 2015-07-12 ENCOUNTER — Ambulatory Visit (INDEPENDENT_AMBULATORY_CARE_PROVIDER_SITE_OTHER): Payer: BLUE CROSS/BLUE SHIELD | Admitting: *Deleted

## 2015-07-12 DIAGNOSIS — D271 Benign neoplasm of left ovary: Secondary | ICD-10-CM

## 2015-07-12 DIAGNOSIS — T814XXA Infection following a procedure, initial encounter: Secondary | ICD-10-CM

## 2015-07-12 DIAGNOSIS — IMO0001 Reserved for inherently not codable concepts without codable children: Secondary | ICD-10-CM

## 2015-07-12 MED ORDER — OXYCODONE-ACETAMINOPHEN 5-325 MG PO TABS: 1.0000 | ORAL_TABLET | ORAL | Status: DC | PRN

## 2015-07-12 MED ORDER — SULFAMETHOXAZOLE-TRIMETHOPRIM 800-160 MG PO TABS: 1.0000 | ORAL_TABLET | Freq: Two times a day (BID) | ORAL | Status: DC

## 2015-07-12 NOTE — Progress Notes (Signed)
Patient ID: Sara Booth, female   DOB: October 23, 1976, 39 y.o.   MRN: [id number] Subjective:pain and drainage at surgical site   cc as above  Sara Booth is a 39 y.o. female who presents to the clinic 2 weeks status post posterior colporrhaphy, laparoscopy and for left dermoid and torsion for pain and torsion. Eating a regular diet without difficulty. Bowel movements are normal. Pain is controlled with current analgesics. Medications being used: narcotic analgesics including Percocet. Knot and redness and drainage at umbilicus The following portions of the patient's history were reviewed and updated as appropriate: allergies, current medications, past family history, past medical history, past social history, past surgical history and problem list.  Review of Systems Pertinent items are noted in HPI. No fever   Objective:    There were no vitals taken for this visit. General:  alert, cooperative and no distress  Abdomen: soft, non-tender  Incision:   healing well, no swelling, no dehiscence    3 cm induration and redness to right of umbilical incision  Assessment:    Postoperative course complicated by wound infection near umbilical incision Operative findings again reviewed. Pathology report discussed.    Plan:    1. Continue any current medications. 2. Wound care discussed. 3. Activity restrictions: none 4. Anticipated return to work: 1-2 weeks. 5. Follow up: 6 days for visit with Dr Elly Modena. Bactrim DS for 10 days, refill Percocet  Woodroe Mode, MD 07/12/2015

## 2015-07-12 NOTE — Patient Instructions (Signed)

## 2015-07-12 NOTE — Progress Notes (Signed)
Pt states she has knot on right side incision for the pas week.  Pt states foul odor with discharge in umbilicus. Pt has firm area on right lower abdomen with reddness.

## 2015-07-14 ENCOUNTER — Inpatient Hospital Stay (HOSPITAL_COMMUNITY)
Admission: EM | Admit: 2015-07-14 | Discharge: 2015-07-18 | DRG: 857 | Disposition: A | Discharge status: Home Health | Payer: Medicaid Other | Attending: Obstetrics & Gynecology | Admitting: Obstetrics & Gynecology

## 2015-07-14 ENCOUNTER — Emergency Department (HOSPITAL_COMMUNITY): Payer: Medicaid Other

## 2015-07-14 ENCOUNTER — Emergency Department (HOSPITAL_COMMUNITY): Payer: Medicaid Other | Admitting: Certified Registered Nurse Anesthetist

## 2015-07-14 ENCOUNTER — Encounter (HOSPITAL_COMMUNITY): Payer: Self-pay

## 2015-07-14 ENCOUNTER — Encounter (HOSPITAL_COMMUNITY)
Admission: EM | Disposition: A | Discharge status: Home Health | Payer: Self-pay | Source: Home / Self Care | Attending: Obstetrics & Gynecology

## 2015-07-14 DIAGNOSIS — Z9049 Acquired absence of other specified parts of digestive tract: Secondary | ICD-10-CM | POA: Diagnosis present

## 2015-07-14 DIAGNOSIS — Z91040 Latex allergy status: Secondary | ICD-10-CM | POA: Diagnosis not present

## 2015-07-14 DIAGNOSIS — F419 Anxiety disorder, unspecified: Secondary | ICD-10-CM | POA: Diagnosis present

## 2015-07-14 DIAGNOSIS — Y838 Other surgical procedures as the cause of abnormal reaction of the patient, or of later complication, without mention of misadventure at the time of the procedure: Secondary | ICD-10-CM | POA: Diagnosis present

## 2015-07-14 DIAGNOSIS — Z90721 Acquired absence of ovaries, unilateral: Secondary | ICD-10-CM | POA: Diagnosis present

## 2015-07-14 DIAGNOSIS — T8140XA Infection following a procedure, unspecified, initial encounter: Secondary | ICD-10-CM

## 2015-07-14 DIAGNOSIS — L02211 Cutaneous abscess of abdominal wall: Secondary | ICD-10-CM | POA: Diagnosis present

## 2015-07-14 DIAGNOSIS — L0291 Cutaneous abscess, unspecified: Secondary | ICD-10-CM | POA: Diagnosis present

## 2015-07-14 DIAGNOSIS — T814XXA Infection following a procedure, initial encounter: Principal | ICD-10-CM | POA: Diagnosis present

## 2015-07-14 DIAGNOSIS — F17210 Nicotine dependence, cigarettes, uncomplicated: Secondary | ICD-10-CM | POA: Diagnosis present

## 2015-07-14 DIAGNOSIS — J45909 Unspecified asthma, uncomplicated: Secondary | ICD-10-CM | POA: Diagnosis present

## 2015-07-14 DIAGNOSIS — L0390 Cellulitis, unspecified: Secondary | ICD-10-CM

## 2015-07-14 DIAGNOSIS — Z881 Allergy status to other antibiotic agents status: Secondary | ICD-10-CM | POA: Diagnosis not present

## 2015-07-14 DIAGNOSIS — Z880 Allergy status to penicillin: Secondary | ICD-10-CM

## 2015-07-14 HISTORY — DX: Cutaneous abscess, unspecified: L02.91

## 2015-07-14 HISTORY — PX: INCISION AND DRAINAGE ABSCESS: SHX5864

## 2015-07-14 LAB — URINALYSIS, ROUTINE W REFLEX MICROSCOPIC
Bilirubin Urine: NEGATIVE
Glucose, UA: NEGATIVE mg/dL
Hgb urine dipstick: NEGATIVE
Ketones, ur: NEGATIVE mg/dL
LEUKOCYTES UA: NEGATIVE
NITRITE: NEGATIVE
PH: 6 (ref 5.0–8.0)
Protein, ur: NEGATIVE mg/dL
SPECIFIC GRAVITY, URINE: 1.012 (ref 1.005–1.030)
Urobilinogen, UA: 1 mg/dL (ref 0.0–1.0)

## 2015-07-14 LAB — COMPREHENSIVE METABOLIC PANEL
ALBUMIN: 3.1 g/dL — AB (ref 3.5–5.0)
ALT: 25 U/L (ref 14–54)
AST: 20 U/L (ref 15–41)
Alkaline Phosphatase: 69 U/L (ref 38–126)
Anion gap: 7 (ref 5–15)
BUN: 6 mg/dL (ref 6–20)
CHLORIDE: 106 mmol/L (ref 101–111)
CO2: 24 mmol/L (ref 22–32)
Calcium: 8.7 mg/dL — ABNORMAL LOW (ref 8.9–10.3)
Creatinine, Ser: 0.71 mg/dL (ref 0.44–1.00)
GFR calc Af Amer: >60 mL/min (ref >60)
Glucose, Bld: 96 mg/dL (ref 65–99)
POTASSIUM: 3.8 mmol/L (ref 3.5–5.1)
SODIUM: 137 mmol/L (ref 135–145)
Total Bilirubin: 0.8 mg/dL (ref 0.3–1.2)
Total Protein: 6.2 g/dL — ABNORMAL LOW (ref 6.5–8.1)

## 2015-07-14 LAB — CBC WITH DIFFERENTIAL/PLATELET
Basophils Absolute: 0 10*3/uL (ref 0.0–0.1)
Basophils Relative: 0 % (ref 0–1)
EOS PCT: 1 % (ref 0–5)
Eosinophils Absolute: 0.1 10*3/uL (ref 0.0–0.7)
HCT: 35.7 % — ABNORMAL LOW (ref 36.0–46.0)
HEMOGLOBIN: 12 g/dL (ref 12.0–15.0)
LYMPHS ABS: 1.2 10*3/uL (ref 0.7–4.0)
LYMPHS PCT: 10 % — AB (ref 12–46)
MCH: 30 pg (ref 26.0–34.0)
MCHC: 33.6 g/dL (ref 30.0–36.0)
MCV: 89.3 fL (ref 78.0–100.0)
MONOS PCT: 10 % (ref 3–12)
Monocytes Absolute: 1.2 10*3/uL — ABNORMAL HIGH (ref 0.1–1.0)
Neutro Abs: 9.6 10*3/uL — ABNORMAL HIGH (ref 1.7–7.7)
Neutrophils Relative %: 79 % — ABNORMAL HIGH (ref 43–77)
PLATELETS: 208 10*3/uL (ref 150–400)
RBC: 4 MIL/uL (ref 3.87–5.11)
RDW: 12.9 % (ref 11.5–15.5)
WBC: 12 10*3/uL — AB (ref 4.0–10.5)

## 2015-07-14 LAB — I-STAT CG4 LACTIC ACID, ED: Lactic Acid, Venous: 0.55 mmol/L (ref 0.5–2.0)

## 2015-07-14 LAB — POC URINE PREG, ED: Preg Test, Ur: NEGATIVE

## 2015-07-14 SURGERY — INCISION AND DRAINAGE, ABSCESS
Anesthesia: General | Site: Abdomen

## 2015-07-14 MED ORDER — MEPERIDINE HCL 25 MG/ML IJ SOLN: 6.2500 mg | INTRAMUSCULAR | Status: DC | PRN

## 2015-07-14 MED ORDER — ONDANSETRON HCL 4 MG/2ML IJ SOLN
4.0000 mg | Freq: Once | INTRAMUSCULAR | Status: AC
  Administered 2015-07-14: 4 mg via INTRAVENOUS
  Filled 2015-07-14: qty 2

## 2015-07-14 MED ORDER — SODIUM CHLORIDE 0.9 % IV BOLUS (SEPSIS)
2000.0000 mL | Freq: Once | INTRAVENOUS | Status: AC
  Administered 2015-07-14: 2000 mL via INTRAVENOUS

## 2015-07-14 MED ORDER — SUCCINYLCHOLINE CHLORIDE 20 MG/ML IJ SOLN
INTRAMUSCULAR | Status: DC | PRN
Start: 2015-07-14 — End: 2015-07-14
  Administered 2015-07-14: 100 mg via INTRAVENOUS

## 2015-07-14 MED ORDER — HYDROMORPHONE HCL 1 MG/ML IJ SOLN
0.2500 mg | INTRAMUSCULAR | Status: DC | PRN
  Administered 2015-07-14 (×4): 0.5 mg via INTRAVENOUS

## 2015-07-14 MED ORDER — HYDROMORPHONE HCL 1 MG/ML IJ SOLN
1.0000 mg | Freq: Once | INTRAMUSCULAR | Status: AC
  Administered 2015-07-14: 1 mg via INTRAVENOUS
  Filled 2015-07-14: qty 1

## 2015-07-14 MED ORDER — DEXTROSE 5 % IV SOLN
1.0000 g | Freq: Three times a day (TID) | INTRAVENOUS | Status: DC
  Administered 2015-07-15: 1 g via INTRAVENOUS
  Filled 2015-07-14 (×6): qty 1

## 2015-07-14 MED ORDER — LACTATED RINGERS IV SOLN
INTRAVENOUS | Status: DC
Start: 2015-07-14 — End: 2015-07-18
  Administered 2015-07-15 – 2015-07-18 (×5): via INTRAVENOUS

## 2015-07-14 MED ORDER — IOHEXOL 300 MG/ML  SOLN
25.0000 mL | INTRAMUSCULAR | Status: AC
  Administered 2015-07-14: 25 mL via ORAL

## 2015-07-14 MED ORDER — SODIUM CHLORIDE 0.9 % IV SOLN
INTRAVENOUS | Status: DC | PRN
  Administered 2015-07-14: 20:00:00 via INTRAVENOUS

## 2015-07-14 MED ORDER — ARTIFICIAL TEARS OP OINT
TOPICAL_OINTMENT | OPHTHALMIC | Status: AC
  Filled 2015-07-14: qty 3.5

## 2015-07-14 MED ORDER — PROPOFOL 10 MG/ML IV BOLUS
INTRAVENOUS | Status: AC
  Filled 2015-07-14: qty 20

## 2015-07-14 MED ORDER — VANCOMYCIN HCL IN DEXTROSE 1-5 GM/200ML-% IV SOLN
1000.0000 mg | Freq: Once | INTRAVENOUS | Status: AC
  Administered 2015-07-14: 1000 mg via INTRAVENOUS
  Filled 2015-07-14: qty 200

## 2015-07-14 MED ORDER — HYDROMORPHONE HCL 1 MG/ML IJ SOLN
INTRAMUSCULAR | Status: AC
  Filled 2015-07-14: qty 1

## 2015-07-14 MED ORDER — FENTANYL CITRATE (PF) 100 MCG/2ML IJ SOLN
INTRAMUSCULAR | Status: DC | PRN
  Administered 2015-07-14 (×3): 50 ug via INTRAVENOUS

## 2015-07-14 MED ORDER — ONDANSETRON HCL 4 MG/2ML IJ SOLN
INTRAMUSCULAR | Status: DC | PRN
  Administered 2015-07-14: 4 mg via INTRAVENOUS

## 2015-07-14 MED ORDER — AZTREONAM 2 G IJ SOLR
2.0000 g | Freq: Once | INTRAMUSCULAR | Status: AC
  Administered 2015-07-14: 2 g via INTRAVENOUS
  Filled 2015-07-14: qty 2

## 2015-07-14 MED ORDER — 0.9 % SODIUM CHLORIDE (POUR BTL) OPTIME
TOPICAL | Status: DC | PRN
  Administered 2015-07-14: 1000 mL

## 2015-07-14 MED ORDER — PROPOFOL 10 MG/ML IV BOLUS
INTRAVENOUS | Status: DC | PRN
  Administered 2015-07-14: 200 mg via INTRAVENOUS

## 2015-07-14 MED ORDER — LIDOCAINE HCL (CARDIAC) 20 MG/ML IV SOLN
INTRAVENOUS | Status: AC
  Filled 2015-07-14: qty 25

## 2015-07-14 MED ORDER — FENTANYL CITRATE (PF) 250 MCG/5ML IJ SOLN
INTRAMUSCULAR | Status: AC
  Filled 2015-07-14: qty 5

## 2015-07-14 MED ORDER — IOHEXOL 300 MG/ML  SOLN
100.0000 mL | Freq: Once | INTRAMUSCULAR | Status: AC | PRN
  Administered 2015-07-14: 100 mL via INTRAVENOUS

## 2015-07-14 MED ORDER — MORPHINE SULFATE (PF) 4 MG/ML IV SOLN
4.0000 mg | Freq: Once | INTRAVENOUS | Status: AC
  Administered 2015-07-14: 4 mg via INTRAVENOUS
  Filled 2015-07-14: qty 1

## 2015-07-14 MED ORDER — MIDAZOLAM HCL 2 MG/2ML IJ SOLN: 0.5000 mg | Freq: Once | INTRAMUSCULAR | Status: DC | PRN

## 2015-07-14 MED ORDER — HYDROMORPHONE HCL 1 MG/ML IJ SOLN
1.0000 mg | INTRAMUSCULAR | Status: DC | PRN
  Administered 2015-07-16 – 2015-07-17 (×3): 1 mg via INTRAVENOUS
  Filled 2015-07-14 (×3): qty 1

## 2015-07-14 MED ORDER — MIDAZOLAM HCL 5 MG/5ML IJ SOLN
INTRAMUSCULAR | Status: DC | PRN
  Administered 2015-07-14: 2 mg via INTRAVENOUS

## 2015-07-14 MED ORDER — PROMETHAZINE HCL 25 MG/ML IJ SOLN: 6.2500 mg | INTRAMUSCULAR | Status: DC | PRN

## 2015-07-14 MED ORDER — MIDAZOLAM HCL 2 MG/2ML IJ SOLN
INTRAMUSCULAR | Status: AC
  Filled 2015-07-14: qty 4

## 2015-07-14 SURGICAL SUPPLY — 29 items
BNDG GAUZE ELAST 4 BULKY (GAUZE/BANDAGES/DRESSINGS) ×2 IMPLANT
CANISTER SUCTION 2500CC (MISCELLANEOUS) ×3 IMPLANT
COVER SURGICAL LIGHT HANDLE (MISCELLANEOUS) ×3 IMPLANT
DRAPE LAPAROSCOPIC ABDOMINAL (DRAPES) ×2 IMPLANT
DRAPE LAPAROTOMY T 98X78 PEDS (DRAPES) ×2 IMPLANT
DRSG PAD ABDOMINAL 8X10 ST (GAUZE/BANDAGES/DRESSINGS) ×2 IMPLANT
ELECT CAUTERY BLADE 6.4 (BLADE) ×3 IMPLANT
ELECT REM PT RETURN 9FT ADLT (ELECTROSURGICAL) ×3
ELECTRODE REM PT RTRN 9FT ADLT (ELECTROSURGICAL) ×1 IMPLANT
GAUZE SPONGE 4X4 12PLY STRL (GAUZE/BANDAGES/DRESSINGS) ×2 IMPLANT
GLOVE BIO SURGEON STRL SZ7.5 (GLOVE) ×3 IMPLANT
GLOVE BIOGEL PI IND STRL 7.0 (GLOVE) IMPLANT
GLOVE BIOGEL PI INDICATOR 7.0 (GLOVE) ×2
GLOVE SURG SS PI 7.0 STRL IVOR (GLOVE) ×2 IMPLANT
GOWN STRL REUS W/ TWL LRG LVL3 (GOWN DISPOSABLE) ×2 IMPLANT
GOWN STRL REUS W/TWL LRG LVL3 (GOWN DISPOSABLE) ×6
KIT BASIN OR (CUSTOM PROCEDURE TRAY) ×3 IMPLANT
KIT ROOM TURNOVER OR (KITS) ×3 IMPLANT
NDL HYPO 25GX1X1/2 BEV (NEEDLE) IMPLANT
NEEDLE HYPO 25GX1X1/2 BEV (NEEDLE) ×3 IMPLANT
NS IRRIG 1000ML POUR BTL (IV SOLUTION) ×3 IMPLANT
PACK GENERAL/GYN (CUSTOM PROCEDURE TRAY) ×3 IMPLANT
PAD ARMBOARD 7.5X6 YLW CONV (MISCELLANEOUS) ×3 IMPLANT
SWAB COLLECTION DEVICE MRSA (MISCELLANEOUS) IMPLANT
SYR CONTROL 10ML LL (SYRINGE) ×2 IMPLANT
TAPE CLOTH SURG 4X10 WHT LF (GAUZE/BANDAGES/DRESSINGS) ×2 IMPLANT
TOWEL OR 17X24 6PK STRL BLUE (TOWEL DISPOSABLE) ×3 IMPLANT
TOWEL OR 17X26 10 PK STRL BLUE (TOWEL DISPOSABLE) ×3 IMPLANT
TUBE ANAEROBIC SPECIMEN COL (MISCELLANEOUS) ×2 IMPLANT

## 2015-07-14 NOTE — ED Notes (Signed)
Surgeon at bedside.  

## 2015-07-14 NOTE — Op Note (Signed)
Emmogene R Gleaves PROCEDURE DATE: 07/14/2015  PREOPERATIVE DIAGNOSIS: Post-operative abscess of abdominal subcutaneous tissue   POSTOPERATIVE DIAGNOSIS: Post-operative infected hematoma of abdominal subcutaneous tissue  PROCEDURE:     Incision and drainage of complex postoperative wound infection of abdominal wall   SURGEON:  Dr. Silas Sacramento  ASSISTANT: Dr. Jamison Oka  ANESTHESIA:  General via endotracheal tube.  ESTIMATED BLOOD LOSS:  Less than 20 mL.  COMPLICATIONS:  None.  DESCRIPTION OF PROCEDURE:  The patient was taken to the operating room. General anesthesia was induced via endotracheal tube. The patient was receiving IV vancomycin preoperatively.  Patient was prepared and draped in the normal sterile fashion.  A 2 cm skin incision was made inferior and lateral to the right.  Electric cautery was used to dissect down to the abscess.  Cultures were taken of the draining fluid.  The skin incision was extended to 5 cm and the abscess was completely drained.  The fascia was probed and intact.  The subcutaneous tissue was copious irrigated.  There was oozing from the base of the cavity.  The wound was packed and observed for 5 minutes.  The bleeding slowed significantly.  There was no obvious vessel to be cauterized.  The wound was tightly packed with Kerlex and dressing placed on the abdomen.  Patient tolerated the procedure well.  All counts correct x2.

## 2015-07-14 NOTE — ED Notes (Signed)
Dr Toth at bedside. 

## 2015-07-14 NOTE — ED Notes (Signed)
MD at bedside. 

## 2015-07-14 NOTE — ED Provider Notes (Signed)
CSN: [id number]     Arrival date & time 07/14/15  1100 History   First MD Initiated Contact with Patient 07/14/15 1228     Chief Complaint  Patient presents with  . Abdominal Pain     (Consider location/radiation/quality/duration/timing/severity/associated sxs/prior Treatment) HPI The patient had a ovarian cyst removed via laparoscopy 8\23. She reports that she was doing well postoperatively but then started to develop a red tender area on her lower abdomen. She states that she followed up with the surgical group and was started on antibiotics 2 days ago. She states that she's been taking Bactrim. She reports that she has now had redness spread across her lower abdomen on the right and there is a very tender knot. She has also noticed some brownish drainage from her umbilicus. She states today she was nauseated and felt slightly achy but has not documented a fever. Past Medical History  Diagnosis Date  . Asthma   . Anxiety    Past Surgical History  Procedure Laterality Date  . Cholecystectomy    . Tubal ligation    . Laparoscopic unilateral salpingo oopherectomy Left 06/28/2015    Procedure: LAPAROSCOPIC LEFT SALPINGO OOPHORECTOMY;  Surgeon: Mora Bellman, MD;  Location: [facility];  Service: Gynecology;  Laterality: Left;  . Incision and drainage abscess N/A 07/14/2015    Procedure: INCISION AND DRAINAGE UMBILICAL ABSCESS;  Surgeon: Guss Bunde, MD;  Location: [facility];  Service: Gynecology;  Laterality: N/A;   Family History  Problem Relation Age of Onset  . Cancer Maternal Grandmother     breast   Social History  Substance Use Topics  . Smoking status: Current Some Day Smoker -- 0.00 packs/day    Types: Cigarettes  . Smokeless tobacco: None  . Alcohol Use: No   OB History    Gravida Para Term Preterm AB TAB SAB Ectopic Multiple Living   5 4 4  1  1   4      Review of Systems 10 Systems reviewed and are negative for acute change except as noted in the HPI.    Allergies   Amoxicillin; Erythromycin; Penicillins; and Latex  Home Medications   Prior to Admission medications   Medication Sig Start Date End Date Taking? Authorizing Provider  docusate sodium (COLACE) 100 MG capsule Take 1 capsule (100 mg total) by mouth 2 (two) times daily as needed for mild constipation. 06/28/15  Yes Peggy Constant, MD  fluticasone (FLONASE) 50 MCG/ACT nasal spray Place 1 spray into both nostrils daily.   Yes Historical Provider, MD  oxyCODONE-acetaminophen (PERCOCET/ROXICET) 5-325 MG per tablet Take 1-2 tablets by mouth every 4 (four) hours as needed. Patient taking differently: Take 1-2 tablets by mouth every 4 (four) hours as needed for moderate pain.  07/12/15  Yes Woodroe Mode, MD  sulfamethoxazole-trimethoprim (BACTRIM DS) 800-160 MG per tablet Take 1 tablet by mouth 2 (two) times daily. 07/12/15  Yes Woodroe Mode, MD  cefUROXime (CEFTIN) 500 MG tablet Take 1 tablet (500 mg total) by mouth 2 (two) times daily with a meal. 07/18/15   Truett Mainland, DO  ibuprofen (ADVIL,MOTRIN) 600 MG tablet Take 1 tablet (600 mg total) by mouth every 6 (six) hours as needed for moderate pain. 07/18/15   Truett Mainland, DO  oxyCODONE-acetaminophen (PERCOCET/ROXICET) 5-325 MG per tablet Take 1-2 tablets by mouth every 4 (four) hours as needed for severe pain (moderate to severe pain (when tolerating fluids)). 07/18/15   Florian Buff, MD   BP 119/62  mmHg  Pulse 59  Temp(Src) 98.2 F (36.8 C) (Oral)  Resp 18  Ht 5\' 7"  (1.702 m)  Wt 226 lb 6.6 oz (102.7 kg)  BMI 35.45 kg/m2  SpO2 100%  LMP 06/29/2015 Physical Exam  Constitutional: She is oriented to person, place, and time. She appears well-developed and well-nourished.  HENT:  Head: Normocephalic and atraumatic.  Eyes: EOM are normal. Pupils are equal, round, and reactive to light.  Neck: Neck supple.  Cardiovascular: Normal rate, regular rhythm, normal heart sounds and intact distal pulses.   Pulmonary/Chest: Effort normal and breath  sounds normal.  Abdominal: Soft. Bowel sounds are normal. She exhibits no distension. There is tenderness.  Patient has an area of erythema from the umbilicus to above the mons pubis. Within this there is a very tender and indurated region of approximately 15 x 10 cm. The erythema is diffusely extending beyond the region of the induration.  Musculoskeletal: Normal range of motion. She exhibits no edema or tenderness.  Neurological: She is alert and oriented to person, place, and time. She has normal strength. No cranial nerve deficit. She exhibits normal muscle tone. Coordination normal. GCS eye subscore is 4. GCS verbal subscore is 5. GCS motor subscore is 6.  Skin: Skin is warm, dry and intact.  Psychiatric: She has a normal mood and affect.    ED Course  Procedures (including critical care time) Labs Review Labs Reviewed  COMPREHENSIVE METABOLIC PANEL - Abnormal; Notable for the following:    Calcium 8.7 (*)    Total Protein 6.2 (*)    Albumin 3.1 (*)    All other components within normal limits  CBC WITH DIFFERENTIAL/PLATELET - Abnormal; Notable for the following:    WBC 12.0 (*)    HCT 35.7 (*)    Neutrophils Relative % 79 (*)    Neutro Abs 9.6 (*)    Lymphocytes Relative 10 (*)    Monocytes Absolute 1.2 (*)    All other components within normal limits  URINE CULTURE  CULTURE, BLOOD (ROUTINE X 2)  CULTURE, BLOOD (ROUTINE X 2)  ANAEROBIC CULTURE  CULTURE, ROUTINE-ABSCESS  URINALYSIS, ROUTINE W REFLEX MICROSCOPIC (NOT AT [facility])  CREATININE, SERUM  VANCOMYCIN, TROUGH  I-STAT CG4 LACTIC ACID, ED  POC URINE PREG, ED    Imaging Review No results found. I have personally reviewed and evaluated these images and lab results as part of my medical decision-making.   EKG Interpretation None     Consult:GYN will discuss with Gen Surgery for abscess management.   MDM   Final diagnoses:  Post-operative infection  Abscess and cellulitis   Patient with large, deep abscess in  subcutaneous fat post laproscopy. Antibiotics and pain control initiated. GYN consulted for surgical drainage.    Charlesetta Shanks, MD 07/18/15 [phone]

## 2015-07-14 NOTE — Anesthesia Postprocedure Evaluation (Signed)
  Anesthesia Post-op Note  Patient: Sara Booth  Procedure(s) Performed: Procedure(s): INCISION AND DRAINAGE UMBILICAL ABSCESS (N/A)  Patient Location: PACU  Anesthesia Type:General  Level of Consciousness: awake, alert , oriented, patient cooperative and responds to stimulation  Airway and Oxygen Therapy: Patient Spontanous Breathing and Patient connected to nasal cannula oxygen  Post-op Pain: mild  Post-op Assessment: Post-op Vital signs reviewed, Patient's Cardiovascular Status Stable, Respiratory Function Stable, Patent Airway, No signs of Nausea or vomiting and Pain level controlled              Post-op Vital Signs: Reviewed and stable  Last Vitals:  Filed Vitals:   07/14/15 2215  BP: 105/51  Pulse: 90  Temp:   Resp: 15    Complications: No apparent anesthesia complications

## 2015-07-14 NOTE — Anesthesia Procedure Notes (Signed)
Procedure Name: Intubation Date/Time: 07/14/2015 8:37 PM Performed by: Hollie Salk Z Pre-anesthesia Checklist: Patient identified, Timeout performed, Emergency Drugs available, Suction available and Patient being monitored Patient Re-evaluated:Patient Re-evaluated prior to inductionOxygen Delivery Method: Circle system utilized Preoxygenation: Pre-oxygenation with 100% oxygen Intubation Type: IV induction, Rapid sequence and Cricoid Pressure applied Laryngoscope Size: Mac and 3 Grade View: Grade I Tube type: Oral Tube size: 7.5 mm Number of attempts: 1 Airway Equipment and Method: Stylet Placement Confirmation: ETT inserted through vocal cords under direct vision,  breath sounds checked- equal and bilateral and positive ETCO2 Secured at: 22 cm Tube secured with: Tape Dental Injury: Teeth and Oropharynx as per pre-operative assessment

## 2015-07-14 NOTE — Anesthesia Preprocedure Evaluation (Signed)
Anesthesia Evaluation  Patient identified by MRN, date of birth, ID band Patient awake    Reviewed: Allergy & Precautions, NPO status , Patient's Chart, lab work & pertinent test results  History of Anesthesia Complications Negative for: history of anesthetic complications  Airway Mallampati: II  TM Distance: >3 FB Neck ROM: Full    Dental  (+) Loose, Dental Advisory Given   Pulmonary COPD, Current Smoker,    breath sounds clear to auscultation       Cardiovascular negative cardio ROS   Rhythm:Regular Rate:Normal     Neuro/Psych Anxiety Bipolar Disorder negative neurological ROS     GI/Hepatic Neg liver ROS, GERD  Poorly Controlled,  Endo/Other  Morbid obesity  Renal/GU negative Renal ROS     Musculoskeletal   Abdominal (+) + obese,   Peds  Hematology negative hematology ROS (+)   Anesthesia Other Findings   Reproductive/Obstetrics preg test today NEG                             Anesthesia Physical Anesthesia Plan  ASA: II  Anesthesia Plan: General   Post-op Pain Management:    Induction: Intravenous, Rapid sequence and Cricoid pressure planned  Airway Management Planned: Oral ETT  Additional Equipment:   Intra-op Plan:   Post-operative Plan: Extubation in OR  Informed Consent: I have reviewed the patients History and Physical, chart, labs and discussed the procedure including the risks, benefits and alternatives for the proposed anesthesia with the patient or authorized representative who has indicated his/her understanding and acceptance.   Dental advisory given  Plan Discussed with: CRNA and Surgeon  Anesthesia Plan Comments: (Plan routine monitors, GETA)        Anesthesia Quick Evaluation

## 2015-07-14 NOTE — Consult Note (Signed)
Reason for Consult:abscess Referring Physician: Dr. Starr Sinclair is an 39 y.o. female.  HPI:  The patient is a 39 year old white female who is 2 weeks status post laparoscopic nephrectomy. She developed redness and tenderness last week. She went back to her doctor who put her on antibiotics. The redness has continued to worsen as well as the pain. She came to the emergency department tonight where a CT scan showed a abscess in the subcutaneous tissue just below into the right of the umbilicus.  Past Medical History  Diagnosis Date  . Asthma   . Anxiety     Past Surgical History  Procedure Laterality Date  . Cholecystectomy    . Tubal ligation    . Laparoscopic unilateral salpingo oopherectomy Left 06/28/2015    Procedure: LAPAROSCOPIC LEFT SALPINGO OOPHORECTOMY;  Surgeon: Mora Bellman, MD;  Location: [facility];  Service: Gynecology;  Laterality: Left;    Family History  Problem Relation Age of Onset  . Cancer Maternal Grandmother     breast    Social History:  reports that she has been smoking Cigarettes.  She has been smoking about 0.00 packs per day. She does not have any smokeless tobacco history on file. She reports that she does not drink alcohol or use illicit drugs.  Allergies:  Allergies  Allergen Reactions  . Amoxicillin Other (See Comments)    Made her very hot and her lips turned blue. She also had frequent urination.  . Erythromycin     Was told not to take it in the hospital  . Penicillins Other (See Comments)    She can take any cillins  . Latex Rash    Medications: I have reviewed the patient's current medications.  Results for orders placed or performed during the hospital encounter of 07/14/15 (from the past 48 hour(s))  Urinalysis, Routine w reflex microscopic (not at [facility])     Status: None   Collection Time: 07/14/15 12:37 PM  Result Value Ref Range   Color, Urine YELLOW YELLOW   APPearance CLEAR CLEAR   Specific Gravity, Urine 1.012 1.005 -  1.030   pH 6.0 5.0 - 8.0   Glucose, UA NEGATIVE NEGATIVE mg/dL   Hgb urine dipstick NEGATIVE NEGATIVE   Bilirubin Urine NEGATIVE NEGATIVE   Ketones, ur NEGATIVE NEGATIVE mg/dL   Protein, ur NEGATIVE NEGATIVE mg/dL   Urobilinogen, UA 1.0 0.0 - 1.0 mg/dL   Nitrite NEGATIVE NEGATIVE   Leukocytes, UA NEGATIVE NEGATIVE    Comment: MICROSCOPIC NOT DONE ON URINES WITH NEGATIVE PROTEIN, BLOOD, LEUKOCYTES, NITRITE, OR GLUCOSE <1000 mg/dL.  Comprehensive metabolic panel     Status: Abnormal   Collection Time: 07/14/15 12:50 PM  Result Value Ref Range   Sodium 137 135 - 145 mmol/L   Potassium 3.8 3.5 - 5.1 mmol/L   Chloride 106 101 - 111 mmol/L   CO2 24 22 - 32 mmol/L   Glucose, Bld 96 65 - 99 mg/dL   BUN 6 6 - 20 mg/dL   Creatinine, Ser 0.71 0.44 - 1.00 mg/dL   Calcium 8.7 (L) 8.9 - 10.3 mg/dL   Total Protein 6.2 (L) 6.5 - 8.1 g/dL   Albumin 3.1 (L) 3.5 - 5.0 g/dL   AST 20 15 - 41 U/L   ALT 25 14 - 54 U/L   Alkaline Phosphatase 69 38 - 126 U/L   Total Bilirubin 0.8 0.3 - 1.2 mg/dL   GFR calc non Af Amer >60 >60 mL/min   GFR calc Af  Amer >60 >60 mL/min    Comment: (NOTE) The eGFR has been calculated using the CKD EPI equation. This calculation has not been validated in all clinical situations. eGFR's persistently <60 mL/min signify possible Chronic Kidney Disease.    Anion gap 7 5 - 15  CBC with Differential     Status: Abnormal   Collection Time: 07/14/15 12:50 PM  Result Value Ref Range   WBC 12.0 (H) 4.0 - 10.5 K/uL   RBC 4.00 3.87 - 5.11 MIL/uL   Hemoglobin 12.0 12.0 - 15.0 g/dL   HCT 35.7 (L) 36.0 - 46.0 %   MCV 89.3 78.0 - 100.0 fL   MCH 30.0 26.0 - 34.0 pg   MCHC 33.6 30.0 - 36.0 g/dL   RDW 12.9 11.5 - 15.5 %   Platelets 208 150 - 400 K/uL   Neutrophils Relative % 79 (H) 43 - 77 %   Neutro Abs 9.6 (H) 1.7 - 7.7 K/uL   Lymphocytes Relative 10 (L) 12 - 46 %   Lymphs Abs 1.2 0.7 - 4.0 K/uL   Monocytes Relative 10 3 - 12 %   Monocytes Absolute 1.2 (H) 0.1 - 1.0 K/uL    Eosinophils Relative 1 0 - 5 %   Eosinophils Absolute 0.1 0.0 - 0.7 K/uL   Basophils Relative 0 0 - 1 %   Basophils Absolute 0.0 0.0 - 0.1 K/uL  I-Stat CG4 Lactic Acid, ED (Not at [facility])     Status: None   Collection Time: 07/14/15  1:24 PM  Result Value Ref Range   Lactic Acid, Venous 0.55 0.5 - 2.0 mmol/L  POC Urine Pregnancy, ED (do NOT order at [facility])     Status: None   Collection Time: 07/14/15  3:11 PM  Result Value Ref Range   Preg Test, Ur NEGATIVE NEGATIVE    Comment:        THE SENSITIVITY OF THIS METHODOLOGY IS >24 mIU/mL     Ct Abdomen Pelvis W Contrast  07/14/2015   CLINICAL DATA:  Knot at the RIGHT-side of the umbilicus with increased redness and swelling common noted at end of August, had laparoscopic LEFT ovarian cystectomy on 06/28/2015, has been treated by antibiotics and pain medication, brown drainage from umbilicus  EXAM: CT ABDOMEN AND PELVIS WITH CONTRAST  TECHNIQUE: Multidetector CT imaging of the abdomen and pelvis was performed using the standard protocol following bolus administration of intravenous contrast. Sagittal and coronal MPR images reconstructed from axial data set.  CONTRAST:  19m OMNIPAQUE IOHEXOL 300 MG/ML SOLN IV. Dilute oral contrast.  COMPARISON:  04/21/2015  FINDINGS: Minimal dependent atelectasis at lung bases.  Gallbladder surgically absent.  Spleen appears enlarged, 15.5 x 6.1 x 14.4 cm.  Liver, spleen, pancreas, kidneys, and adrenal glands otherwise normal appearance.  Incidentally noted retro aortic LEFT renal vein.  Normal appendix.  Large subcutaneous fluid collection containing a small focus of gas identified in the anterior RIGHT abdominal wall extending to the RIGHT lateral margin of the umbilicus, collection measuring 9.7 x 3.3 x 5.2 cm in size consistent with abscess.  Collection appears external to the anterior abdominal wall fascia without intraperitoneal extension.  Associated skin thickening at umbilicus and diffuse surrounding subcutaneous  infiltrative changes.  Subcutaneous edema also identified particularly at the RIGHT flank.  No hernias identified.  Stomach and bowel loops normal appearance.  No intra-abdominal mass, adenopathy, free air or free fluid.  Unremarkable bladder, ureters, uterus and adnexa.  Osseous structures unremarkable.  IMPRESSION: Large subcutaneous abscessed lateral  and inferior to the umbilicus, 9.7 x 3.3 x 5.2 cm with surrounding subcutaneous infiltrative changes and overlying skin thickening.  No intra-abdominal extension of inflammatory process identified.  Mild splenomegaly.   Electronically Signed   By: Lavonia Dana M.D.   On: 07/14/2015 17:11    Review of Systems  Constitutional: Positive for fever and chills.  HENT: Negative.   Eyes: Negative.   Respiratory: Negative.   Cardiovascular: Negative.   Gastrointestinal: Positive for abdominal pain.  Genitourinary: Negative.   Musculoskeletal: Negative.   Skin: Negative.   Neurological: Negative.   Endo/Heme/Allergies: Negative.   Psychiatric/Behavioral: Negative.    Blood pressure 101/43, pulse 89, temperature 99.5 F (37.5 C), temperature source Oral, resp. rate 14, height _0  (1.702 m), weight 97.523 kg (215 lb), last menstrual period 06/29/2015, SpO2 99 %. Physical Exam  Constitutional: She is oriented to person, place, and time. She appears well-developed and well-nourished.  HENT:  Head: Normocephalic and atraumatic.  Eyes: Conjunctivae and EOM are normal. Pupils are equal, round, and reactive to light.  Neck: Normal range of motion. Neck supple.  Cardiovascular: Normal rate, regular rhythm and normal heart sounds.   Respiratory: Effort normal and breath sounds normal.  GI:  There is a large area of tenderness and redness and induration located just below and to the right of the umbilicus  Musculoskeletal: Normal range of motion.  Neurological: She is alert and oriented to person, place, and time.  Skin: Skin is warm and dry.   Psychiatric: She has a normal mood and affect. Her behavior is normal.    Assessment/Plan:  The patient appears to have a wound infection at her infraumbilical port site. I would recommend that this area be opened and drained. Since this patient is a patient of the OB/GYN service the disposition will be per them but I will be happy to go to the operating room with them and helped them incise and drain this subcutaneous abscess. I have discussed this in detail with the patient including the risks and benefits as well as some of the technical aspects including the fact that the wound will be opened and packed and she understands and wishes to proceed  TOTH III,Kynlei Piontek S 07/14/2015, 7:53 PM

## 2015-07-14 NOTE — Progress Notes (Signed)
ANTIBIOTIC CONSULT NOTE - INITIAL  Pharmacy Consult for aztreonam Indication: wound infxn  Allergies  Allergen Reactions  . Amoxicillin Other (See Comments)    Made her very hot and her lips turned blue. She also had frequent urination.  . Erythromycin     Was told not to take it in the hospital  . Penicillins Other (See Comments)    She can take any cillins  . Latex Rash    Patient Measurements: Height: 5\' 7"  (170.2 cm) Weight: 215 lb (97.523 kg) IBW/kg (Calculated) : 61.6 Adjusted Body Weight:   Vital Signs: Temp: 97.9 F (36.6 C) (09/08 1122) Temp Source: Oral (09/08 1122) BP: 103/48 mmHg (09/08 1333) Pulse Rate: 68 (09/08 1333) Intake/Output from previous day:   Intake/Output from this shift:    Labs:  Recent Labs  07/14/15 1250  WBC 12.0*  HGB 12.0  PLT 208  CREATININE 0.71   Estimated Creatinine Clearance: 116.6 mL/min (by C-G formula based on Cr of 0.71). No results for input(s): VANCOTROUGH, VANCOPEAK, VANCORANDOM, GENTTROUGH, GENTPEAK, GENTRANDOM, TOBRATROUGH, TOBRAPEAK, TOBRARND, AMIKACINPEAK, AMIKACINTROU, AMIKACIN in the last 72 hours.   Microbiology: No results found for this or any previous visit (from the past 720 hour(s)).  Medical History: Past Medical History  Diagnosis Date  . Asthma   . Anxiety     Medications:  Anti-infectives    Start     Dose/Rate Route Frequency Ordered Stop   07/14/15 2300  aztreonam (AZACTAM) 1 g in dextrose 5 % 50 mL IVPB     1 g 100 mL/hr over 30 Minutes Intravenous Every 8 hours 07/14/15 1354     07/14/15 1400  vancomycin (VANCOCIN) IVPB 1000 mg/200 mL premix     1,000 mg 200 mL/hr over 60 Minutes Intravenous  Once 07/14/15 1347     07/14/15 1400  aztreonam (AZACTAM) 2 g in dextrose 5 % 50 mL IVPB     2 g 100 mL/hr over 30 Minutes Intravenous  Once 07/14/15 1348       Assessment: 56 yof to start aztreonam for a wound infection. Pt is afebrile and WBC is 12. Scr is WNL and lactic acid is 0.55.    Aztreo 9/8>> Vanc x 1 9/8  Goal of Therapy:  Eradication of infection  Plan:  - Aztreonam 2gm IV x 1 then 1gm IV Q8H - F/u renal fxn, C&S, clinical status - Consider continuing vancomycin if appropriate  Aliya Sol, Rande Lawman 07/14/2015,1:54 PM

## 2015-07-14 NOTE — H&P (Signed)
Sara Booth is an 39 y.o. female  With worsening complaints of abdominal wall pain. The patient had a ovarian cyst removed via laparoscopy 8\23. She reports that she was doing well postoperatively but then started to develop a red tender area on her lower abdomen. She states that she followed up with the surgical group and was started on antibiotics 2 days ago. She states that she's been taking Bactrim. She reports that she has now had redness spread across her lower abdomen on the right and there is a very tender knot. She has also noticed some brownish drainage from her umbilicus. She states today she was nauseated and felt slightly achy but has not documented a fever.  Pertinent Gynecological History: S/p lsc oophorectomty on 8/23 for torsion of ovary and dermoid cycst   Menstrual History:  Patient's last menstrual period was 06/29/2015.    Past Medical History  Diagnosis Date  . Asthma   . Anxiety     Past Surgical History  Procedure Laterality Date  . Cholecystectomy    . Tubal ligation    . Laparoscopic unilateral salpingo oopherectomy Left 06/28/2015    Procedure: LAPAROSCOPIC LEFT SALPINGO OOPHORECTOMY;  Surgeon: Mora Bellman, MD;  Location: [facility];  Service: Gynecology;  Laterality: Left;    Family History  Problem Relation Age of Onset  . Cancer Maternal Grandmother     breast    Social History:  reports that she has been smoking Cigarettes.  She has been smoking about 0.00 packs per day. She does not have any smokeless tobacco history on file. She reports that she does not drink alcohol or use illicit drugs.  Allergies:  Allergies  Allergen Reactions  . Amoxicillin Other (See Comments)    Made her very hot and her lips turned blue. She also had frequent urination.  . Erythromycin     Was told not to take it in the hospital  . Penicillins Other (See Comments)    She can take any cillins  . Latex Rash     (Not in a hospital admission)  Review of Systems   Constitutional: Positive for fever and chills.  Respiratory: Negative.   Cardiovascular: Negative for chest pain.  Gastrointestinal: Positive for abdominal pain.  Skin:       Induration of abdominal skin  Psychiatric/Behavioral: Negative.     Blood pressure 101/43, pulse 89, temperature 99.5 F (37.5 C), temperature source Oral, resp. rate 14, height 5\' 7"  (1.702 m), weight 215 lb (97.523 kg), last menstrual period 06/29/2015, SpO2 99 %. Physical Exam  Vitals reviewed. Constitutional: She is oriented to person, place, and time. She appears well-developed and well-nourished. No distress.  HENT:  Head: Normocephalic and atraumatic.  Eyes: Conjunctivae are normal.  Neck: Neck supple. No thyromegaly present.  Respiratory: Effort normal and breath sounds normal.  GI: There is tenderness.  Skin indurated and painful No rebound  Genitourinary:  deferred  Musculoskeletal: She exhibits tenderness.  Neurological: She is alert and oriented to person, place, and time.  Skin: There is erythema.  Psychiatric: She has a normal mood and affect.    Results for orders placed or performed during the hospital encounter of 07/14/15 (from the past 24 hour(s))  Urinalysis, Routine w reflex microscopic (not at [facility])     Status: None   Collection Time: 07/14/15 12:37 PM  Result Value Ref Range   Color, Urine YELLOW YELLOW   APPearance CLEAR CLEAR   Specific Gravity, Urine 1.012 1.005 - 1.030  pH 6.0 5.0 - 8.0   Glucose, UA NEGATIVE NEGATIVE mg/dL   Hgb urine dipstick NEGATIVE NEGATIVE   Bilirubin Urine NEGATIVE NEGATIVE   Ketones, ur NEGATIVE NEGATIVE mg/dL   Protein, ur NEGATIVE NEGATIVE mg/dL   Urobilinogen, UA 1.0 0.0 - 1.0 mg/dL   Nitrite NEGATIVE NEGATIVE   Leukocytes, UA NEGATIVE NEGATIVE  Comprehensive metabolic panel     Status: Abnormal   Collection Time: 07/14/15 12:50 PM  Result Value Ref Range   Sodium 137 135 - 145 mmol/L   Potassium 3.8 3.5 - 5.1 mmol/L   Chloride 106 101 -  111 mmol/L   CO2 24 22 - 32 mmol/L   Glucose, Bld 96 65 - 99 mg/dL   BUN 6 6 - 20 mg/dL   Creatinine, Ser 0.71 0.44 - 1.00 mg/dL   Calcium 8.7 (L) 8.9 - 10.3 mg/dL   Total Protein 6.2 (L) 6.5 - 8.1 g/dL   Albumin 3.1 (L) 3.5 - 5.0 g/dL   AST 20 15 - 41 U/L   ALT 25 14 - 54 U/L   Alkaline Phosphatase 69 38 - 126 U/L   Total Bilirubin 0.8 0.3 - 1.2 mg/dL   GFR calc non Af Amer >60 >60 mL/min   GFR calc Af Amer >60 >60 mL/min   Anion gap 7 5 - 15  CBC with Differential     Status: Abnormal   Collection Time: 07/14/15 12:50 PM  Result Value Ref Range   WBC 12.0 (H) 4.0 - 10.5 K/uL   RBC 4.00 3.87 - 5.11 MIL/uL   Hemoglobin 12.0 12.0 - 15.0 g/dL   HCT 35.7 (L) 36.0 - 46.0 %   MCV 89.3 78.0 - 100.0 fL   MCH 30.0 26.0 - 34.0 pg   MCHC 33.6 30.0 - 36.0 g/dL   RDW 12.9 11.5 - 15.5 %   Platelets 208 150 - 400 K/uL   Neutrophils Relative % 79 (H) 43 - 77 %   Neutro Abs 9.6 (H) 1.7 - 7.7 K/uL   Lymphocytes Relative 10 (L) 12 - 46 %   Lymphs Abs 1.2 0.7 - 4.0 K/uL   Monocytes Relative 10 3 - 12 %   Monocytes Absolute 1.2 (H) 0.1 - 1.0 K/uL   Eosinophils Relative 1 0 - 5 %   Eosinophils Absolute 0.1 0.0 - 0.7 K/uL   Basophils Relative 0 0 - 1 %   Basophils Absolute 0.0 0.0 - 0.1 K/uL  I-Stat CG4 Lactic Acid, ED (Not at [facility])     Status: None   Collection Time: 07/14/15  1:24 PM  Result Value Ref Range   Lactic Acid, Venous 0.55 0.5 - 2.0 mmol/L  POC Urine Pregnancy, ED (do NOT order at [facility])     Status: None   Collection Time: 07/14/15  3:11 PM  Result Value Ref Range   Preg Test, Ur NEGATIVE NEGATIVE    Ct Abdomen Pelvis W Contrast  07/14/2015   CLINICAL DATA:  Knot at the RIGHT-side of the umbilicus with increased redness and swelling common noted at end of August, had laparoscopic LEFT ovarian cystectomy on 06/28/2015, has been treated by antibiotics and pain medication, brown drainage from umbilicus  EXAM: CT ABDOMEN AND PELVIS WITH CONTRAST  TECHNIQUE: Multidetector CT imaging of  the abdomen and pelvis was performed using the standard protocol following bolus administration of intravenous contrast. Sagittal and coronal MPR images reconstructed from axial data set.  CONTRAST:  120mL OMNIPAQUE IOHEXOL 300 MG/ML SOLN IV. Dilute oral contrast.  COMPARISON:  04/21/2015  FINDINGS: Minimal dependent atelectasis at lung bases.  Gallbladder surgically absent.  Spleen appears enlarged, 15.5 x 6.1 x 14.4 cm.  Liver, spleen, pancreas, kidneys, and adrenal glands otherwise normal appearance.  Incidentally noted retro aortic LEFT renal vein.  Normal appendix.  Large subcutaneous fluid collection containing a small focus of gas identified in the anterior RIGHT abdominal wall extending to the RIGHT lateral margin of the umbilicus, collection measuring 9.7 x 3.3 x 5.2 cm in size consistent with abscess.  Collection appears external to the anterior abdominal wall fascia without intraperitoneal extension.  Associated skin thickening at umbilicus and diffuse surrounding subcutaneous infiltrative changes.  Subcutaneous edema also identified particularly at the RIGHT flank.  No hernias identified.  Stomach and bowel loops normal appearance.  No intra-abdominal mass, adenopathy, free air or free fluid.  Unremarkable bladder, ureters, uterus and adnexa.  Osseous structures unremarkable.  IMPRESSION: Large subcutaneous abscessed lateral and inferior to the umbilicus, 9.7 x 3.3 x 5.2 cm with surrounding subcutaneous infiltrative changes and overlying skin thickening.  No intra-abdominal extension of inflammatory process identified.  Mild splenomegaly.   Electronically Signed   By: Lavonia Dana M.D.   On: 07/14/2015 17:11    Assessment/Plan: 39 yo female with subcutaneous abdominal abscess.  Pt consented to incision and drainage of abdominal wall abscess.  Risks include but not limited to bleeding, infection.  There is a small chance further procedures will be needed if fascia is not intact.  Pt signed consent.  And wiating for OR.   Rolf Fells H. 07/14/2015, 8:05 PM

## 2015-07-14 NOTE — ED Notes (Signed)
Patient transported to CT 

## 2015-07-14 NOTE — ED Notes (Signed)
Pt presents with increased redness and "knot" to  R side abdomen that she noted end of August.  Pt had surgery on 8/23; pt went to surgeon, was placed on abx, pain medication, pt reports pain has worsened, with "brown stuff" coming from umbilicus.

## 2015-07-14 NOTE — Transfer of Care (Signed)
Immediate Anesthesia Transfer of Care Note  Patient: Sara Booth  Procedure(s) Performed: Procedure(s): INCISION AND DRAINAGE UMBILICAL ABSCESS (N/A)  Patient Location: PACU  Anesthesia Type:General  Level of Consciousness: awake, alert , oriented and patient cooperative  Airway & Oxygen Therapy: Patient Spontanous Breathing and Patient connected to nasal cannula oxygen  Post-op Assessment: Report given to RN and Post -op Vital signs reviewed and stable  Post vital signs: Reviewed and stable  Last Vitals:  Filed Vitals:   07/14/15 1934  BP:   Pulse:   Temp: 37.5 C  Resp:     Complications: No apparent anesthesia complications

## 2015-07-14 NOTE — Brief Op Note (Signed)
07/14/2015  9:12 PM  PATIENT:  Sara Booth  39 y.o. female  PRE-OPERATIVE DIAGNOSIS:  UMBILICAL ABSCESS  POST-OPERATIVE DIAGNOSIS:  UMBILICAL ABSCESS  PROCEDURE:  Procedure(s): INCISION AND DRAINAGE UMBILICAL ABSCESS (N/A)  SURGEON:  Surgeon(s) and Role:    * Guss Bunde, MD - Primary    * Autumn Messing III, MD - Assisting  ANESTHESIA:   general  EBL:  Total I/O In: -  Out: 30 [Blood:30]  BLOOD ADMINISTERED:none  DRAINS: none   LOCAL MEDICATIONS USED:  NONE  SPECIMEN:  No Specimen  DISPOSITION OF SPECIMEN:  N/A  COUNTS:  YES  TOURNIQUET:  * No tourniquets in log *  DICTATION: .Dragon Dictation  PLAN OF CARE: Admit to inpatient   PATIENT DISPOSITION:  PACU - hemodynamically stable.   Delay start of Pharmacological VTE agent (>24hrs) due to surgical blood loss or risk of bleeding: yes Will start tomorrow pending evaluation of wound.

## 2015-07-15 ENCOUNTER — Encounter (HOSPITAL_COMMUNITY): Payer: Self-pay | Admitting: Obstetrics & Gynecology

## 2015-07-15 DIAGNOSIS — T8140XA Infection following a procedure, unspecified, initial encounter: Secondary | ICD-10-CM

## 2015-07-15 HISTORY — DX: Infection following a procedure, unspecified, initial encounter: T81.40XA

## 2015-07-15 LAB — URINE CULTURE
CULTURE: NO GROWTH
SPECIAL REQUESTS: NORMAL

## 2015-07-15 MED ORDER — ACETAMINOPHEN 325 MG PO TABS: 650.0000 mg | ORAL_TABLET | ORAL | Status: DC | PRN

## 2015-07-15 MED ORDER — ONDANSETRON HCL 4 MG PO TABS: 4.0000 mg | ORAL_TABLET | Freq: Four times a day (QID) | ORAL | Status: DC | PRN

## 2015-07-15 MED ORDER — CIPROFLOXACIN HCL 500 MG PO TABS
500.0000 mg | ORAL_TABLET | Freq: Two times a day (BID) | ORAL | Status: DC
  Administered 2015-07-15 – 2015-07-16 (×2): 500 mg via ORAL
  Filled 2015-07-15 (×2): qty 1

## 2015-07-15 MED ORDER — MORPHINE SULFATE (PF) 2 MG/ML IV SOLN: 2.0000 mg | INTRAVENOUS | Status: DC | PRN

## 2015-07-15 MED ORDER — MENTHOL 3 MG MT LOZG: 1.0000 | LOZENGE | OROMUCOSAL | Status: DC | PRN

## 2015-07-15 MED ORDER — ENOXAPARIN SODIUM 40 MG/0.4ML ~~LOC~~ SOLN
40.0000 mg | SUBCUTANEOUS | Status: DC
  Administered 2015-07-15 – 2015-07-17 (×3): 40 mg via SUBCUTANEOUS
  Filled 2015-07-15 (×4): qty 0.4

## 2015-07-15 MED ORDER — METRONIDAZOLE 500 MG PO TABS
500.0000 mg | ORAL_TABLET | Freq: Three times a day (TID) | ORAL | Status: DC
  Administered 2015-07-15 – 2015-07-16 (×3): 500 mg via ORAL
  Filled 2015-07-15 (×3): qty 1

## 2015-07-15 MED ORDER — KETOROLAC TROMETHAMINE 30 MG/ML IJ SOLN
30.0000 mg | Freq: Four times a day (QID) | INTRAMUSCULAR | Status: DC
  Administered 2015-07-15 – 2015-07-17 (×10): 30 mg via INTRAVENOUS
  Filled 2015-07-15 (×11): qty 1

## 2015-07-15 MED ORDER — POLYETHYLENE GLYCOL 3350 17 G PO PACK
17.0000 g | PACK | Freq: Every day | ORAL | Status: DC
  Administered 2015-07-15 – 2015-07-18 (×3): 17 g via ORAL
  Filled 2015-07-15 (×5): qty 1

## 2015-07-15 MED ORDER — ONDANSETRON HCL 4 MG/2ML IJ SOLN
4.0000 mg | Freq: Four times a day (QID) | INTRAMUSCULAR | Status: DC | PRN
  Filled 2015-07-15: qty 2

## 2015-07-15 MED ORDER — VANCOMYCIN HCL IN DEXTROSE 1-5 GM/200ML-% IV SOLN
1000.0000 mg | Freq: Three times a day (TID) | INTRAVENOUS | Status: DC
  Administered 2015-07-15 (×2): 1000 mg via INTRAVENOUS
  Filled 2015-07-15 (×4): qty 200

## 2015-07-15 MED ORDER — KETOROLAC TROMETHAMINE 30 MG/ML IJ SOLN
30.0000 mg | Freq: Four times a day (QID) | INTRAMUSCULAR | Status: DC
  Administered 2015-07-15: 30 mg via INTRAMUSCULAR

## 2015-07-15 MED ORDER — OXYCODONE-ACETAMINOPHEN 5-325 MG PO TABS
1.0000 | ORAL_TABLET | ORAL | Status: DC | PRN
  Administered 2015-07-15: 2 via ORAL
  Administered 2015-07-15 – 2015-07-16 (×2): 1 via ORAL
  Administered 2015-07-18: 2 via ORAL
  Filled 2015-07-15: qty 1
  Filled 2015-07-15: qty 2
  Filled 2015-07-15: qty 1
  Filled 2015-07-15: qty 2

## 2015-07-15 NOTE — Progress Notes (Signed)
After discussing with OB attending, we decided to change patient's therapy to PO due to being afebrile and possibly ready for discharge tomorrow. We chose PO ciprofloxacin and metronidazole for intra-abdominal and anaerobic coverage.  Franz Dell, PharmD Neonatal PGY-2 Pharmacy Specialty Resident

## 2015-07-15 NOTE — Care Management Note (Signed)
Case Management Note  Patient Details  Name: CHARNAE LILL MRN: [id number] Date of Birth: 08-15-1976  Subjective/Objective:   Patient chose [facility] for [facility] for wound care.  Referral made to [facility] for [facility] for wound care. Soc will begin 24-48 hrs post dc.                  Action/Plan:   Expected Discharge Date:                  Expected Discharge Plan:  [place]  In-House Referral:     Discharge planning Services  CM Consult  Post Acute Care Choice:    Choice offered to:  Patient  DME Arranged:    DME Agency:     HH Arranged:  RN [place] Agency:  [place]  Status of Service:  Completed, signed off  Medicare Important Message Given:    Date Medicare IM Given:    Medicare IM give by:    Date Additional Medicare IM Given:    Additional Medicare Important Message give by:     If discussed at [place] of Stay Meetings, dates discussed:    Additional Comments:  Zenon Mayo, RN 07/15/2015, 4:05 PM

## 2015-07-15 NOTE — Progress Notes (Signed)
3ANTIBIOTIC CONSULT NOTE - INITIAL  Pharmacy Consult for Vancomycin Indication: abd abscess  Allergies  Allergen Reactions  . Amoxicillin Other (See Comments)    Made her very hot and her lips turned blue. She also had frequent urination.  . Erythromycin     Was told not to take it in the hospital  . Penicillins Other (See Comments)    She can take any cillins  . Latex Rash    Patient Measurements: Height: 5\' 7"  (170.2 cm) Weight: 226 lb 6.6 oz (102.7 kg) IBW/kg (Calculated) : 61.6  Vital Signs: Temp: 99.1 F (37.3 C) (09/08 2327) Temp Source: Oral (09/08 2327) BP: 108/49 mmHg (09/08 2327) Pulse Rate: 78 (09/08 2327) Intake/Output from previous day: 09/08 0701 - 09/09 0700 In: 400 [I.V.:400] Out: 30 [Blood:30] Intake/Output from this shift: Total I/O In: 400 [I.V.:400] Out: 30 [Blood:30]  Labs:  Recent Labs  07/14/15 1250  WBC 12.0*  HGB 12.0  PLT 208  CREATININE 0.71   Estimated Creatinine Clearance: 119.7 mL/min (by C-G formula based on Cr of 0.71). No results for input(s): VANCOTROUGH, VANCOPEAK, VANCORANDOM, GENTTROUGH, GENTPEAK, GENTRANDOM, TOBRATROUGH, TOBRAPEAK, TOBRARND, AMIKACINPEAK, AMIKACINTROU, AMIKACIN in the last 72 hours.   Microbiology: No results found for this or any previous visit (from the past 720 hour(s)).  Medical History: Past Medical History  Diagnosis Date  . Asthma   . Anxiety     Medications:  Prescriptions prior to admission  Medication Sig Dispense Refill Last Dose  . docusate sodium (COLACE) 100 MG capsule Take 1 capsule (100 mg total) by mouth 2 (two) times daily as needed for mild constipation. 30 capsule 2 07/14/2015 at Unknown time  . fluticasone (FLONASE) 50 MCG/ACT nasal spray Place 1 spray into both nostrils daily.   07/13/2015 at Unknown time  . ibuprofen (ADVIL,MOTRIN) 600 MG tablet Take 1 tablet (600 mg total) by mouth every 6 (six) hours as needed for moderate pain. 30 tablet 6 07/14/2015 at Unknown time  .  oxyCODONE-acetaminophen (PERCOCET/ROXICET) 5-325 MG per tablet Take 1-2 tablets by mouth every 4 (four) hours as needed. (Patient taking differently: Take 1-2 tablets by mouth every 4 (four) hours as needed for moderate pain. ) 20 tablet 0 07/14/2015 at Unknown time  . sulfamethoxazole-trimethoprim (BACTRIM DS) 800-160 MG per tablet Take 1 tablet by mouth 2 (two) times daily. 20 tablet 0 07/14/2015 at Unknown time   Assessment: 39 y.o. female presents with abd pain. S/p ovarian cyst removed via laparoscopy 8/23. Pt started on po Bactrim 2 days ago as o/p. Found to have subcutaneous abd wall abscess - s/p I&D 9/8. To continue Aztreonam and Vancomycin post-op. Vancomycin 1gm IV given ~1410. SCr 0.71, est normalized CrCl > 100 ml/min. WBC elevated to 12. Afeb.  Goal of Therapy:  Vancomycin trough level 15-20 mcg/ml  Plan:  Vancomycin 1gm IV q8h Will f/u renal function, micro data, and pt's clinical condition Will f/u trough at [facility]  Sherlon Handing, PharmD, BCPS Clinical pharmacist, pager [phone] 07/15/2015,2:08 AM

## 2015-07-15 NOTE — Progress Notes (Signed)
1 Day Post-Op Procedure(s) (LRB): INCISION AND DRAINAGE UMBILICAL ABSCESS (N/A)  Subjective: Patient reports tolerating PO and no problems voiding.  Patient has passed flatus but no BM.  She was constipated before surgery. Pt denies CP, SOB, N/V.  Pt feels much better after the surgery  Objective: I have reviewed patient's vital signs, intake and output, medications, labs, microbiology and radiology results.  General: alert, cooperative and no distress Resp: normal effort GI: soft, mildly tender over central part of abdomen but improved from yesterday. Extremities: extremities normal, atraumatic, no cyanosis or edema; negative Homan's  Assessment: s/p Procedure(s): INCISION AND DRAINAGE UMBILICAL ABSCESS (N/A): stable  Plan: Advance diet Encourage ambulation Advance to PO medication Continue ABX therapy due to Post-op infection Lovenox today  Set up home health for dressing changes. First dressing change tomorrow a.m.; pt oozing at surgery close.  Pt packed tight with Kerlex.  LOS: 1 day    LEGGETT,KELLY H. 07/15/2015, 9:29 AM

## 2015-07-15 NOTE — Progress Notes (Signed)
Pt admitted to room 531 from pacu. Alert and oriented x4 moe x4 . Dressing to abd dry and intact. Iv patent. Pt states pain a '2 " on 1-10 pain scale.Pt given safety information  And verbalized understanding. vss

## 2015-07-15 NOTE — Care Management Note (Signed)
Case Management Note  Patient Details  Name: Sara Booth MRN: [id number] Date of Birth: 10/25/1976  Subjective/Objective:   Patient lives with spouse, patient is indep pta,  Patient has a sister who is a Therapist, sports, she will be able to help with dressing if needed. Patient has transportation at discharge.  Patient also states her BCBS will be running out, so she will not have insurance and she will need a pcp.  NCM set patient up with [facility] with [facility] rep  [facility].  NCM will cont to follow for dc needs.                  Action/Plan:   Expected Discharge Date:                  Expected Discharge Plan:  [place]  In-House Referral:     Discharge planning Services  CM Consult  Post Acute Care Choice:    Choice offered to:     DME Arranged:    DME Agency:     HH Arranged:    [place] Agency:     Status of Service:  In process, will continue to follow  Medicare Important Message Given:    Date Medicare IM Given:    Medicare IM give by:    Date Additional Medicare IM Given:    Additional Medicare Important Message give by:     If discussed at [place] of Stay Meetings, dates discussed:    Additional Comments:  Zenon Mayo, RN 07/15/2015, 2:18 PM

## 2015-07-16 LAB — CREATININE, SERUM: Creatinine, Ser: 0.74 mg/dL (ref 0.44–1.00)

## 2015-07-16 MED ORDER — LEVOFLOXACIN IN D5W 750 MG/150ML IV SOLN
750.0000 mg | INTRAVENOUS | Status: DC
  Administered 2015-07-16 – 2015-07-18 (×3): 750 mg via INTRAVENOUS
  Filled 2015-07-16 (×3): qty 150

## 2015-07-16 MED ORDER — VANCOMYCIN HCL IN DEXTROSE 1-5 GM/200ML-% IV SOLN
1000.0000 mg | Freq: Three times a day (TID) | INTRAVENOUS | Status: DC
  Administered 2015-07-16 – 2015-07-17 (×4): 1000 mg via INTRAVENOUS
  Filled 2015-07-16 (×8): qty 200

## 2015-07-16 NOTE — Progress Notes (Signed)
2 Days Post-Op Procedure(s) (LRB): INCISION AND DRAINAGE UMBILICAL ABSCESS (N/A)  Subjective: Patient reports incisional pain, tolerating PO and no problems voiding.   Needs pain medication for dressing changes. Transferred by Dr Elonda Husky from [facility] this afternoon. Objective: I have reviewed patient's vital signs, intake and output and medications.  General: alert, cooperative and no distress GI: dressing intact, mild tenderness  Assessment: s/p Procedure(s): INCISION AND DRAINAGE UMBILICAL ABSCESS (N/A): stable and progressing well  Plan: Encourage ambulation Continue ABX therapy due to Post-op infection  LOS: 2 days    ARNOLD,JAMES 07/16/2015, 9:28 PM

## 2015-07-16 NOTE — ED Provider Notes (Signed)
Pt here with abdominal pain s/p laparoscopic oophorectomy, has a post-surgical abscess.  Plan to admit for surgery to OBGYN and General Surgery for management.   Quintella Reichert, MD 07/16/15 1600

## 2015-07-16 NOTE — Progress Notes (Signed)
Patient being transferred to [facility] hospital for closer monitoring per MD. Report has been given to receiving nurse. Patient has been transfer to [facility] via [place]. Vital signs stable. Patient has PIV to the right hand that is saline locked. Clean, dry and intact. No signs of wounds. Patient has abdominal incision that was changed 07/16/15. Dressing was clean, dry and intact.

## 2015-07-16 NOTE — Progress Notes (Signed)
ANTIBIOTIC CONSULT NOTE - INITIAL  Pharmacy Consult for vancomycin  Indication: Abscess  Allergies  Allergen Reactions  . Amoxicillin Other (See Comments)    Made her very hot and her lips turned blue. She also had frequent urination.  . Erythromycin     Was told not to take it in the hospital  . Penicillins Other (See Comments)    She can take any cillins  . Latex Rash    Patient Measurements: Height: 5\' 7"  (170.2 cm) Weight: 226 lb 6.6 oz (102.7 kg) IBW/kg (Calculated) : 61.6  Vital Signs: Temp: 98.2 F (36.8 C) (09/10 0658) Temp Source: Oral (09/10 0658) BP: 109/51 mmHg (09/10 0658) Pulse Rate: 64 (09/10 0658) Intake/Output from previous day: 09/09 0701 - 09/10 0700 In: 666 [P.O.:666] Out: 1400 [Urine:1400] Intake/Output from this shift:    Labs:  Recent Labs  07/14/15 1250  WBC 12.0*  HGB 12.0  PLT 208  CREATININE 0.71   Estimated Creatinine Clearance: 119.7 mL/min (by C-G formula based on Cr of 0.71). No results for input(s): VANCOTROUGH, VANCOPEAK, VANCORANDOM, GENTTROUGH, GENTPEAK, GENTRANDOM, TOBRATROUGH, TOBRAPEAK, TOBRARND, AMIKACINPEAK, AMIKACINTROU, AMIKACIN in the last 72 hours.   Microbiology: Recent Results (from the past 720 hour(s))  Urine culture     Status: None   Collection Time: 07/14/15 12:37 PM  Result Value Ref Range Status   Specimen Description URINE, CLEAN CATCH  Final   Special Requests Normal  Final   Culture NO GROWTH 1 DAY  Final   Report Status 07/15/2015 FINAL  Final  Blood culture (routine x 2)     Status: None (Preliminary result)   Collection Time: 07/14/15 12:50 PM  Result Value Ref Range Status   Specimen Description BLOOD RIGHT ARM  Final   Special Requests BOTTLES DRAWN AEROBIC AND ANAEROBIC 5CC  Final   Culture NO GROWTH < 24 HOURS  Final   Report Status PENDING  Incomplete  Blood culture (routine x 2)     Status: None (Preliminary result)   Collection Time: 07/14/15  1:00 PM  Result Value Ref Range Status   Specimen Description BLOOD LEFT ARM  Final   Special Requests BOTTLES DRAWN AEROBIC AND ANAEROBIC 10CC  Final   Culture NO GROWTH < 24 HOURS  Final   Report Status PENDING  Incomplete  Anaerobic culture     Status: None (Preliminary result)   Collection Time: 07/14/15  8:46 PM  Result Value Ref Range Status   Specimen Description ABSCESS UMBILICUS  Final   Special Requests NONE  Final   Gram Stain   Final    ABUNDANT WBC PRESENT, PREDOMINANTLY PMN NO SQUAMOUS EPITHELIAL CELLS SEEN ABUNDANT GRAM POSITIVE COCCI IN PAIRS MODERATE GRAM NEGATIVE RODS Performed at [facility]    Culture   Final    NO ANAEROBES ISOLATED; CULTURE IN PROGRESS FOR 5 DAYS Performed at [facility]    Report Status PENDING  Incomplete  Culture, routine-abscess     Status: None (Preliminary result)   Collection Time: 07/14/15  8:46 PM  Result Value Ref Range Status   Specimen Description ABSCESS UMBILICUS  Final   Special Requests NONE  Final   Gram Stain   Final    ABUNDANT WBC PRESENT, PREDOMINANTLY PMN NO SQUAMOUS EPITHELIAL CELLS SEEN ABUNDANT GRAM POSITIVE COCCI IN PAIRS MODERATE GRAM NEGATIVE RODS Performed at [facility]    Culture   Final    Culture reincubated for better growth Performed at [facility]    Report  Status PENDING  Incomplete    Medical History: Past Medical History  Diagnosis Date  . Asthma   . Anxiety     Medications:  Scheduled:  . enoxaparin (LOVENOX) injection  40 mg Subcutaneous Q24H  . ketorolac  30 mg Intravenous 4 times per day   Or  . ketorolac  30 mg Intramuscular 4 times per day  . levofloxacin (LEVAQUIN) IV  750 mg Intravenous Q24H  . polyethylene glycol  17 g Oral Daily  . vancomycin  1,000 mg Intravenous Q8H   Assessment: 39 y.o. female presents with abd pain. S/p ovarian cyst removed via laparoscopy 8/23. Found to have subcutaneous abd wall abscess - s/p I&D 9/8. Pharmacy consulted to restart vancomycin after abscess  cultures returned with GPCs.  PMH: asthma, anxiety   WBC 12 (no labs since 9/8). Afeb. Noted pt on po bactrim x 2 days as o/p.  Aztreonam 9/8>>9/9 Vancomycin 9/8>>9/9. Restart 9/10> Cipro 9/9>>9/10 Metronidazole 9/9>>9/10 Levofloxacin 9/10 >  9/8 Blood x 2 > NGTD 9/8 Urine NG 9/8 Abscess > GPC in pairs, [place]  Nephrology: SCr 0.71 (no labs since 9/8)  Goal of Therapy:  Vancomycin trough level 15-20 mcg/ml  Plan:  MD Started levofloxacin 750 mg q24h Starting vancomycin 1g q8h Will f/u renal function, micro data, and pt's clinical condition. VT at [facility], PharmD Clinical Pharmacy Resident Pager # [phone] 07/16/2015 10:30 AM

## 2015-07-17 LAB — VANCOMYCIN, TROUGH: VANCOMYCIN TR: 11 ug/mL (ref 10.0–20.0)

## 2015-07-17 MED ORDER — KETOROLAC TROMETHAMINE 10 MG PO TABS
10.0000 mg | ORAL_TABLET | Freq: Three times a day (TID) | ORAL | Status: DC
Start: 2015-07-17 — End: 2015-07-18
  Administered 2015-07-17 – 2015-07-18 (×4): 10 mg via ORAL
  Filled 2015-07-17 (×7): qty 1

## 2015-07-17 MED ORDER — FLUTICASONE PROPIONATE 50 MCG/ACT NA SUSP
2.0000 | Freq: Every day | NASAL | Status: DC
  Administered 2015-07-17 – 2015-07-18 (×2): 2 via NASAL
  Filled 2015-07-17: qty 16

## 2015-07-17 MED ORDER — VANCOMYCIN HCL 10 G IV SOLR
1500.0000 mg | Freq: Three times a day (TID) | INTRAVENOUS | Status: DC
  Administered 2015-07-17 – 2015-07-18 (×3): 1500 mg via INTRAVENOUS
  Filled 2015-07-17 (×5): qty 1500

## 2015-07-17 NOTE — Progress Notes (Signed)
ANTIBIOTIC CONSULT NOTE - FOLLOW UP  Pharmacy Consult for vancomycin Indication: abscess  Allergies  Allergen Reactions  . Amoxicillin Other (See Comments)    Made her very hot and her lips turned blue. She also had frequent urination.  . Erythromycin     Was told not to take it in the hospital  . Penicillins Other (See Comments)    She can take any cillins  . Latex Rash    Patient Measurements: Height: 5\' 7"  (170.2 cm) Weight: 226 lb 6.6 oz (102.7 kg) IBW/kg (Calculated) : 61.6  Vital Signs: Temp: 98.3 F (36.8 C) (09/11 1151) Temp Source: Oral (09/11 1151) BP: 124/58 mmHg (09/11 1151) Pulse Rate: 56 (09/11 1151) Intake/Output from previous day: 09/10 0701 - 09/11 0700 In: 697 [P.O.:697] Out: 2350 [Urine:2350] Intake/Output from this shift: Total I/O In: 480 [P.O.:480] Out: 550 [Urine:550]  Labs:  Recent Labs  07/16/15 1105  CREATININE 0.74   Estimated Creatinine Clearance: 119.7 mL/min (by C-G formula based on Cr of 0.74).  Recent Labs  07/17/15 1103  [place] 11     Microbiology: Recent Results (from the past 720 hour(s))  Urine culture     Status: None   Collection Time: 07/14/15 12:37 PM  Result Value Ref Range Status   Specimen Description URINE, CLEAN CATCH  Final   Special Requests Normal  Final   Culture NO GROWTH 1 DAY  Final   Report Status 07/15/2015 FINAL  Final  Blood culture (routine x 2)     Status: None (Preliminary result)   Collection Time: 07/14/15 12:50 PM  Result Value Ref Range Status   Specimen Description BLOOD RIGHT ARM  Final   Special Requests BOTTLES DRAWN AEROBIC AND ANAEROBIC 5CC  Final   Culture NO GROWTH 2 DAYS  Final   Report Status PENDING  Incomplete  Blood culture (routine x 2)     Status: None (Preliminary result)   Collection Time: 07/14/15  1:00 PM  Result Value Ref Range Status   Specimen Description BLOOD LEFT ARM  Final   Special Requests BOTTLES DRAWN AEROBIC AND ANAEROBIC 10CC  Final   Culture NO  GROWTH 2 DAYS  Final   Report Status PENDING  Incomplete  Anaerobic culture     Status: None (Preliminary result)   Collection Time: 07/14/15  8:46 PM  Result Value Ref Range Status   Specimen Description ABSCESS UMBILICUS  Final   Special Requests NONE  Final   Gram Stain   Final    ABUNDANT WBC PRESENT, PREDOMINANTLY PMN NO SQUAMOUS EPITHELIAL CELLS SEEN ABUNDANT GRAM POSITIVE COCCI IN PAIRS MODERATE GRAM NEGATIVE RODS Performed at [facility]    Culture   Final    NO ANAEROBES ISOLATED; CULTURE IN PROGRESS FOR 5 DAYS Performed at [facility]    Report Status PENDING  Incomplete  Culture, routine-abscess     Status: None (Preliminary result)   Collection Time: 07/14/15  8:46 PM  Result Value Ref Range Status   Specimen Description ABSCESS UMBILICUS  Final   Special Requests NONE  Final   Gram Stain   Final    ABUNDANT WBC PRESENT, PREDOMINANTLY PMN NO SQUAMOUS EPITHELIAL CELLS SEEN ABUNDANT GRAM POSITIVE COCCI IN PAIRS MODERATE GRAM NEGATIVE RODS Performed at [facility]    Culture   Final    MODERATE MICROAEROPHILIC STREPTOCOCCI Note: Standardized susceptibility testing for this organism is not available. Performed at [facility]    Report Status PENDING  Incomplete  Anti-infectives    Start     Dose/Rate Route Frequency Ordered Stop   07/17/15 2000  vancomycin (VANCOCIN) 1,500 mg in sodium chloride 0.9 % 500 mL IVPB     1,500 mg 250 mL/hr over 120 Minutes Intravenous Every 8 hours 07/17/15 1442     07/16/15 1130  vancomycin (VANCOCIN) IVPB 1000 mg/200 mL premix  Status:  Discontinued     1,000 mg 200 mL/hr over 60 Minutes Intravenous Every 8 hours 07/16/15 1027 07/17/15 1442   07/16/15 1000  levofloxacin (LEVAQUIN) IVPB 750 mg     750 mg 100 mL/hr over 90 Minutes Intravenous Every 24 hours 07/16/15 0950     07/15/15 1600  ciprofloxacin (CIPRO) tablet 500 mg  Status:  Discontinued     500 mg Oral Every 12 hours 07/15/15 1543  07/16/15 0951   07/15/15 1600  metroNIDAZOLE (FLAGYL) tablet 500 mg  Status:  Discontinued     500 mg Oral 3 times per day 07/15/15 1543 07/16/15 0951   07/15/15 0300  vancomycin (VANCOCIN) IVPB 1000 mg/200 mL premix  Status:  Discontinued     1,000 mg 200 mL/hr over 60 Minutes Intravenous Every 8 hours 07/15/15 0217 07/15/15 1541   07/14/15 2300  aztreonam (AZACTAM) 1 g in dextrose 5 % 50 mL IVPB  Status:  Discontinued     1 g 100 mL/hr over 30 Minutes Intravenous Every 8 hours 07/14/15 1354 07/15/15 1541   07/14/15 1400  vancomycin (VANCOCIN) IVPB 1000 mg/200 mL premix     1,000 mg 200 mL/hr over 60 Minutes Intravenous  Once 07/14/15 1347 07/14/15 1535   07/14/15 1400  aztreonam (AZACTAM) 2 g in dextrose 5 % 50 mL IVPB     2 g 100 mL/hr over 30 Minutes Intravenous  Once 07/14/15 1348 07/14/15 1622      Assessment: 39 y.o. female presents with abd pain. S/p ovarian cyst removed via laparoscopy 8/23. Found to have subcutaneous abd wall abscess - s/p I&D 9/8. Pharmacy consulted to restart vancomycin after abscess cultures returned with GPCs.  PMH: asthma, anxiety  WBC 12 (no labs since 9/8). Afeb. Noted pt on po bactrim x 2 days as o/p.  Aztreonam 9/8>>9/9 Vancomycin 9/8>>9/9. Restart 9/10> Cipro 9/9>>9/10 Metronidazole 9/9>>9/10 Levofloxacin 9/10 >  9/8 Blood x 2 > NGTD 9/8 Urine NG 9/8 Abscess > GPC in pairs, GNR > Moderate microaerophilic streptococci  Nephrology: SCr 0.74  Level, drawn appropriately prior to 4th dose: 11 mcg/ml  Goal of Therapy:  Vancomycin trough level 15-20 mcg/ml  Plan:  Discussed with MD after culture resulted in strep. High suspicion for other microbes growing in culture. Will continue vancomycin for now.   Based on trough of 11, increased dose to 1500 mg Q8H with a trough due at steady state with new dose. Will continue to monitor renal function, cultures, and clinical improvement.   Sara Booth Sara Booth 07/17/2015,3:12 PM

## 2015-07-18 ENCOUNTER — Ambulatory Visit: Payer: BLUE CROSS/BLUE SHIELD | Admitting: Obstetrics and Gynecology

## 2015-07-18 LAB — CULTURE, ROUTINE-ABSCESS

## 2015-07-18 MED ORDER — CEFUROXIME AXETIL 500 MG PO TABS
500.0000 mg | ORAL_TABLET | Freq: Two times a day (BID) | ORAL | Status: DC
  Filled 2015-07-18: qty 1

## 2015-07-18 MED ORDER — OXYCODONE-ACETAMINOPHEN 5-325 MG PO TABS: 1.0000 | ORAL_TABLET | ORAL | Status: DC | PRN

## 2015-07-18 MED ORDER — KETOROLAC TROMETHAMINE 10 MG PO TABS: 10.0000 mg | ORAL_TABLET | Freq: Three times a day (TID) | ORAL | Status: DC

## 2015-07-18 MED ORDER — IBUPROFEN 600 MG PO TABS: 600.0000 mg | ORAL_TABLET | Freq: Four times a day (QID) | ORAL | Status: DC | PRN

## 2015-07-18 MED ORDER — CEFUROXIME AXETIL 500 MG PO TABS
500.0000 mg | ORAL_TABLET | Freq: Two times a day (BID) | ORAL | Status: DC
  Administered 2015-07-18: 500 mg via ORAL
  Filled 2015-07-18 (×2): qty 1

## 2015-07-18 MED ORDER — CEFUROXIME AXETIL 500 MG PO TABS: 500.0000 mg | ORAL_TABLET | Freq: Two times a day (BID) | ORAL | Status: DC

## 2015-07-18 MED ORDER — CIPROFLOXACIN HCL 500 MG PO TABS: 500.0000 mg | ORAL_TABLET | Freq: Two times a day (BID) | ORAL | Status: DC

## 2015-07-18 NOTE — Care Management Note (Signed)
Case Management Note  Patient Details  Name: Sara Booth MRN: [id number] Date of Birth: 1975/12/11  Subjective/Objective:         Wound care            Action/Plan:  CM met with patient this am in room.  Patient stated she had met with CM while at [facility] and was given choice of [place] agency and she chose [place].  CM talked to patient regarding her insurance and she stated her BCBS ran out on 07/07/15 of this month.  CM called the financial counselor Charlean Sanfilippo and she met with patient and talked to her about applying for Medicaid.  Gerrard with [facility] had already had the  referral from weekend.  Kristen with [facility]  talked to patient via phone and called CM and plans to have this case as a charity case since patient has no insurance.  RN with [facility] to start in the am on 07/19/15 Tuesday for BID dressing changes- wet to dry.  MD Dr Elonda Husky had explained to patient and husband how to change dressings but patient felt like she needed [facility] if it could be provided.  CM talked to Dr. Nehemiah Settle and he completed face to face and ordered [facility] RN  for patient.  Patient has a appointment already with [facility] this month for PCP so CM called and talked to [place] there in the pharmacy and she stated they would waive the fee for her prescriptions of Ceftin and Motrin and there will be no cost of those 2 medicines since patient has no insurance.  Patient verbalized understanding of care with no questions.  Phone number of [facility], CM and also [facility] given to patient.  Expected Discharge Date:                  Expected Discharge Plan:  [place]  In-House Referral:     Discharge planning Services  CM Consult  Post Acute Care Choice:    Choice offered to:  Patient  DME Arranged:    DME Agency:     HH Arranged:  RN [place] Agency:  [place]  Status of Service:  Completed, signed off  Medicare Important  Message Given:    Date Medicare IM Given:    Medicare IM give by:    Date Additional Medicare IM Given:    Additional Medicare Important Message give by:     If discussed at [place] of Stay Meetings, dates discussed:    Additional Comments:  Yong Channel, RN 07/18/2015, 12:11 PM

## 2015-07-18 NOTE — Care Management (Signed)
Patient did not have transportation to go to the [facility] [place] prior to it closing today.  Patient needed her medications because she is discharging home this pm.  CM went to the [facility] and picked up patient's medicine- Motrin and Cefuroxime 500 mg and brought to patient in room.  There was no cost to patient- the [facility] waived the fee for the patient.  No other needs identified at this time.

## 2015-07-18 NOTE — Progress Notes (Signed)
Dressing change at [place] c husband at bedside. Reviewed needed materials,  Reviewed importance of good hand hygiene, use of gloves, while doing dressing change.  Husband verbalizes understanding of d/c instructions.  Pt [place] RN to come  tomorrow to do 1st dressing change and husband will be responsible for the second.  Pt and husband tolerated well. Filbert Berthold RN

## 2015-07-18 NOTE — Discharge Instructions (Signed)
Dressing Change °A dressing is a material placed over wounds. It keeps the wound clean, dry, and protected from further injury. This provides an environment that favors wound healing.  °BEFORE YOU BEGIN °· Get your supplies together. Things you may need include: °¨ Saline solution. °¨ Flexible gauze dressing. °¨ Medicated cream. °¨ Tape. °¨ Gloves. °¨ Abdominal dressing pads. °¨ Gauze squares. °¨ Plastic bags. °· Take pain medicine 30 minutes before the dressing change if you need it. °· Take a shower before you do the first dressing change of the day. Use plastic wrap or a plastic bag to prevent the dressing from getting wet. °REMOVING YOUR OLD DRESSING  °· Wash your hands with soap and water. Dry your hands with a clean towel. °· Put on your gloves. °· Remove any tape. °· Carefully remove the old dressing. If the dressing sticks, you may dampen it with warm water to loosen it, or follow your caregiver's specific directions. °· Remove any gauze or packing tape that is in your wound. °· Take off your gloves. °· Put the gloves, tape, gauze, or any packing tape into a plastic bag. °CHANGING YOUR DRESSING °· Open the supplies. °· Take the cap off the saline solution. °· Open the gauze package so that the gauze remains on the inside of the package. °· Put on your gloves. °· Clean your wound as told by your caregiver. °· If you have been told to keep your wound dry, follow those instructions. °· Your caregiver may tell you to do one or more of the following: °¨ Pick up the gauze. Pour the saline solution over the gauze. Squeeze out the extra saline solution. °¨ Put medicated cream or other medicine on your wound if you have been told to do so. °¨ Put the solution soaked gauze only in your wound, not on the skin around it. °¨ Pack your wound loosely or as told by your caregiver. °¨ Put dry gauze on your wound. °¨ Put abdominal dressing pads over the dry gauze if your wet gauze soaks through. °· Tape the abdominal dressing  pads in place so they will not fall off. Do not wrap the tape completely around the affected part (arm, leg, abdomen). °· Wrap the dressing pads with a flexible gauze dressing to secure it in place. °· Take off your gloves. Put them in the plastic bag with the old dressing. Tie the bag shut and throw it away. °· Keep the dressing clean and dry until your next dressing change. °· Wash your hands. °SEEK MEDICAL CARE IF: °· Your skin around the wound looks red. °· Your wound feels more tender or sore. °· You see pus in the wound. °· Your wound smells bad. °· You have a fever. °· Your skin around the wound has a rash that itches and burns. °· You see black or yellow skin in your wound that was not there before. °· You feel nauseous, throw up, and feel very tired. °Document Released: 11/29/2004 Document Revised: 01/14/2012 Document Reviewed: 09/03/2011 °ExitCare® Patient Information ©2015 [employer]. This information is not intended to replace advice given to you by your health care provider. Make sure you discuss any questions you have with your health care provider. ° °

## 2015-07-19 LAB — ANAEROBIC CULTURE

## 2015-07-19 LAB — CULTURE, BLOOD (ROUTINE X 2)
CULTURE: NO GROWTH
Culture: NO GROWTH

## 2015-07-21 ENCOUNTER — Encounter: Payer: Self-pay | Admitting: Obstetrics & Gynecology

## 2015-07-21 ENCOUNTER — Ambulatory Visit (INDEPENDENT_AMBULATORY_CARE_PROVIDER_SITE_OTHER): Payer: Self-pay | Admitting: Obstetrics & Gynecology

## 2015-07-21 VITALS — BP 129/73 | HR 55 | Temp 98.3°F | Wt 231.0 lb

## 2015-07-21 DIAGNOSIS — T8140XA Infection following a procedure, unspecified, initial encounter: Secondary | ICD-10-CM

## 2015-07-21 DIAGNOSIS — Z9889 Other specified postprocedural states: Secondary | ICD-10-CM

## 2015-07-21 MED ORDER — FLUCONAZOLE 150 MG PO TABS: 150.0000 mg | ORAL_TABLET | Freq: Once | ORAL | Status: DC

## 2015-07-21 NOTE — Progress Notes (Signed)
Subjective, cc: had I&D of postop hematoma for f/u     Sara Booth is a 39 y.o. 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 Eating a regular diet without difficulty. Bowel movements are normal. Pain is controlled with current analgesics. Medications being used: narcotic analgesics including percocet and  .  The following portions of the patient's history were reviewed and updated as appropriate: allergies, current medications, past family history, past medical history, past social history, past surgical history and problem list.  Review of Systems Pertinent items are noted in HPI.   Wound care BID, temp to 99.1. Sx of yeast Objective:    BP 129/73 mmHg  Pulse 55  Temp(Src) 98.3 F (36.8 C)  Wt 231 lb (104.781 kg)  LMP 06/29/2015 General:  alert, cooperative and no distress  Abdomen: soft, bowel sounds active, non-tender  Incision:   open 5x1 cm, packing, no cellulitis     Assessment:    Postoperative course complicated by open wound after I&D Operative findings again reviewed. Pathology report discussed.    Plan:    1. Continue any current medications. 2. Wound care discussed. 3. Activity restrictions: none 4. Anticipated return to work: 1-2 weeks. 5. Follow up: 1 week Dr Elly Modena  Woodroe Mode, MD 07/21/2015

## 2015-07-21 NOTE — Patient Instructions (Signed)
Dressing Change °A dressing is a material placed over wounds. It keeps the wound clean, dry, and protected from further injury. This provides an environment that favors wound healing.  °BEFORE YOU BEGIN °· Get your supplies together. Things you may need include: °¨ Saline solution. °¨ Flexible gauze dressing. °¨ Medicated cream. °¨ Tape. °¨ Gloves. °¨ Abdominal dressing pads. °¨ Gauze squares. °¨ Plastic bags. °· Take pain medicine 30 minutes before the dressing change if you need it. °· Take a shower before you do the first dressing change of the day. Use plastic wrap or a plastic bag to prevent the dressing from getting wet. °REMOVING YOUR OLD DRESSING  °· Wash your hands with soap and water. Dry your hands with a clean towel. °· Put on your gloves. °· Remove any tape. °· Carefully remove the old dressing. If the dressing sticks, you may dampen it with warm water to loosen it, or follow your caregiver's specific directions. °· Remove any gauze or packing tape that is in your wound. °· Take off your gloves. °· Put the gloves, tape, gauze, or any packing tape into a plastic bag. °CHANGING YOUR DRESSING °· Open the supplies. °· Take the cap off the saline solution. °· Open the gauze package so that the gauze remains on the inside of the package. °· Put on your gloves. °· Clean your wound as told by your caregiver. °· If you have been told to keep your wound dry, follow those instructions. °· Your caregiver may tell you to do one or more of the following: °¨ Pick up the gauze. Pour the saline solution over the gauze. Squeeze out the extra saline solution. °¨ Put medicated cream or other medicine on your wound if you have been told to do so. °¨ Put the solution soaked gauze only in your wound, not on the skin around it. °¨ Pack your wound loosely or as told by your caregiver. °¨ Put dry gauze on your wound. °¨ Put abdominal dressing pads over the dry gauze if your wet gauze soaks through. °· Tape the abdominal dressing  pads in place so they will not fall off. Do not wrap the tape completely around the affected part (arm, leg, abdomen). °· Wrap the dressing pads with a flexible gauze dressing to secure it in place. °· Take off your gloves. Put them in the plastic bag with the old dressing. Tie the bag shut and throw it away. °· Keep the dressing clean and dry until your next dressing change. °· Wash your hands. °SEEK MEDICAL CARE IF: °· Your skin around the wound looks red. °· Your wound feels more tender or sore. °· You see pus in the wound. °· Your wound smells bad. °· You have a fever. °· Your skin around the wound has a rash that itches and burns. °· You see black or yellow skin in your wound that was not there before. °· You feel nauseous, throw up, and feel very tired. °Document Released: 11/29/2004 Document Revised: 01/14/2012 Document Reviewed: 09/03/2011 °ExitCare® Patient Information ©2015 [employer]. This information is not intended to replace advice given to you by your health care provider. Make sure you discuss any questions you have with your health care provider. ° °

## 2015-07-22 ENCOUNTER — Ambulatory Visit: Payer: Self-pay | Attending: Family Medicine | Admitting: Family Medicine

## 2015-07-22 ENCOUNTER — Encounter: Payer: Self-pay | Admitting: Family Medicine

## 2015-07-22 DIAGNOSIS — L02216 Cutaneous abscess of umbilicus: Secondary | ICD-10-CM | POA: Insufficient documentation

## 2015-07-22 DIAGNOSIS — G8929 Other chronic pain: Secondary | ICD-10-CM

## 2015-07-22 DIAGNOSIS — B373 Candidiasis of vulva and vagina: Secondary | ICD-10-CM

## 2015-07-22 DIAGNOSIS — R1031 Right lower quadrant pain: Secondary | ICD-10-CM

## 2015-07-22 DIAGNOSIS — B3731 Acute candidiasis of vulva and vagina: Secondary | ICD-10-CM

## 2015-07-22 HISTORY — DX: Cutaneous abscess of umbilicus: L02.216

## 2015-07-22 LAB — POCT URINALYSIS DIPSTICK
BILIRUBIN UA: NEGATIVE
GLUCOSE UA: NEGATIVE
KETONES UA: NEGATIVE
Leukocytes, UA: NEGATIVE
NITRITE UA: NEGATIVE
PH UA: 7
Protein, UA: NEGATIVE
RBC UA: NEGATIVE
SPEC GRAV UA: 1.01
Urobilinogen, UA: 0.2

## 2015-07-22 MED ORDER — CLOTRIMAZOLE 1 % EX CREA: 1.0000 "application " | TOPICAL_CREAM | Freq: Two times a day (BID) | CUTANEOUS | Status: DC

## 2015-07-22 NOTE — Patient Instructions (Signed)

## 2015-07-22 NOTE — Progress Notes (Signed)
Subjective:  Patient ID: Sara Booth, female    DOB: 04/16/1976  Age: 39 y.o. MRN: [id number]  CC: Hospital follow-up.  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 She complains of right lower quadrant pain which has been on for the last few days and associated swelling of her right lower quadrant but denies constipation or diarrhea. Her temperatures have been in the 99.1range. She saw her GYN yesterday where she had complained of curdy vaginal discharge and itching and was placed on Diflucan pill which she just picked up and is yet to take.  Past Medical History  Diagnosis Date  . Asthma   . Anxiety     Past Surgical History  Procedure Laterality Date  . Cholecystectomy    . Tubal ligation    . Laparoscopic unilateral salpingo oopherectomy Left 06/28/2015    Procedure: LAPAROSCOPIC LEFT SALPINGO OOPHORECTOMY;  Surgeon: Mora Bellman, MD;  Location: [facility];  Service: Gynecology;  Laterality: Left;  . Incision and drainage abscess N/A 07/14/2015    Procedure: INCISION AND DRAINAGE UMBILICAL ABSCESS;  Surgeon: Guss Bunde, MD;  Location: [facility];  Service: Gynecology;  Laterality: N/A;    Social History   Social History  . Marital Status: Married    Spouse Name: N/A  . Number of Children: N/A  . Years of Education: N/A   Occupational History  . Not on file.   Social History Main Topics  . Smoking status: Former Smoker -- 0.00 packs/day    Types: Cigarettes  . Smokeless tobacco: Not on file  . Alcohol Use: No  . Drug Use: No     Comment: uses E-cigs  without the nicotine  . Sexual Activity: Not on file   Other Topics Concern  . Not on file   Social History Narrative    Allergies  Allergen Reactions  . Amoxicillin Other (See Comments)    Made her very hot and her lips turned blue. She also had frequent urination.  . Erythromycin     Was told not to take it in the hospital  . Penicillins Other (See Comments)    She can take any cillins    Correction  Pt can  Not take  cillens per pt  . Latex Rash     Outpatient Prescriptions Prior to Visit  Medication Sig Dispense Refill  . cefUROXime (CEFTIN) 500 MG tablet Take 1 tablet (500 mg total) by mouth 2 (two) times daily with a meal. 20 tablet 0  . docusate sodium (COLACE) 100 MG capsule Take 1 capsule (100 mg total) by mouth 2 (two) times daily as needed for mild constipation. (Patient not taking: Reported on 07/21/2015) 30 capsule 2  . fluconazole (DIFLUCAN) 150 MG tablet Take 1 tablet (150 mg total) by mouth once. 1 tablet 1  . fluticasone (FLONASE) 50 MCG/ACT nasal spray Place 1 spray into both nostrils daily.    Marland Kitchen ibuprofen (ADVIL,MOTRIN) 600 MG tablet Take 1 tablet (600 mg total) by mouth every 6 (six) hours as needed for moderate pain. 30 tablet 2  . oxyCODONE-acetaminophen (PERCOCET/ROXICET) 5-325 MG per tablet Take  1-2 tablets by mouth every 4 (four) hours as needed. (Patient taking differently: Take 1-2 tablets by mouth every 4 (four) hours as needed for moderate pain. ) 20 tablet 0  . oxyCODONE-acetaminophen (PERCOCET/ROXICET) 5-325 MG per tablet Take 1-2 tablets by mouth every 4 (four) hours as needed for severe pain (moderate to severe pain (when tolerating fluids)). 30 tablet 0   No facility-administered medications prior to visit.    ROS Review of Systems  Constitutional: Negative for activity change and appetite change.  HENT: Negative for sinus pressure and sore throat.   Respiratory: Negative for chest tightness, shortness of breath and wheezing.   Gastrointestinal:  Positive for abdominal pain (right lower quadrant). Negative for constipation and abdominal distention.  Genitourinary: Positive for vaginal discharge (thick, curdy).       Vaginal itching.  Musculoskeletal: Negative.   Psychiatric/Behavioral: Negative for behavioral problems and dysphoric mood.    Objective: There were no vitals filed for this visit.   LMP 06/29/2015  BP/Weight 07/21/2015 9/83/3825 0/03/3975  Systolic BP 734 193 -  Diastolic BP 73 62 -  Wt. (Lbs) 231 - 226.41  BMI 36.17 - 35.45    Lab Results  Component Value Date   WBC 12.0* 07/14/2015   HGB 12.0 07/14/2015   HCT 35.7* 07/14/2015   PLT 208 07/14/2015   GLUCOSE 96 07/14/2015   ALT 25 07/14/2015   AST 20 07/14/2015   NA 137 07/14/2015   K 3.8 07/14/2015   CL 106 07/14/2015   CREATININE 0.74 07/16/2015   BUN 6 07/14/2015   CO2 24 07/14/2015   TSH 2.336 03/23/2013    Physical Exam  Constitutional: She is oriented to person, place, and time. She appears well-developed and well-nourished.  Obese  Cardiovascular: Normal rate, normal heart sounds and intact distal pulses.   No murmur heard. Pulmonary/Chest: Effort normal and breath sounds normal. She has no wheezes. She has no rales. She exhibits no tenderness.  Abdominal: Soft. Bowel sounds are normal. She exhibits no distension and no mass. There is tenderness (Mild right lower quadrant edema and tenderness).  Carvity of umbilical abscess with granulation tissue evident.  Musculoskeletal: Normal range of motion.  Neurological: She is alert and oriented to person, place, and time.  Psychiatric: She has a normal mood and affect.     Assessment & Plan:   1. Vaginal candidiasis Patient advised to take Diflucan pill. Meanwhile placed on clotrimazole - Urinalysis Dipstick  2. Abscess, umbilical Abscess cavity packed with wet-to-dry dressing. Continue course of Ceftin Advised to keep appointment with GYN which comes up in 5 days.  3. Abdominal pain,  chronic, right lower quadrant Likely referred pain from previous surgery. Advised to discuss this with her GYN.     Follow-up: Return in about 2 weeks (around 08/05/2015), or if symptoms worsen or fail to improve, for Follow-up of umbilical abscess. Dr. Jarold Song.   Arnoldo Morale MD

## 2015-07-25 ENCOUNTER — Telehealth: Payer: Self-pay | Admitting: General Practice

## 2015-07-25 NOTE — Telephone Encounter (Signed)
Patient called and left message stating she would like a refill on her oxycodone. Per chart review, patient had surgery on 9/8 which lead to open wound abscess. Patient currently has open wound being packed.

## 2015-07-26 NOTE — Telephone Encounter (Signed)
Called patient stating I am returning your phone call. Asked patient if she was taking anything other than oxycodone. Patient states she takes ibuprofen 600mg  every 6hours and takes the percocet when her wound gets packed or when her dressing is changed which is twice a day. Discussed with patient that I see she has a follow up appt with Dr Elly Modena tomorrow. Recommend she discuss her pain at her visit tomorrow and request refill then. Patient verbalized understanding and had no questions

## 2015-07-27 ENCOUNTER — Encounter: Payer: Self-pay | Admitting: Obstetrics and Gynecology

## 2015-07-27 ENCOUNTER — Ambulatory Visit (INDEPENDENT_AMBULATORY_CARE_PROVIDER_SITE_OTHER): Payer: Self-pay | Admitting: Obstetrics and Gynecology

## 2015-07-27 VITALS — BP 114/74 | HR 70 | Temp 98.1°F | Ht 67.0 in | Wt 219.4 lb

## 2015-07-27 DIAGNOSIS — Z9889 Other specified postprocedural states: Secondary | ICD-10-CM

## 2015-07-27 MED ORDER — OXYCODONE-ACETAMINOPHEN 5-325 MG PO TABS: 1.0000 | ORAL_TABLET | ORAL | Status: DC | PRN

## 2015-07-27 NOTE — Progress Notes (Signed)
Patient ID: Sara Booth, female   DOB: 1976-05-28, 39 y.o.   MRN: [id number] 39 yo here for post op check. Patient underwent a laparoscopic left oophorectomy secondary to the presence of a dermoid cyst. Post op course complicated by abdominal wall abscess which requires intra-operative debridement. Patient reports feeling well without fevers, chills or abnormal drainage  Past Medical History  Diagnosis Date  . Asthma   . Anxiety    Past Surgical History  Procedure Laterality Date  . Cholecystectomy    . Tubal ligation    . Laparoscopic unilateral salpingo oopherectomy Left 06/28/2015    Procedure: LAPAROSCOPIC LEFT SALPINGO OOPHORECTOMY;  Surgeon: Mora Bellman, MD;  Location: [facility];  Service: Gynecology;  Laterality: Left;  . Incision and drainage abscess N/A 07/14/2015    Procedure: INCISION AND DRAINAGE UMBILICAL ABSCESS;  Surgeon: Guss Bunde, MD;  Location: [facility];  Service: Gynecology;  Laterality: N/A;   Family History  Problem Relation Age of Onset  . Cancer Maternal Grandmother     breast   Social History  Substance Use Topics  . Smoking status: Former Smoker -- 0.00 packs/day    Types: Cigarettes  . Smokeless tobacco: None  . Alcohol Use: No   Blood pressure 114/74, pulse 70, temperature 98.1 F (36.7 C), temperature source Oral, height 5\' 7"  (1.702 m), weight 219 lb 6.4 oz (99.519 kg), last menstrual period 06/29/2015. GENERAL: Well-developed, well-nourished female in no acute distress.  ABDOMEN: Soft, nontender, nondistended. No organomegaly. Incision: healing well, good granulation tissue, measuring approximately 3 cm by 2 cm deep EXTREMITIES: No cyanosis, clubbing, or edema, 2+ distal pulses.  A/P 39 yo here for post op check complicated by an abdominal wall abscess - pathology results reviewed with the patient - Incision healing well. Continue home health care with dressing change  -RTC in 2 weeks

## 2015-07-29 ENCOUNTER — Ambulatory Visit (INDEPENDENT_AMBULATORY_CARE_PROVIDER_SITE_OTHER): Payer: Self-pay | Admitting: Obstetrics & Gynecology

## 2015-07-29 ENCOUNTER — Encounter: Payer: Self-pay | Admitting: Obstetrics & Gynecology

## 2015-07-29 ENCOUNTER — Telehealth: Payer: Self-pay | Admitting: *Deleted

## 2015-07-29 VITALS — BP 133/61 | HR 70 | Wt 221.0 lb

## 2015-07-29 DIAGNOSIS — IMO0001 Reserved for inherently not codable concepts without codable children: Secondary | ICD-10-CM

## 2015-07-29 DIAGNOSIS — T814XXD Infection following a procedure, subsequent encounter: Secondary | ICD-10-CM

## 2015-07-29 NOTE — Telephone Encounter (Signed)
Debbie , [facility] Nurse called and left a message that she is calling about Sara Booth's incision. She states she noted an area of hardening around her incision on Tuesday, states today area has gotten bigger and is painful.  Wanted to let us know , may call nurse or call patient.   Called Sara Booth and she states she saw Dr. Elly Modena on Wednesday, but it has gotten bigger since then and painful. Offered her an 11am appointment in our office today and she agreed to appointment.   Called Debbie and let her know plan.

## 2015-07-29 NOTE — Patient Instructions (Signed)
Wound Infection °A wound infection happens when a type of germ (bacteria) starts growing in the wound. In some cases, this can cause the wound to break open. If cared for properly, the infected wound will heal from the inside to the outside. Wound infections need treatment. °CAUSES °An infection is caused by bacteria growing in the wound.  °SYMPTOMS  °· Increase in redness, swelling, or pain at the wound site. °· Increase in drainage at the wound site. °· Wound or bandage (dressing) starts to smell bad. °· Fever. °· Feeling tired or fatigued. °· Pus draining from the wound. °TREATMENT  °Your health care provider will prescribe antibiotic medicine. The wound infection should improve within 24 to 48 hours. Any redness around the wound should stop spreading and the wound should be less painful.  °HOME CARE INSTRUCTIONS  °· Only take over-the-counter or prescription medicines for pain, discomfort, or fever as directed by your health care provider. °· Take your antibiotics as directed. Finish them even if you start to feel better. °· Gently wash the area with mild soap and water 2 times a day, or as directed. Rinse off the soap. Pat the area dry with a clean towel. Do not rub the wound. This may cause bleeding. °· Follow your health care provider's instructions for how often you need to change the dressing. °· Apply ointment and a dressing to the wound as directed. °· If the dressing sticks, moisten it with soapy water and gently remove it. °· Change the bandage right away if it becomes wet, dirty, or develops a bad smell. °· Take showers. Do not take tub baths, swim, or do anything that may soak the wound until it is healed. °· Avoid exercises that make you sweat heavily. °· Use anti-itch medicine as directed by your health care provider. The wound may itch when it is healing. Do not pick or scratch at the wound. °· Follow up with your health care provider to get your wound rechecked as directed. °SEEK MEDICAL CARE  IF: °· You have an increase in swelling, pain, or redness around the wound. °· You have an increase in the amount of pus coming from the wound. °· There is a bad smell coming from the wound. °· More of the wound breaks open. °· You have a fever. °MAKE SURE YOU:  °· Understand these instructions. °· Will watch your condition. °· Will get help right away if you are not doing well or get worse. °Document Released: 07/21/2003 Document Revised: 10/27/2013 Document Reviewed: 02/25/2011 °ExitCare® Patient Information ©2015 [employer]. This information is not intended to replace advice given to you by your health care provider. Make sure you discuss any questions you have with your health care provider. ° °

## 2015-07-29 NOTE — Progress Notes (Addendum)
Patient ID: Sara Booth, female   DOB: 1976/02/17, 39 y.o.   MRN: [id number] History:  39 y.o. [id number] here today for eval of 'hard area near wound.'   Pt reports that this is not new but seems to be larger. Her nurse called today because she was concerned.  Pt denies fever or chills but, occ feels flushed.  She does not have a working thermometer at home.  She denies drainage from the incision.    She is s/p lararopscoy with subsequent I&D of umbilical port site.  The following portions of the patient's history were reviewed and updated as appropriate: allergies, current medications, past family history, past medical history, past social history, past surgical history and problem list.  Review of Systems:  Pertinent items are noted in HPI.  Objective:  Physical Exam Blood pressure 133/61, pulse 70, weight 221 lb (100.245 kg), last menstrual period 06/29/2015. Gen: NAD Abd: Soft, nontender and nondistended.  The wound at the umbilicus is clean and dry with wet packing guaze in place.  The guaze was moved to note a clean wound bed.  No erythema around the lesion. There is some tenderness but, no fluctuance or warmth noted.   Labs and Imaging Ct Abdomen Pelvis W Contrast  07/14/2015   CLINICAL DATA:  Knot at the RIGHT-side of the umbilicus with increased redness and swelling common noted at end of August, had laparoscopic LEFT ovarian cystectomy on 06/28/2015, has been treated by antibiotics and pain medication, brown drainage from umbilicus  EXAM: CT ABDOMEN AND PELVIS WITH CONTRAST  TECHNIQUE: Multidetector CT imaging of the abdomen and pelvis was performed using the standard protocol following bolus administration of intravenous contrast. Sagittal and coronal MPR images reconstructed from axial data set.  CONTRAST:  129mL OMNIPAQUE IOHEXOL 300 MG/ML SOLN IV. Dilute oral contrast.  COMPARISON:  04/21/2015  FINDINGS: Minimal dependent atelectasis at lung bases.  Gallbladder surgically absent.  Spleen  appears enlarged, 15.5 x 6.1 x 14.4 cm.  Liver, spleen, pancreas, kidneys, and adrenal glands otherwise normal appearance.  Incidentally noted retro aortic LEFT renal vein.  Normal appendix.  Large subcutaneous fluid collection containing a small focus of gas identified in the anterior RIGHT abdominal wall extending to the RIGHT lateral margin of the umbilicus, collection measuring 9.7 x 3.3 x 5.2 cm in size consistent with abscess.  Collection appears external to the anterior abdominal wall fascia without intraperitoneal extension.  Associated skin thickening at umbilicus and diffuse surrounding subcutaneous infiltrative changes.  Subcutaneous edema also identified particularly at the RIGHT flank.  No hernias identified.  Stomach and bowel loops normal appearance.  No intra-abdominal mass, adenopathy, free air or free fluid.  Unremarkable bladder, ureters, uterus and adnexa.  Osseous structures unremarkable.  IMPRESSION: Large subcutaneous abscessed lateral and inferior to the umbilicus, 9.7 x 3.3 x 5.2 cm with surrounding subcutaneous infiltrative changes and overlying skin thickening.  No intra-abdominal extension of inflammatory process identified.  Mild splenomegaly.   Electronically Signed   By: Lavonia Dana M.D.   On: 07/14/2015 17:11    Assessment & Plan:  1. Wound infection after surgery, subsequent encounter- wound healing well Keep incision clean and dry.   Continue current wound care regimen F/u with Dr. Elly Modena in 2 week Pt encouraged to get a thermometer and f/u sooner prn  Fever, chills, warmth, drainage or fluctuance of the wound  Carolyn L. Harraway-Smith, M.D., Cherlynn June

## 2015-08-03 ENCOUNTER — Encounter: Payer: Self-pay | Admitting: *Deleted

## 2015-08-03 ENCOUNTER — Encounter: Payer: Self-pay | Admitting: Family Medicine

## 2015-08-03 ENCOUNTER — Ambulatory Visit: Payer: Medicaid Other | Attending: Family Medicine | Admitting: Family Medicine

## 2015-08-03 VITALS — BP 114/76 | HR 96 | Temp 98.2°F | Ht 67.0 in | Wt 224.0 lb

## 2015-08-03 DIAGNOSIS — R1031 Right lower quadrant pain: Secondary | ICD-10-CM | POA: Insufficient documentation

## 2015-08-03 DIAGNOSIS — G8929 Other chronic pain: Secondary | ICD-10-CM | POA: Insufficient documentation

## 2015-08-03 DIAGNOSIS — L02216 Cutaneous abscess of umbilicus: Secondary | ICD-10-CM | POA: Insufficient documentation

## 2015-08-03 NOTE — Patient Instructions (Signed)

## 2015-08-03 NOTE — Progress Notes (Signed)
Patient reports soreness around her surgical site She went to [facility] clinic yesterday and says she was told two different things buy two different MD's regarding whether or not her left fallopian tube was removed or not-she would like clarification She is asking for Rx for pain medication today She reports pain 5/10 sore States her yeast infection has resolved She is packing her wound and changing her dressing bid

## 2015-08-03 NOTE — Progress Notes (Signed)
Subjective:    Patient ID: Sara Booth, female    DOB: February 25, 1976, 39 y.o.   MRN: [id number]  HPI  Sara Booth is a 39 year old female status post left laparoscopic salpingo-oophorectomy on 06/28/15 for ovarian lesions containing fat consistent with dermoid cyst who subsequently developed an umbilical abscess and is status post incision and drainage of the abscess on 07/14/15 and was subsequently discharged on 07/18/15  She has completed a course of Ceftin and has had advanced home care nurse attending to her two times a week and her husband performs daily dressing changes. She had a visit with her GYN on 07/28/15 and received a prescription for oxycodone. She continues to complain of right lower quadrant pain but denies constipation or diarrhea or fever.  She informs me that she needs clarification as to whether her fallopian tubes were removed along with her ovaries and I have advised her to discuss this with her GYN who performed her procedure. . Past Medical History  Diagnosis Date  . Asthma   . Anxiety     Past Surgical History  Procedure Laterality Date  . Cholecystectomy    . Tubal ligation    . Laparoscopic unilateral salpingo oopherectomy Left 06/28/2015    Procedure: LAPAROSCOPIC LEFT SALPINGO OOPHORECTOMY;  Surgeon: Mora Bellman, MD;  Location: [facility];  Service: Gynecology;  Laterality: Left;  . Incision and drainage abscess N/A 07/14/2015    Procedure: INCISION AND DRAINAGE UMBILICAL ABSCESS;  Surgeon: Guss Bunde, MD;  Location: [facility];  Service: Gynecology;  Laterality: N/A;   Social History   Social History  . Marital Status: Married    Spouse Name: N/A  . Number of Children: N/A  . Years of Education: N/A   Occupational History  . Not on file.   Social History Main Topics  . Smoking status: Former Smoker -- 0.00 packs/day    Types: Cigarettes    Quit date: 04/02/2015  . Smokeless tobacco: Not on file  . Alcohol Use: No  . Drug Use: No     Comment: uses  E-cigs without the nicotine  . Sexual Activity: Not on file   Other Topics Concern  . Not on file   Social History Narrative    Allergies  Allergen Reactions  . Amoxicillin Other (See Comments)    Made her very hot and her lips turned blue. She also had frequent urination.  . Erythromycin     Was told not to take it in the hospital  . Penicillins Other (See Comments)    She can take any cillins    Correction  Pt can  Not take  cillens per pt  . Latex Rash    Current Outpatient Prescriptions on File Prior to Visit  Medication Sig Dispense Refill  . fluticasone (FLONASE) 50 MCG/ACT nasal spray Place 1 spray into both nostrils daily.    Marland Kitchen ibuprofen (ADVIL,MOTRIN) 600 MG tablet Take 1 tablet (600 mg total) by mouth every 6 (six) hours as needed for moderate pain. 30 tablet 2  . oxyCODONE-acetaminophen (PERCOCET/ROXICET) 5-325 MG per tablet Take 1-2 tablets by mouth every 4 (four) hours as needed for severe pain (moderate to severe pain (when tolerating fluids)). 30 tablet 0  . cefUROXime (CEFTIN) 500 MG tablet Take 1 tablet (500 mg total) by mouth 2 (two) times daily with a meal. (Patient not taking: Reported on 08/03/2015) 20 tablet 0  . clotrimazole (LOTRIMIN) 1 % cream Apply 1 application topically 2 (two) times daily. (Patient not  taking: Reported on 07/27/2015) 30 g 0  . docusate sodium (COLACE) 100 MG capsule Take 1 capsule (100 mg total) by mouth 2 (two) times daily as needed for mild constipation. (Patient not taking: Reported on 07/21/2015) 30 capsule 2  . fluconazole (DIFLUCAN) 150 MG tablet Take 1 tablet (150 mg total) by mouth once. (Patient not taking: Reported on 07/27/2015) 1 tablet 1  . oxyCODONE-acetaminophen (PERCOCET/ROXICET) 5-325 MG per tablet Take 1-2 tablets by mouth every 4 (four) hours as needed. (Patient not taking: Reported on 07/27/2015) 20 tablet 0   No current facility-administered medications on file prior to visit.       Review of Systems Review of Systems   Constitutional: Negative for activity change and appetite change.  HENT: Negative for sinus pressure and sore throat.   Respiratory: Negative for chest tightness, shortness of breath and wheezing.   Gastrointestinal: Positive for abdominal pain (right lower quadrant). Negative for constipation and abdominal distention.  Genitourinary: negative  Musculoskeletal: Negative.   Psychiatric/Behavioral: Negative for behavioral problems and dysphoric mood.       Objective: Filed Vitals:   08/03/15 1207  BP: 114/76  Pulse: 96  Temp: 98.2 F (36.8 C)  Height: 5\' 7"  (1.702 m)  Weight: 224 lb (101.606 kg)  SpO2: 96%      Physical Exam Constitutional: She is oriented to person, place, and time. She appears well-developed and well-nourished.  Obese  Cardiovascular: Normal rate, normal heart sounds and intact distal pulses.   No murmur heard. Pulmonary/Chest: Effort normal and breath sounds normal. She has no wheezes. She has no rales. She exhibits no tenderness.  Abdominal: Soft. Bowel sounds are normal. She exhibits no distension and no mass. There is tenderness (Mild right lower quadrant edema and tenderness which is not as pronounced compared to her last office visit.).  Carvity of umbilical abscess with granulation tissue evident decreased in size.  Musculoskeletal: Normal range of motion.  Neurological: She is alert and oriented to person, place, and time.  Psychiatric: She has a normal mood and affect.         Assessment & Plan:  1. Abscess, umbilical Abscess cavity packed with wet-to-dry dressing. Completed course of Ceftin Advised to keep appointment with GYN which comes up on 08/12/15  2.Abdominal pain, chronic, right lower quadrant Likely referred pain from previous surgery. She remains on oxycodone (received rx 6 days ago) which she is requesting a refill of because she will be running out a few days before her appointment with GYN and I have encouraged her to call her GYN  for a refill when she is about running out.  This note has been created with Surveyor, quantity. Any transcriptional errors are unintentional.

## 2015-08-10 ENCOUNTER — Telehealth: Payer: Self-pay | Admitting: *Deleted

## 2015-08-10 NOTE — Telephone Encounter (Signed)
Pt contacted the clinic requesting refill on Oxycodone medication.   Contacted patient, patient request refill on Oxycodone, states she only takes it when she has a dressing change.  Pt is having dressing change 2x daily. Pt states she has one oxycodone left.  Contacted Dr. Elly Modena to discuss, Dr. Elly Modena states patient should transition to Motrin.    Contacted patient, informed of Dr. Elly Modena recommendation, pt states she needs something stronger than Motrin.  Encourage patient to take Motrin and Dr. Elly Modena will evaluate on Friday 10/7.  Pt verbalizes understanding.

## 2015-08-12 ENCOUNTER — Encounter: Payer: Self-pay | Admitting: Obstetrics and Gynecology

## 2015-08-12 ENCOUNTER — Ambulatory Visit (INDEPENDENT_AMBULATORY_CARE_PROVIDER_SITE_OTHER): Payer: Self-pay | Admitting: Obstetrics and Gynecology

## 2015-08-12 VITALS — BP 130/75 | HR 73 | Temp 98.5°F | Ht 68.0 in | Wt 225.8 lb

## 2015-08-12 DIAGNOSIS — T814XXD Infection following a procedure, subsequent encounter: Secondary | ICD-10-CM

## 2015-08-12 DIAGNOSIS — IMO0001 Reserved for inherently not codable concepts without codable children: Secondary | ICD-10-CM

## 2015-08-12 MED ORDER — TRAMADOL HCL 50 MG PO TABS: 50.0000 mg | ORAL_TABLET | Freq: Four times a day (QID) | ORAL | Status: DC | PRN

## 2015-08-12 NOTE — Progress Notes (Addendum)
Patient ID: Sara Booth, female   DOB: 06-13-1976, 39 y.o.   MRN: [id number] History:  39 y.o. [id number] here today for follow up on wound infection   Pt denies fever, chills or drainage from her incision. She is s/p lararopscoy with subsequent I&D of umbilical port site. She states that she continues with twice daily wet to dry dressing changes but home health nurse has stopped coming by.  The following portions of the patient's history were reviewed and updated as appropriate: allergies, current medications, past family history, past medical history, past social history, past surgical history and problem list.  Review of Systems:  Pertinent items are noted in HPI.  Objective:  Physical Exam Blood pressure 130/75, pulse 73, temperature 98.5 F (36.9 C), temperature source Oral, height 5\' 8"  (1.727 m), weight 225 lb 12.8 oz (102.422 kg), last menstrual period 07/27/2015. Gen: NAD Abd: Soft, nontender and nondistended.  The wound at the umbilicus is clean and dry with wet packing guaze in place.  The guaze was moved to note a clean wound bed.  No erythema around the lesion. There is wound in 1 cm deep but 2.5 cm wide  Labs and Imaging Ct Abdomen Pelvis W Contrast  07/14/2015   CLINICAL DATA:  Knot at the RIGHT-side of the umbilicus with increased redness and swelling common noted at end of August, had laparoscopic LEFT ovarian cystectomy on 06/28/2015, has been treated by antibiotics and pain medication, brown drainage from umbilicus  EXAM: CT ABDOMEN AND PELVIS WITH CONTRAST  TECHNIQUE: Multidetector CT imaging of the abdomen and pelvis was performed using the standard protocol following bolus administration of intravenous contrast. Sagittal and coronal MPR images reconstructed from axial data set.  CONTRAST:  147mL OMNIPAQUE IOHEXOL 300 MG/ML SOLN IV. Dilute oral contrast.  COMPARISON:  04/21/2015  FINDINGS: Minimal dependent atelectasis at lung bases.  Gallbladder surgically absent.  Spleen appears  enlarged, 15.5 x 6.1 x 14.4 cm.  Liver, spleen, pancreas, kidneys, and adrenal glands otherwise normal appearance.  Incidentally noted retro aortic LEFT renal vein.  Normal appendix.  Large subcutaneous fluid collection containing a small focus of gas identified in the anterior RIGHT abdominal wall extending to the RIGHT lateral margin of the umbilicus, collection measuring 9.7 x 3.3 x 5.2 cm in size consistent with abscess.  Collection appears external to the anterior abdominal wall fascia without intraperitoneal extension.  Associated skin thickening at umbilicus and diffuse surrounding subcutaneous infiltrative changes.  Subcutaneous edema also identified particularly at the RIGHT flank.  No hernias identified.  Stomach and bowel loops normal appearance.  No intra-abdominal mass, adenopathy, free air or free fluid.  Unremarkable bladder, ureters, uterus and adnexa.  Osseous structures unremarkable.  IMPRESSION: Large subcutaneous abscessed lateral and inferior to the umbilicus, 9.7 x 3.3 x 5.2 cm with surrounding subcutaneous infiltrative changes and overlying skin thickening.  No intra-abdominal extension of inflammatory process identified.  Mild splenomegaly.   Electronically Signed   By: Lavonia Dana M.D.   On: 07/14/2015 17:11    Assessment & Plan:  1. Wound infection after surgery, subsequent encounter- wound healing well Keep incision clean and dry.   Continue current wound care regimen Will contact home health nurse agency to coordinate home care Patient requested pain medication other than ibuprofen for dressing changes. Rx tramadol provided Follow up in 2 weeks

## 2015-08-12 NOTE — Progress Notes (Signed)
Case Manager received call from [facility] about why the [facility] stopped seeing the pt for dressing changes.  CM will follow up.  CM called Kristen w/ [facility] and once the care giver is trained on the dressing changes and things are going well they are no longer able to bill the insurance.  I called and spoke w/ the pt and explained this to her and she voiced understanding.  Encouraged pt to call [facility] or her MD if she had any problems or issues and she voiced understanding.   [place],  [place]

## 2015-08-29 ENCOUNTER — Encounter: Payer: Self-pay | Admitting: Obstetrics and Gynecology

## 2015-08-29 ENCOUNTER — Ambulatory Visit: Payer: Self-pay | Admitting: Obstetrics and Gynecology

## 2015-08-29 VITALS — BP 124/60 | HR 65 | Temp 98.5°F | Ht 67.0 in | Wt 227.3 lb

## 2015-08-29 DIAGNOSIS — T8140XA Infection following a procedure, unspecified, initial encounter: Secondary | ICD-10-CM

## 2015-08-29 NOTE — Progress Notes (Signed)
Patient ID: Sara Booth, female   DOB: Mar 17, 1976, 39 y.o.   MRN: [id number] History:  39 y.o. [id number] here today for follow up on wound infection. She reports feeling very well and the incision is also healing well  The following portions of the patient's history were reviewed and updated as appropriate: allergies, current medications, past family history, past medical history, past social history, past surgical history and problem list.  Review of Systems:  Pertinent items are noted in HPI.  Objective:  Physical Exam Blood pressure 124/60, pulse 65, temperature 98.5 F (36.9 C), height 5\' 7"  (1.702 m), weight 227 lb 4.8 oz (103.103 kg), last menstrual period 07/27/2015. Gen: NAD Abd: Soft, nontender and nondistended.  The wound at the umbilicus is clean and dry with wet packing guaze in place.  The guaze was moved to note a clean wound bed with granulation tissue.  No erythema around the lesion. There is wound is less than 1 cm deep but 2.5 cm wide  Labs and Imaging No results found.  Assessment & Plan:  1. Wound infection after surgery, subsequent encounter- wound healing well Keep incision clean and dry.   Continue current wound care regimen Follow up in 2 weeks

## 2015-08-30 ENCOUNTER — Telehealth: Payer: Self-pay | Admitting: *Deleted

## 2015-08-30 ENCOUNTER — Telehealth: Payer: Self-pay | Admitting: Family Medicine

## 2015-08-30 NOTE — Telephone Encounter (Signed)
Received message left on nurse line on 08/30/15 at 1653.  Patient states she needs additional gauze and saline to clean her wound.  Wants to know if we can help her get some.  Requests a return call.

## 2015-08-30 NOTE — Telephone Encounter (Signed)
Pt. Called stating that the nurse was going to help her get more supplies for her wound and pt. Has not been able to get in touch with her. Please f/u with pt. For more information.

## 2015-08-31 NOTE — Telephone Encounter (Signed)
Spoke with patient regarding getting supplies for her open wound. I advise her that any medical drug store would have the guaze she needs and saline. Pt stated she would check at her local pharmacy and [facility] to located some guaze.

## 2015-08-31 NOTE — Discharge Summary (Signed)
Physician Discharge Summary  Patient ID: Sara Booth MRN: [id number] DOB/AGE: August 15, 1976 39 y.o.  Admit date: 07/14/2015 Discharge date: 08/31/2015  Admission Diagnoses: Postoperative abdominal wall abscess  Discharge Diagnoses:  Principal Problem:   Post op infection Active Problems:   Subcutaneous abscess   Discharged Condition: stable  Hospital Course: Patient was admitted about a week after having had a laparoscopic removal of a dermoid cyst by Dr. Elly Modena.  She returned to the ER and was seen 2 days prior to admission with increased abdominal wall pain and felt she might have an early cellulitis.  She was placed on Bactrim.  She then returned on the evening of admission with a clear abdominal wall abscess and underwent an exploratory procedure for evacuation and packing.  She is maintained on IV antibiotics and daily wound evaluation and dressing changes.  It has improved dramatically since admission and home health was arranged to see her daily for evaluation of ongoing dressing changes.  She will also be maintained on oral antibiotics to complete a 10 day course that she is concerned here in the hospital  Consults: general surgery  Significant Diagnostic Studies:   Treatments: antibiotics:  and surgery: as above  Discharge Exam: Blood pressure 119/62, pulse 59, temperature 98.2 F (36.8 C), temperature source Oral, resp. rate 18, height 5\' 7"  (1.702 m), weight 226 lb 6.6 oz (102.7 kg), last menstrual period 06/29/2015, SpO2 100 %. General appearance: alert, cooperative and no distress GI: soft, non-tender; bowel sounds normal; no masses,  no organomegaly Incision/Wound:healing well, to close secondarily  Disposition: 01-Home or Self Care  Discharge Instructions    Call MD for:  persistant nausea and vomiting    Complete by:  As directed      Call MD for:  severe uncontrolled pain    Complete by:  As directed      Call MD for:  temperature >100.4    Complete by:  As  directed      Diet - low sodium heart healthy    Complete by:  As directed      Discharge wound care:    Complete by:  As directed   Change dressing twice daily     Driving Restrictions    Complete by:  As directed   No restrictions     Increase activity slowly    Complete by:  As directed      Lifting restrictions    Complete by:  As directed   No more than 10 pounds     Sexual Activity Restrictions    Complete by:  As directed   None for 2 weeks            Medication List    STOP taking these medications        docusate sodium 100 MG capsule  Commonly known as:  COLACE     oxyCODONE-acetaminophen 5-325 MG tablet  Commonly known as:  PERCOCET/ROXICET     sulfamethoxazole-trimethoprim 800-160 MG tablet  Commonly known as:  BACTRIM DS      TAKE these medications        fluticasone 50 MCG/ACT nasal spray  Commonly known as:  FLONASE  Place 1 spray into both nostrils daily.     ibuprofen 600 MG tablet  Commonly known as:  ADVIL,MOTRIN  Take 1 tablet (600 mg total) by mouth every 6 (six) hours as needed for moderate pain.           Follow-up Information  Follow up with [facility]     On 07/22/2015.   Why:  3:45 pm for hospital follow up   Contact information:   [address] [place] [address] [phone]      Follow up with [facility].   Why:  HHRN for wound care   Contact information:   [address] [place] [address] [phone]       Follow up with CONSTANT,PEGGY, MD On 07/21/2015.   Specialty:  Obstetrics and Gynecology   Why:  post op visit, For wound re-check   Contact information:   [place] [address] [phone]       Signed: Florian Buff 08/31/2015, 9:52 PM

## 2015-09-06 ENCOUNTER — Ambulatory Visit: Payer: MEDICAID | Attending: Family Medicine | Admitting: Family Medicine

## 2015-09-06 ENCOUNTER — Encounter: Payer: Self-pay | Admitting: Family Medicine

## 2015-09-06 VITALS — BP 130/84 | HR 58 | Temp 98.3°F | Resp 16 | Ht 67.0 in | Wt 229.0 lb

## 2015-09-06 DIAGNOSIS — L02216 Cutaneous abscess of umbilicus: Secondary | ICD-10-CM | POA: Insufficient documentation

## 2015-09-06 DIAGNOSIS — L719 Rosacea, unspecified: Secondary | ICD-10-CM | POA: Insufficient documentation

## 2015-09-06 MED ORDER — METRONIDAZOLE 1 % EX GEL: Freq: Every day | CUTANEOUS | Status: DC

## 2015-09-06 NOTE — Progress Notes (Signed)
Subjective:    Patient ID: Sara Booth, female    DOB: [birth year], 39 y.o.   MRN: [id number]  HPI Sara Booth is a 39 year old female status post left laparoscopic salpingo-oophorectomy on 06/2015 for ovarian lesions containing fat consistent with dermoid cyst who subsequently developed an umbilical abscess and is status post incision and drainage of the abscess on 07/14/15 and has completed a course of antibiotics and has been to see her GYN at the [facility] for follow-up visits.  She reports the umbilical wound is healing well and she has been dressing it with dry gauze. She complains of erythema of her face from rosacea and would like to be treated. Denies itching and admits to noticing some bumps on her face   Past Medical History  Diagnosis Date  . Asthma   . Anxiety     Past Surgical History  Procedure Laterality Date  . Cholecystectomy    . Tubal ligation    . Laparoscopic unilateral salpingo oopherectomy Left 06/28/2015    Procedure: LAPAROSCOPIC LEFT SALPINGO OOPHORECTOMY;  Surgeon: Mora Bellman, MD;  Location: [facility];  Service: Gynecology;  Laterality: Left;  . Incision and drainage abscess N/A 07/14/2015    Procedure: INCISION AND DRAINAGE UMBILICAL ABSCESS;  Surgeon: Guss Bunde, MD;  Location: [facility];  Service: Gynecology;  Laterality: N/A;    Social History   Social History  . Marital Status: Married    Spouse Name: N/A  . Number of Children: N/A  . Years of Education: N/A   Occupational History  . Not on file.   Social History Main Topics  . Smoking status: Former Smoker -- 0.00 packs/day    Types: Cigarettes    Quit date: 04/02/2015  . Smokeless tobacco: Never Used  . Alcohol Use: No  . Drug Use: No     Comment: uses E-cigs without the nicotine  . Sexual Activity: Not Currently   Other Topics Concern  . Not on file   Social History Narrative    Allergies  Allergen Reactions  . Amoxicillin Other (See Comments)    Made her very hot and  her lips turned blue. She also had frequent urination.  . Erythromycin     Was told not to take it in the hospital  . Penicillins Other (See Comments)    She can take any cillins    Correction  Pt can  Not take  cillens per pt  . Latex Rash    Current Outpatient Prescriptions on File Prior to Visit  Medication Sig Dispense Refill  . acetaminophen (TYLENOL) 500 MG tablet Take 500 mg by mouth every 6 (six) hours as needed.    . fluticasone (FLONASE) 50 MCG/ACT nasal spray Place 1 spray into both nostrils daily.    Marland Kitchen ibuprofen (ADVIL,MOTRIN) 600 MG tablet Take 1 tablet (600 mg total) by mouth every 6 (six) hours as needed for moderate pain. 30 tablet 2  . traMADol (ULTRAM) 50 MG tablet Take 1 tablet (50 mg total) by mouth every 6 (six) hours as needed. (Patient not taking: Reported on 08/29/2015) 60 tablet 0   No current facility-administered medications on file prior to visit.      Review of Systems  Constitutional: Negative for activity change and appetite change.  HENT: Negative for sinus pressure and sore throat.   Respiratory: Negative for chest tightness, shortness of breath and wheezing.   Gastrointestinal: Negative for abdominal pain, constipation and abdominal distention.  Genitourinary: Negative.   Musculoskeletal: Negative.  Skin:       See history of present illness.  Psychiatric/Behavioral: Negative for behavioral problems and dysphoric mood.       Objective: Filed Vitals:   09/06/15 1133  BP: 130/84  Pulse: 58  Temp: 98.3 F (36.8 C)  TempSrc: Oral  Resp: 16  Height: 5\' 7"  (1.702 m)  Weight: 229 lb (103.874 kg)  SpO2: 97%      Physical Exam  Constitutional: She is oriented to person, place, and time. She appears well-developed and well-nourished.  Cardiovascular: Normal heart sounds and intact distal pulses.  Bradycardia present.   No murmur heard. Pulmonary/Chest: Effort normal and breath sounds normal. She has no wheezes. She has no rales. She exhibits  no tenderness.  Abdominal: Soft. Bowel sounds are normal. She exhibits no distension and no mass. There is no tenderness.  Granulation tissue in bed of previous abscess which has closed up significantly with little open portion  Musculoskeletal: Normal range of motion.  Neurological: She is alert and oriented to person, place, and time.  Skin:  Mild erythema of nasolabial fold forehead and chin.          Assessment & Plan:  Umbilical abscess: Almost resolved. Dressing applied in clinic. She is scheduled to see her GYN tomorrow.  Rosacea: Prescribed MetroGel. Side effects of MetroGel discussed. I will see her back next month for a reassessment for improvement.  This note has been created with Surveyor, quantity. Any transcriptional errors are unintentional.

## 2015-09-06 NOTE — Progress Notes (Signed)
F/up umbillical cord abscess. Pt reports feeling fine with no pain.  Pt is concern about a red bump located on L side of stomach.

## 2015-09-06 NOTE — Patient Instructions (Signed)
Rosacea Rosacea is a long-term (chronic) condition that affects the skin of the face, including the cheeks, nose, brow, and chin. This condition can also affect the eyes. Rosacea causes blood vessels near the surface of the skin to enlarge, which results in redness. CAUSES The cause of this condition is not known. Certain triggers can make rosacea worse, including:  Hot baths.  Exercise.  Sunlight.  Very hot or cold temperatures.  Hot or spicy foods and drinks.  Drinking alcohol.  Stress.  Taking blood pressure medicine.  Long-term use of topical steroids on the face. RISK FACTORS This condition is more likely to develop in:  People who are older than 37 years of age.  Women.  People who have light-colored skin (light complexion).  People who have a family history of rosacea. SYMPTOMS  Symptoms of this condition include:  Redness of the face.  Red bumps or pimples on the face.  A red, enlarged nose.  Blushing easily.  Red lines on the skin.  Irritated or burning feeling in the eyes.  Swollen eyelids. DIAGNOSIS This condition is diagnosed with a medical history and physical exam. TREATMENT There is no cure for this condition, but treatment can help to control your symptoms. Your health care provider may recommend that you see a skin specialist (dermatologist). Treatment may include:  Antibiotic medicines that are applied to the skin or taken as a pill.  Laser treatment to improve the appearance of the skin.  Surgery. This is rare. Your health care provider will also recommend the best way to take care of your skin. Even after your skin improves, you will likely need to continue treatment to prevent your rosacea from coming back. HOME CARE INSTRUCTIONS Skin Care Take care of your skin as told by your health care provider. You may be told to do these things:  Wash your skin gently two or more times each day.  Use mild soap.  Use a sunscreen or sunblock  with SPF 30 or greater.  Use gentle cosmetics that are meant for sensitive skin.  Shave with an electric shaver instead of a blade. Lifestyle  Try to keep track of what foods trigger this condition. Avoid any triggers. These may include:  Spicy foods.  Seafood.  Cheese.  Hot liquids.  Nuts.  Chocolate.  Iodized salt.  Do not drink alcohol.  Avoid extremely cold or hot temperatures.  Try to reduce your stress. If you need help, talk with your health care provider.  When you exercise, do these things to stay cool:  Limit your sun exposure.  Use a fan.  Do shorter and more frequent intervals of exercise. General Instructions   Keep all follow-up visits as told by your health care provider. This is important.  Take over-the-counter and prescription medicines only as told by your health care provider.  If your eyelids are affected, apply warm compresses to them. Do this as told by your health care provider.  If you were prescribed an antibiotic medicine, apply or take it as told by your health care provider. Do not stop using the antibiotic even if your condition improves. SEEK MEDICAL CARE IF:  Your symptoms get worse.  Your symptoms do not improve after two months of treatment.  You have new symptoms.  You have any changes in vision or you have problems with your eyes, such as redness or itching.  You feel depressed.  You lose your appetite.  You have trouble concentrating.   This information is not  intended to replace advice given to you by your health care provider. Make sure you discuss any questions you have with your health care provider.   Document Released: 11/29/2004 Document Revised: 07/13/2015 Document Reviewed: 12/29/2014 Elsevier Interactive Patient Education [employer].

## 2015-09-08 NOTE — Telephone Encounter (Signed)
CMA called pt, pt wasn't wasn't available to answer to take my call. I left message for pt to call me asap.

## 2015-09-15 ENCOUNTER — Ambulatory Visit: Payer: Self-pay | Admitting: Medical

## 2015-09-15 ENCOUNTER — Encounter: Payer: Self-pay | Admitting: Medical

## 2015-09-15 VITALS — BP 135/57 | HR 79 | Temp 98.5°F | Wt 230.8 lb

## 2015-09-15 DIAGNOSIS — Z5189 Encounter for other specified aftercare: Secondary | ICD-10-CM

## 2015-09-15 NOTE — Progress Notes (Signed)
Patient ID: Sara Booth, female   DOB: 07/08/1976, 39 y.o.   MRN: [id number]  History:  Sara Booth is a 39 y.o. [id number] who presents to clinic today for wound check. Patient states minimal pain. She denies erythema, edema, bleeding or drainage.     The following portions of the patient's history were reviewed and updated as appropriate: allergies, current medications, family history, past medical history, social history, past surgical history and problem list.  Review of Systems:  Review of Systems  Constitutional: Negative for fever.  Gastrointestinal: Positive for abdominal pain.  Skin: Negative for itching and rash.     Objective:  Physical Exam BP 135/57 mmHg  Pulse 79  Temp(Src) 98.5 F (36.9 C)  Wt 230 lb 12.8 oz (104.69 kg)  LMP 08/23/2015 (Exact Date) Physical Exam  Constitutional: She is oriented to person, place, and time. She appears well-developed.  HENT:  Head: Normocephalic.  Cardiovascular: Normal rate.   Pulmonary/Chest: Effort normal.  Abdominal: Soft. She exhibits no distension (mild tenderness to palpation of the RLQ ) and no mass. There is tenderness. There is no rebound and no guarding.  Incision is healing well. No erythema, edema, bleeding or drainage   Neurological: She is alert and oriented to person, place, and time.  Skin: Skin is warm and dry. No erythema.  Vitals reviewed.   Assessment & Plan:  Assessment: Wound check  Plans: Patient to follow-up with PCP in 2 weeks, if wound appears the same or better will be cleared to return to full duty at work Patient to return to [facility] as needed if symptoms were to change or worsen  Luvenia Redden, PA-C 09/15/2015 2:48 PM

## 2015-09-15 NOTE — Patient Instructions (Signed)
Incision Care ° An incision (cut) is when a surgeon cuts into your body. After surgery, the cut needs to be well cared for to keep it from getting infected.  °HOW TO CARE FOR YOUR CUT °· Take medicines only as told by your doctor. °· There are many different ways to close and cover a cut, including stitches, skin glue, and adhesive strips. Follow your doctor's instructions on: °¨ Care of the cut. °¨ Bandage (dressing) changes and removal. °¨ Cut closure removal. °· Do not take baths, swim, or use a hot tub until your doctor says it is okay. You may shower as told by your doctor. °· Return to your normal diet and activities as allowed by your doctor. °· Use medicine that helps lessen itching on your cut as told by your doctor. Do not pick or scratch at your cut. °· Drink enough fluids to keep your pee (urine) clear or pale yellow. °GET HELP IF: °· You have redness, puffiness (swelling), or pain at the site of your cut. °· You have fluid, blood, or pus coming from your cut. °· Your muscles ache. °· You have chills or you feel sick. °· You have a bad smell coming from the cut or bandage. °· Your cut opens up after stitches, staples, or adhesive strips have been removed. °· You keep feeling sick to your stomach (nauseous) or keep throwing up (vomiting). °· You have a fever. °· You are dizzy. °GET HELP RIGHT AWAY IF: °· You have a rash. °· You pass out (faint). °· You have trouble breathing. °MAKE SURE YOU:  °· Understand these instructions. °· Will watch your condition. °· Will get help right away if you are not doing well or get worse. °  °This information is not intended to replace advice given to you by your health care provider. Make sure you discuss any questions you have with your health care provider. °  °Document Released: 01/14/2012 Document Revised: 11/12/2014 Document Reviewed: 12/16/2013 °Elsevier Interactive Patient Education ©2016 Elsevier Inc. ° °

## 2015-10-06 ENCOUNTER — Ambulatory Visit: Payer: Self-pay | Attending: Family Medicine | Admitting: Family Medicine

## 2015-10-06 ENCOUNTER — Encounter: Payer: Self-pay | Admitting: Family Medicine

## 2015-10-06 VITALS — BP 102/67 | HR 68 | Temp 98.2°F | Resp 18 | Ht 68.5 in | Wt 237.0 lb

## 2015-10-06 DIAGNOSIS — Z87891 Personal history of nicotine dependence: Secondary | ICD-10-CM | POA: Insufficient documentation

## 2015-10-06 DIAGNOSIS — Z881 Allergy status to other antibiotic agents status: Secondary | ICD-10-CM | POA: Insufficient documentation

## 2015-10-06 DIAGNOSIS — Z91040 Latex allergy status: Secondary | ICD-10-CM | POA: Insufficient documentation

## 2015-10-06 DIAGNOSIS — Z880 Allergy status to penicillin: Secondary | ICD-10-CM | POA: Insufficient documentation

## 2015-10-06 DIAGNOSIS — J45909 Unspecified asthma, uncomplicated: Secondary | ICD-10-CM | POA: Insufficient documentation

## 2015-10-06 DIAGNOSIS — F419 Anxiety disorder, unspecified: Secondary | ICD-10-CM | POA: Insufficient documentation

## 2015-10-06 DIAGNOSIS — L719 Rosacea, unspecified: Secondary | ICD-10-CM

## 2015-10-06 DIAGNOSIS — L02216 Cutaneous abscess of umbilicus: Secondary | ICD-10-CM | POA: Insufficient documentation

## 2015-10-06 DIAGNOSIS — E669 Obesity, unspecified: Secondary | ICD-10-CM

## 2015-10-06 DIAGNOSIS — R1031 Right lower quadrant pain: Secondary | ICD-10-CM

## 2015-10-06 LAB — POCT URINALYSIS DIPSTICK
BILIRUBIN UA: NEGATIVE
GLUCOSE UA: NEGATIVE
Ketones, UA: NEGATIVE
Leukocytes, UA: NEGATIVE
NITRITE UA: NEGATIVE
Protein, UA: NEGATIVE
Spec Grav, UA: 1.025
UROBILINOGEN UA: 0.2
pH, UA: 5.5

## 2015-10-06 NOTE — Progress Notes (Signed)
Pt's here for f/up rosacea.   Pt reports she's having pain in lower abdomen. Pain rated 2/10 when sitting still. Described pain as  Sharp, nagging.

## 2015-10-06 NOTE — Patient Instructions (Signed)
Rosacea Rosacea is a long-term (chronic) condition that affects the skin of the face, including the cheeks, nose, brow, and chin. This condition can also affect the eyes. Rosacea causes blood vessels near the surface of the skin to enlarge, which results in redness. CAUSES The cause of this condition is not known. Certain triggers can make rosacea worse, including:  Hot baths.  Exercise.  Sunlight.  Very hot or cold temperatures.  Hot or spicy foods and drinks.  Drinking alcohol.  Stress.  Taking blood pressure medicine.  Long-term use of topical steroids on the face. RISK FACTORS This condition is more likely to develop in:  People who are older than 37 years of age.  Women.  People who have light-colored skin (light complexion).  People who have a family history of rosacea. SYMPTOMS  Symptoms of this condition include:  Redness of the face.  Red bumps or pimples on the face.  A red, enlarged nose.  Blushing easily.  Red lines on the skin.  Irritated or burning feeling in the eyes.  Swollen eyelids. DIAGNOSIS This condition is diagnosed with a medical history and physical exam. TREATMENT There is no cure for this condition, but treatment can help to control your symptoms. Your health care provider may recommend that you see a skin specialist (dermatologist). Treatment may include:  Antibiotic medicines that are applied to the skin or taken as a pill.  Laser treatment to improve the appearance of the skin.  Surgery. This is rare. Your health care provider will also recommend the best way to take care of your skin. Even after your skin improves, you will likely need to continue treatment to prevent your rosacea from coming back. HOME CARE INSTRUCTIONS Skin Care Take care of your skin as told by your health care provider. You may be told to do these things:  Wash your skin gently two or more times each day.  Use mild soap.  Use a sunscreen or sunblock  with SPF 30 or greater.  Use gentle cosmetics that are meant for sensitive skin.  Shave with an electric shaver instead of a blade. Lifestyle  Try to keep track of what foods trigger this condition. Avoid any triggers. These may include:  Spicy foods.  Seafood.  Cheese.  Hot liquids.  Nuts.  Chocolate.  Iodized salt.  Do not drink alcohol.  Avoid extremely cold or hot temperatures.  Try to reduce your stress. If you need help, talk with your health care provider.  When you exercise, do these things to stay cool:  Limit your sun exposure.  Use a fan.  Do shorter and more frequent intervals of exercise. General Instructions   Keep all follow-up visits as told by your health care provider. This is important.  Take over-the-counter and prescription medicines only as told by your health care provider.  If your eyelids are affected, apply warm compresses to them. Do this as told by your health care provider.  If you were prescribed an antibiotic medicine, apply or take it as told by your health care provider. Do not stop using the antibiotic even if your condition improves. SEEK MEDICAL CARE IF:  Your symptoms get worse.  Your symptoms do not improve after two months of treatment.  You have new symptoms.  You have any changes in vision or you have problems with your eyes, such as redness or itching.  You feel depressed.  You lose your appetite.  You have trouble concentrating.   This information is not   intended to replace advice given to you by your health care provider. Make sure you discuss any questions you have with your health care provider.   Document Released: 11/29/2004 Document Revised: 07/13/2015 Document Reviewed: 12/29/2014 Elsevier Interactive Patient Education 2016 Elsevier Inc.  

## 2015-10-06 NOTE — Progress Notes (Signed)
Subjective:    Patient ID: Sara Booth, female    DOB: 1975-12-03, 39 y.o.   MRN: [id number]  HPI 38 year old female with a history of umbilical abscess (secondary to complications from salpingo-oophorectomy) status post incision and drainage 3 months ago who was recently started on metronidazole gel for rosacea and is in today for follow-up visit.  She complains of right lower quadrant pain rated at 2/10 but increases to a 6/10 after palpation. She denies vaginal discharge, urinary symptoms, fever, nausea or vomiting.  Last visit with her GYN was on 09/15/15 for wound check and was informed to return as needed if symptoms worsened. Her rosacea is improving slowly with the use of metronidazole gel.  Past Medical History  Diagnosis Date  . Asthma   . Anxiety     Past Surgical History  Procedure Laterality Date  . Cholecystectomy    . Tubal ligation    . Laparoscopic unilateral salpingo oopherectomy Left 06/28/2015    Procedure: LAPAROSCOPIC LEFT SALPINGO OOPHORECTOMY;  Surgeon: Mora Bellman, MD;  Location: [facility];  Service: Gynecology;  Laterality: Left;  . Incision and drainage abscess N/A 07/14/2015    Procedure: INCISION AND DRAINAGE UMBILICAL ABSCESS;  Surgeon: Guss Bunde, MD;  Location: [facility];  Service: Gynecology;  Laterality: N/A;    Social History   Social History  . Marital Status: Married    Spouse Name: N/A  . Number of Children: N/A  . Years of Education: N/A   Occupational History  . Not on file.   Social History Main Topics  . Smoking status: Former Smoker -- 0.00 packs/day    Types: Cigarettes    Quit date: 04/02/2015  . Smokeless tobacco: Never Used  . Alcohol Use: No  . Drug Use: No     Comment: uses E-cigs without the nicotine  . Sexual Activity: Not Currently   Other Topics Concern  . Not on file   Social History Narrative    Allergies  Allergen Reactions  . Amoxicillin Other (See Comments)    Made her very hot and her lips turned blue.  She also had frequent urination.  . Erythromycin     Was told not to take it in the hospital  . Penicillins Other (See Comments)    She can take any cillins    Correction  Pt can  Not take  cillens per pt  . Latex Rash    Current Outpatient Prescriptions on File Prior to Visit  Medication Sig Dispense Refill  . acetaminophen (TYLENOL) 500 MG tablet Take 500 mg by mouth every 6 (six) hours as needed.    . cetirizine (ZYRTEC) 10 MG tablet Take 10 mg by mouth daily.    . fluticasone (FLONASE) 50 MCG/ACT nasal spray Place 1 spray into both nostrils daily.    Marland Kitchen ibuprofen (ADVIL,MOTRIN) 600 MG tablet Take 1 tablet (600 mg total) by mouth every 6 (six) hours as needed for moderate pain. 30 tablet 2  . metroNIDAZOLE (METROGEL) 1 % gel Apply topically daily. 60 g 1   No current facility-administered medications on file prior to visit.      Review of Systems  Constitutional: Negative for activity change and appetite change.  HENT: Negative for sinus pressure and sore throat.   Respiratory: Negative for chest tightness, shortness of breath and wheezing.   Gastrointestinal: Positive for abdominal pain. Negative for constipation and abdominal distention.  Genitourinary: Negative.   Musculoskeletal: Negative.   Skin:  See history of present illness  Psychiatric/Behavioral: Negative for behavioral problems and dysphoric mood.       Objective: Filed Vitals:   10/06/15 0949  BP: 102/67  Pulse: 68  Temp: 98.2 F (36.8 C)  TempSrc: Oral  Resp: 18  Height: 5' 8.5" (1.74 m)  Weight: 237 lb (107.502 kg)  SpO2: 95%      Physical Exam  Constitutional: She is oriented to person, place, and time. She appears well-developed and well-nourished.  Cardiovascular: Normal rate, normal heart sounds and intact distal pulses.   No murmur heard. Pulmonary/Chest: Effort normal and breath sounds normal. She has no wheezes. She has no rales. She exhibits no tenderness.  Abdominal: Soft. Bowel  sounds are normal. She exhibits no distension and no mass. There is tenderness (right lower quadrant tenderness).  Right lower quadrant diagonal scar extending from the umbilicus laterally which has almost healed completely, no discharge noticed, no sign of infection.  Musculoskeletal: Normal range of motion.  Neurological: She is alert and oriented to person, place, and time.          Assessment & Plan:  Rosacea: Improving. Continue to use of topical metronidazole.  Abdominal pain: Wound looks normal with a scar which has healed. Could be referred pain; explained to the patient that she would likely experience some residual pain as she has had multiple abdominal surgeries of recent but will need to notify the clinic at the onset of worsening symptoms, fever, vaginal discharge. Meanwhile will send off a UA to exclude a UTI.  Obesity: Advised on increasing physical activity, reducing portion sizes.  This note has been created with Surveyor, quantity. Any transcriptional errors are unintentional.    Advised to cut back portions, increase physical activity.

## 2015-10-11 NOTE — Telephone Encounter (Signed)
Patient was given some supplies for her wound on last OV.

## 2015-12-18 ENCOUNTER — Emergency Department (HOSPITAL_COMMUNITY)
Admission: EM | Admit: 2015-12-18 | Discharge: 2015-12-18 | Disposition: A | Discharge status: Home / Self Care | Payer: MEDICAID | Attending: Emergency Medicine | Admitting: Emergency Medicine

## 2015-12-18 ENCOUNTER — Encounter (HOSPITAL_COMMUNITY): Payer: Self-pay

## 2015-12-18 DIAGNOSIS — Z79899 Other long term (current) drug therapy: Secondary | ICD-10-CM | POA: Insufficient documentation

## 2015-12-18 DIAGNOSIS — J039 Acute tonsillitis, unspecified: Secondary | ICD-10-CM | POA: Insufficient documentation

## 2015-12-18 DIAGNOSIS — Z8659 Personal history of other mental and behavioral disorders: Secondary | ICD-10-CM | POA: Insufficient documentation

## 2015-12-18 DIAGNOSIS — J45909 Unspecified asthma, uncomplicated: Secondary | ICD-10-CM | POA: Insufficient documentation

## 2015-12-18 DIAGNOSIS — Z880 Allergy status to penicillin: Secondary | ICD-10-CM | POA: Insufficient documentation

## 2015-12-18 DIAGNOSIS — H9201 Otalgia, right ear: Secondary | ICD-10-CM

## 2015-12-18 DIAGNOSIS — Z7951 Long term (current) use of inhaled steroids: Secondary | ICD-10-CM | POA: Insufficient documentation

## 2015-12-18 DIAGNOSIS — J358 Other chronic diseases of tonsils and adenoids: Secondary | ICD-10-CM

## 2015-12-18 DIAGNOSIS — Z91040 Latex allergy status: Secondary | ICD-10-CM | POA: Insufficient documentation

## 2015-12-18 DIAGNOSIS — Z87891 Personal history of nicotine dependence: Secondary | ICD-10-CM | POA: Insufficient documentation

## 2015-12-18 MED ORDER — LORATADINE 10 MG PO TABS: 10.0000 mg | ORAL_TABLET | Freq: Every day | ORAL | Status: DC

## 2015-12-18 NOTE — ED Notes (Signed)
Declined W/C at D/C and was escorted to lobby by RN. 

## 2015-12-18 NOTE — ED Provider Notes (Signed)
CSN: [id number]     Arrival date & time 12/18/15  1200 History  By signing my name below, I, [name], attest that this documentation has been prepared under the direction and in the presence of non-physician practitioner, Abigail Butts, PA-C. Electronically Signed: [name], Scribe. 12/18/2015. 2:39 PM.      Chief Complaint  Patient presents with  . Otalgia  . Sore Throat    The history is provided by the patient. No language interpreter was used.   HPI Comments:  Sara Booth is a 40 y.o. female who presents to the Emergency Department complaining of  intermittent right sided sore throat and right ear pain x 2 weeks. She denies recent cough, congestion and fever. No alleviating factors noted. No treatments PTA.    Past Medical History  Diagnosis Date  . Asthma   . Anxiety    Past Surgical History  Procedure Laterality Date  . Cholecystectomy    . Tubal ligation    . Laparoscopic unilateral salpingo oopherectomy Left 06/28/2015    Procedure: LAPAROSCOPIC LEFT SALPINGO OOPHORECTOMY;  Surgeon: Mora Bellman, MD;  Location: [facility];  Service: Gynecology;  Laterality: Left;  . Incision and drainage abscess N/A 07/14/2015    Procedure: INCISION AND DRAINAGE UMBILICAL ABSCESS;  Surgeon: Guss Bunde, MD;  Location: [facility];  Service: Gynecology;  Laterality: N/A;   Family History  Problem Relation Age of Onset  . Cancer Maternal Grandmother     breast  . Depression Mother   . Heart disease Father    Social History  Substance Use Topics  . Smoking status: Former Smoker -- 0.00 packs/day    Types: Cigarettes    Quit date: 04/02/2015  . Smokeless tobacco: Never Used  . Alcohol Use: No   OB History    Gravida Para Term Preterm AB TAB SAB Ectopic Multiple Living   5 4 4  1  1   4      Review of Systems  Constitutional: Negative for fever and chills.  HENT: Positive for ear pain and sore throat. Negative for congestion, trouble swallowing and voice change.    Respiratory: Negative for cough and shortness of breath.   Cardiovascular: Negative for chest pain.     Allergies  Amoxicillin; Erythromycin; Penicillins; and Latex  Home Medications   Prior to Admission medications   Medication Sig Start Date End Date Taking? Authorizing Provider  acetaminophen (TYLENOL) 500 MG tablet Take 500 mg by mouth every 6 (six) hours as needed.    Historical Provider, MD  cetirizine (ZYRTEC) 10 MG tablet Take 10 mg by mouth daily.    Historical Provider, MD  fluticasone (FLONASE) 50 MCG/ACT nasal spray Place 1 spray into both nostrils daily.    Historical Provider, MD  ibuprofen (ADVIL,MOTRIN) 600 MG tablet Take 1 tablet (600 mg total) by mouth every 6 (six) hours as needed for moderate pain. 07/18/15   Truett Mainland, DO  loratadine (CLARITIN) 10 MG tablet Take 1 tablet (10 mg total) by mouth daily. 12/18/15   Cambre Matson, PA-C  metroNIDAZOLE (METROGEL) 1 % gel Apply topically daily. 09/06/15   Arnoldo Morale, MD   BP 133/79 mmHg  Pulse 98  Temp(Src) 98 F (36.7 C) (Oral)  Resp 16  Ht 5\' 7"  (1.702 m)  Wt 111.676 kg  BMI 38.55 kg/m2  SpO2 99%  LMP 12/10/2015 Physical Exam  Constitutional: She appears well-developed and well-nourished. No distress.  HENT:  Head: Normocephalic and atraumatic.  Right Ear: Tympanic membrane,  external ear and ear canal normal.  Left Ear: Tympanic membrane, external ear and ear canal normal.  Nose: Nose normal. No mucosal edema or rhinorrhea.  Mouth/Throat: Uvula is midline and mucous membranes are normal. Mucous membranes are not dry. No trismus in the jaw. No uvula swelling. No tonsillar abscesses.  Right tonsil with mild erythema, minimal edema, and visible tonsillith  No posterior oropharynx edema, erythema, or exudate   Eyes: Conjunctivae are normal.  Neck: Normal range of motion, full passive range of motion without pain and phonation normal. No tracheal tenderness, no spinous process tenderness and no muscular  tenderness present. No rigidity. No erythema and normal range of motion present. No Brudzinski's sign and no Kernig's sign noted.  Range of motion without pain  No midline or paraspinal tenderness Normal phonation No stridor Handling secretions without difficulty No nuchal rigidity or meningeal signs  Cardiovascular: Normal rate, regular rhythm and normal heart sounds.   Pulses:      Radial pulses are 2+ on the right side, and 2+ on the left side.  Pulmonary/Chest: Effort normal and breath sounds normal. No stridor. No respiratory distress. She has no decreased breath sounds. She has no wheezes.  Equal chest expansion, clear and equal breath sounds without focal wheezes, rhonchi or rales  Musculoskeletal: Normal range of motion.  Lymphadenopathy:       Head (right side): No submental, no preauricular, no posterior auricular and no occipital adenopathy present.       Head (left side): No submental, no preauricular, no posterior auricular and no occipital adenopathy present.    She has no cervical adenopathy.       Right cervical: No superficial cervical, no deep cervical and no posterior cervical adenopathy present.      Left cervical: No superficial cervical, no deep cervical and no posterior cervical adenopathy present.  Neurological: She is alert.  Alert and oriented Moves all extremities without ataxia  Skin: Skin is warm and dry. She is not diaphoretic.  Psychiatric: She has a normal mood and affect.  Nursing note and vitals reviewed.   ED Course  Procedures   DIAGNOSTIC STUDIES:  Oxygen Saturation is 99% on RA, normal by my interpretation.    COORDINATION OF CARE:  1:05 PM Will discharge with claritin and ENT referral. Discussed treatment plan with pt at bedside and pt agreed to plan.   MDM   Final diagnoses:  Tonsillith  Otalgia of right ear   Pt with sore throat and right otalgia.  Irritation of the right tonsil from tonsillith. There is no evidence of strep or  otitis media. No abx indicated at this time. Discharge with symptomatic tx and referral to ENT. No evidence of dehydration. Pt is tolerating secretions. Presentation not concerning for peritonsillar abscess or spread of infection to deep spaces of the throat; patent airway. Specific return precautions discussed. Recommended PCP follow up. Pt appears safe for discharge.  I personally performed the services described in this documentation, which was scribed in my presence. The recorded information has been reviewed and is accurate.     Jarrett Soho Cornelio Parkerson, PA-C 12/18/15 1439  Tanna Furry, MD 12/27/15 2253

## 2015-12-18 NOTE — Discharge Instructions (Signed)
1. Medications: Loratadine, flonase, usual home medications 2. Treatment: rest, drink plenty of fluids,  3. Follow Up: Please followup with your primary doctor in 2-3 days and ENT as needed for discussion of your diagnoses and further evaluation after today's visit; if you do not have a primary care doctor use the resource guide provided to find one; Please return to the ER for worsening symptoms

## 2015-12-18 NOTE — ED Notes (Addendum)
Pt reports onset 2 weeks right ear pain and right sided throat pain.  NAD.  No resp or swallowing difficulties. Pt also c/o right middle thumb swelling, pt reports doing repetitive motions at work.

## 2016-04-14 ENCOUNTER — Emergency Department (HOSPITAL_COMMUNITY)
Admission: EM | Admit: 2016-04-14 | Discharge: 2016-04-14 | Disposition: A | Discharge status: Home / Self Care | Payer: BLUE CROSS/BLUE SHIELD | Attending: Emergency Medicine | Admitting: Emergency Medicine

## 2016-04-14 ENCOUNTER — Encounter (HOSPITAL_COMMUNITY): Payer: Self-pay

## 2016-04-14 DIAGNOSIS — J45909 Unspecified asthma, uncomplicated: Secondary | ICD-10-CM | POA: Diagnosis not present

## 2016-04-14 DIAGNOSIS — L02211 Cutaneous abscess of abdominal wall: Secondary | ICD-10-CM | POA: Diagnosis present

## 2016-04-14 DIAGNOSIS — Z87891 Personal history of nicotine dependence: Secondary | ICD-10-CM | POA: Insufficient documentation

## 2016-04-14 DIAGNOSIS — L0291 Cutaneous abscess, unspecified: Secondary | ICD-10-CM

## 2016-04-14 MED ORDER — LIDOCAINE-EPINEPHRINE (PF) 2 %-1:200000 IJ SOLN
10.0000 mL | Freq: Once | INTRAMUSCULAR | Status: AC
  Administered 2016-04-14: 10 mL
  Filled 2016-04-14: qty 20

## 2016-04-14 MED ORDER — SULFAMETHOXAZOLE-TRIMETHOPRIM 800-160 MG PO TABS
1.0000 | ORAL_TABLET | Freq: Two times a day (BID) | ORAL | Status: AC
Start: 2016-04-14 — End: 2016-04-21

## 2016-04-14 NOTE — ED Notes (Signed)
Pt has large, red abscess to LLQ of abdomen that has been recurrent since August.

## 2016-04-14 NOTE — Discharge Instructions (Signed)
Take your antibiotics as prescribed.  Keep wound clean and dry. Return to the emergency department in 2 days for wound recheck. Return to the emergency department sooner if symptoms worsen or new onset of fever, abdominal pain, vomiting, worsening redness, swelling or drainage.

## 2016-04-14 NOTE — ED Provider Notes (Signed)
History  By signing my name below, I, Bea Graff, attest that this documentation has been prepared under the direction and in the presence of Harlene Ramus, [place]. Electronically Signed: Bea Graff, ED Scribe. 04/14/2016. 11:27 PM.  Chief Complaint  Patient presents with  . Abscess   The history is provided by the patient and medical records. No language interpreter was used.    HPI Comments:  Sara Booth is a 40 y.o. obese female who presents to the Emergency Department complaining of an abscess to the left lower abdomen that appeared about one year ago. Pt states she was seen for this at the community clinic several months ago and was told it would go away on its own. She reports associated white purulent drainage 3-4 weeks ago. She reports increased redness and swelling that began about one week ago. Pt reports some associated nausea. She has not taken anything for pain. Touching the area increases the pain. She denies alleviating factors. She denies fever, chills, vomiting, abdominal pain, hematochezia, dysuria, hematuria, vaginal bleeding or discharge. She states she had a cyst on her ovary removed, cholecystectomy and tubal ligation. LMP was 03/26/16. She reports having an abscess to the incision site of RUQ in August 2016 (about 10 months ago).  Past Medical History  Diagnosis Date  . Asthma   . Anxiety    Past Surgical History  Procedure Laterality Date  . Cholecystectomy    . Tubal ligation    . Laparoscopic unilateral salpingo oopherectomy Left 06/28/2015    Procedure: LAPAROSCOPIC LEFT SALPINGO OOPHORECTOMY;  Surgeon: Mora Bellman, MD;  Location: [facility];  Service: Gynecology;  Laterality: Left;  . Incision and drainage abscess N/A 07/14/2015    Procedure: INCISION AND DRAINAGE UMBILICAL ABSCESS;  Surgeon: Guss Bunde, MD;  Location: [facility];  Service: Gynecology;  Laterality: N/A;   Family History  Problem Relation Age of Onset  . Cancer Maternal Grandmother      breast  . Depression Mother   . Heart disease Father    Social History  Substance Use Topics  . Smoking status: Former Smoker -- 0.00 packs/day    Types: Cigarettes    Quit date: 04/02/2015  . Smokeless tobacco: Never Used  . Alcohol Use: No   OB History    Gravida Para Term Preterm AB TAB SAB Ectopic Multiple Living   5 4 4  1  1   4      Review of Systems  Constitutional: Negative for fever and chills.  Gastrointestinal: Negative for nausea, vomiting, abdominal pain and blood in stool.  Genitourinary: Negative for dysuria, hematuria, vaginal bleeding and vaginal discharge.  Skin: Positive for color change.    Allergies  Amoxicillin; Erythromycin; Penicillins; and Latex  Home Medications   Prior to Admission medications   Medication Sig Start Date End Date Taking? Authorizing Provider  acetaminophen (TYLENOL) 500 MG tablet Take 500 mg by mouth every 6 (six) hours as needed.    Historical Provider, MD  cetirizine (ZYRTEC) 10 MG tablet Take 10 mg by mouth daily.    Historical Provider, MD  fluticasone (FLONASE) 50 MCG/ACT nasal spray Place 1 spray into both nostrils daily.    Historical Provider, MD  ibuprofen (ADVIL,MOTRIN) 600 MG tablet Take 1 tablet (600 mg total) by mouth every 6 (six) hours as needed for moderate pain. 07/18/15   Truett Mainland, DO  loratadine (CLARITIN) 10 MG tablet Take 1 tablet (10 mg total) by mouth daily. 12/18/15   Jarrett Soho Muthersbaugh, PA-C  metroNIDAZOLE (METROGEL) 1 % gel Apply topically daily. 09/06/15   Arnoldo Morale, MD  sulfamethoxazole-trimethoprim (BACTRIM DS,SEPTRA DS) 800-160 MG tablet Take 1 tablet by mouth 2 (two) times daily. 04/14/16 04/21/16  Nona Dell, PA-C   Triage Vitals: BP 132/81 mmHg  Pulse 77  Temp(Src) 97.9 F (36.6 C) (Oral)  Resp 18  Ht 5\' 7"  (1.702 m)  Wt 230 lb (104.327 kg)  BMI 36.01 kg/m2  SpO2 97%  LMP 04/01/2016 Physical Exam  Constitutional: She is oriented to person, place, and time. She appears  well-developed and well-nourished.  HENT:  Head: Normocephalic and atraumatic.  Eyes: Conjunctivae and EOM are normal. Right eye exhibits no discharge. Left eye exhibits no discharge. No scleral icterus.  Cardiovascular: Normal rate.   Pulmonary/Chest: Effort normal. No respiratory distress.  Abdominal: Soft. Bowel sounds are normal. She exhibits no distension and no mass. There is no tenderness. There is no rebound and no guarding.  Musculoskeletal: She exhibits no edema.  Neurological: She is alert and oriented to person, place, and time.  Skin: Skin is warm and dry. There is erythema.  3 x 2 cm area of induration with surrounding erythema noted to left lower quadrant of abdomen. 1 x 1 cm area of induration palpated to subcutaneous tissue. Small amount of purulent drainage expelled with manipulation.  Nursing note and vitals reviewed.   ED Course  Procedures (including critical care time) DIAGNOSTIC STUDIES: Oxygen Saturation is 97% on RA, normal by my interpretation.   COORDINATION OF CARE: 10:18 PM- Will perform bedside ultrasound. Pt verbalizes understanding and agrees to plan.  10:45 PM- Bedside ultrasound performed. Will I & D abscess.  INCISION AND DRAINAGE PROCEDURE NOTE: Patient identification was confirmed and verbal consent was obtained. This procedure was performed by Harlene Ramus, PA-C at 10:52 PM. Site: left lower abdomen Sterile procedures observed Needle size: 25 G Anesthetic used (type and amt): Lidocaine 2% with Epinephrine (2 mLs) Blade size: 11  Drainage: moderate purulence Complexity: Complex Packing used: n/a Site anesthetized, incision made over site, wound drained and explored loculations, rinsed with copious amounts of normal saline, covered with dry, sterile dressing. Pt tolerated procedure well without complications. Instructions for care discussed verbally and pt provided with additional written instructions for homecare and f/u.  Medications   lidocaine-EPINEPHrine (XYLOCAINE W/EPI) 2 %-1:200000 (PF) injection 10 mL (10 mLs Infiltration Given 04/14/16 2259)    Labs Review Labs Reviewed - No data to display  Imaging Review No results found. I have personally reviewed and evaluated these images and lab results as part of my medical decision-making.  ULTRASOUND LIMITED SOFT TISSUE/ MUSCULOSKELETAL: evaluate for abscess Indication: induration, fluctuance and drainage noted at LLQ with surrounding erythema Linear probe used to evaluate area of interest in two planes. Findings:  Appx. 1x1.5cm fluid collection noted to subcutaneous tissue consistent with subcutaneous abscess, no evidence of deep tissue abscess Performed by: myself Images saved electronically   MDM   Final diagnoses:  Abscess    Patient with skin subcutaneous abscess amenable to incision and drainage. No evidence of deep tissue abscess on [place]. Abscess was not large enough to warrant packing or drain, wound recheck in 2 days. Encouraged home warm soaks and flushing.  Mild signs of cellulitis is surrounding skin.  Will d/c to home with antibiotics due to mild signs of cellulitis and hx of abscesses.   I personally performed the services described in this documentation, which was scribed in my presence. The recorded information has been reviewed and  is accurate.     Chesley Noon [place], [place] 04/14/16 2333  Julianne Rice, MD 04/15/16 1335

## 2016-04-17 ENCOUNTER — Emergency Department (HOSPITAL_COMMUNITY)
Admission: EM | Admit: 2016-04-17 | Discharge: 2016-04-17 | Disposition: A | Discharge status: Home / Self Care | Payer: BLUE CROSS/BLUE SHIELD | Attending: Emergency Medicine | Admitting: Emergency Medicine

## 2016-04-17 ENCOUNTER — Encounter (HOSPITAL_COMMUNITY): Payer: Self-pay | Admitting: Nurse Practitioner

## 2016-04-17 DIAGNOSIS — J45909 Unspecified asthma, uncomplicated: Secondary | ICD-10-CM | POA: Insufficient documentation

## 2016-04-17 DIAGNOSIS — Z91040 Latex allergy status: Secondary | ICD-10-CM | POA: Insufficient documentation

## 2016-04-17 DIAGNOSIS — Z79899 Other long term (current) drug therapy: Secondary | ICD-10-CM | POA: Insufficient documentation

## 2016-04-17 DIAGNOSIS — Z87891 Personal history of nicotine dependence: Secondary | ICD-10-CM | POA: Diagnosis not present

## 2016-04-17 DIAGNOSIS — Z4801 Encounter for change or removal of surgical wound dressing: Secondary | ICD-10-CM | POA: Diagnosis not present

## 2016-04-17 DIAGNOSIS — Z5189 Encounter for other specified aftercare: Secondary | ICD-10-CM

## 2016-04-17 MED ORDER — ONDANSETRON 4 MG PO TBDP
4.0000 mg | ORAL_TABLET | Freq: Once | ORAL | Status: AC
  Administered 2016-04-17: 4 mg via ORAL
  Filled 2016-04-17: qty 1

## 2016-04-17 NOTE — ED Notes (Signed)
Wound rebandaged with gauze and paper tape.

## 2016-04-17 NOTE — Discharge Instructions (Signed)
Incision and Drainage, Care After Refer to this sheet in the next few weeks. These instructions provide you with information on caring for yourself after your procedure. Your caregiver may also give you more specific instructions. Your treatment has been planned according to current medical practices, but problems sometimes occur. Call your caregiver if you have any problems or questions after your procedure. HOME CARE INSTRUCTIONS   If antibiotic medicine is given, take it as directed. Finish it even if you start to feel better.  Only take over-the-counter or prescription medicines for pain, discomfort, or fever as directed by your caregiver.  Keep all follow-up appointments as directed by your caregiver.  Change any bandages (dressings) as directed by your caregiver. Replace old dressings with clean dressings.  Wash your hands before and after caring for your wound. You will receive specific instructions for cleansing and caring for your wound.  SEEK MEDICAL CARE IF:   You have increased pain, swelling, or redness around the wound.  You have increased drainage, smell, or bleeding from the wound.  You have muscle aches, chills, or you feel generally sick.  You have a fever. MAKE SURE YOU:   Understand these instructions.  Will watch your condition.  Will get help right away if you are not doing well or get worse.   This information is not intended to replace advice given to you by your health care provider. Make sure you discuss any questions you have with your health care provider.   Document Released: 01/14/2012 Document Revised: 11/12/2014 Document Reviewed: 01/14/2012 Elsevier Interactive Patient Education 2016 [employer]  

## 2016-04-17 NOTE — ED Notes (Signed)
Pt verbalized understanding of d/c instructions and instructed to change bandage every day. Pt stable and NAD

## 2016-04-17 NOTE — ED Provider Notes (Signed)
CSN: [id number]     Arrival date & time 04/17/16  1539 History  By signing my name below, I, Ephriam Jenkins, attest that this documentation has been prepared under the direction and in the presence of non-physician practitioner, Josephina Gip, PA. Electronically Signed: Ephriam Jenkins, Scribe. 04/17/2016. 5:14 PM.    Chief Complaint  Patient presents with  . Wound Check    The history is provided by the patient. No language interpreter was used.    Sara Booth is a 40 y.o. female who presents to the Emergency Department for wound recheck. Pt was treated with I&D of abscess to lower abdomen 3 days ago. Pt has not showered or changed the bandage applied two days ago. Complains of mild soreness at the area of incision "like a bruise". Pt also complains of associated nausea that is unresolved from last visit. Denies vomiting and she has been tolerating PO. Pt has been taking her Bactrim as prescribed. Pt denies any fever, chills, vomiting, diarrhea, spreading erythema outside of her bandage or any other new symptoms.   Past Medical History  Diagnosis Date  . Asthma   . Anxiety    Past Surgical History  Procedure Laterality Date  . Cholecystectomy    . Tubal ligation    . Laparoscopic unilateral salpingo oopherectomy Left 06/28/2015    Procedure: LAPAROSCOPIC LEFT SALPINGO OOPHORECTOMY;  Surgeon: Mora Bellman, MD;  Location: [facility];  Service: Gynecology;  Laterality: Left;  . Incision and drainage abscess N/A 07/14/2015    Procedure: INCISION AND DRAINAGE UMBILICAL ABSCESS;  Surgeon: Guss Bunde, MD;  Location: [facility];  Service: Gynecology;  Laterality: N/A;   Family History  Problem Relation Age of Onset  . Cancer Maternal Grandmother     breast  . Depression Mother   . Heart disease Father    Social History  Substance Use Topics  . Smoking status: Former Smoker -- 0.00 packs/day    Types: Cigarettes    Quit date: 04/02/2015  . Smokeless tobacco: Never Used  . Alcohol Use: No   OB  History    Gravida Para Term Preterm AB TAB SAB Ectopic Multiple Living   5 4 4  1  1   4      Review of Systems  Constitutional: Negative for fever.  Gastrointestinal: Positive for nausea. Negative for vomiting.  Skin: Positive for wound.  All other systems reviewed and are negative.     Allergies  Amoxicillin; Erythromycin; Penicillins; and Latex  Home Medications   Prior to Admission medications   Medication Sig Start Date End Date Taking? Authorizing Provider  acetaminophen (TYLENOL) 500 MG tablet Take 500 mg by mouth every 6 (six) hours as needed.    Historical Provider, MD  cetirizine (ZYRTEC) 10 MG tablet Take 10 mg by mouth daily.    Historical Provider, MD  fluticasone (FLONASE) 50 MCG/ACT nasal spray Place 1 spray into both nostrils daily.    Historical Provider, MD  ibuprofen (ADVIL,MOTRIN) 600 MG tablet Take 1 tablet (600 mg total) by mouth every 6 (six) hours as needed for moderate pain. 07/18/15   Truett Mainland, DO  loratadine (CLARITIN) 10 MG tablet Take 1 tablet (10 mg total) by mouth daily. 12/18/15   Hannah Muthersbaugh, PA-C  metroNIDAZOLE (METROGEL) 1 % gel Apply topically daily. 09/06/15   Arnoldo Morale, MD  sulfamethoxazole-trimethoprim (BACTRIM DS,SEPTRA DS) 800-160 MG tablet Take 1 tablet by mouth 2 (two) times daily. 04/14/16 04/21/16  Nona Dell, PA-C   BP  146/70 mmHg  Pulse 87  Temp(Src) 98.3 F (36.8 C) (Oral)  Resp 18  SpO2 96%  LMP 04/01/2016 Physical Exam  Constitutional: She appears well-developed and well-nourished. No distress.  Nontoxic appearing  HENT:  Head: Normocephalic and atraumatic.  Right Ear: External ear normal.  Left Ear: External ear normal.  Eyes: Conjunctivae are normal. Right eye exhibits no discharge. Left eye exhibits no discharge. No scleral icterus.  Neck: Normal range of motion.  Cardiovascular: Normal rate.   Pulmonary/Chest: Effort normal.  Musculoskeletal: Normal range of motion.  Moves all extremities  spontaneously  Neurological: She is alert. Coordination normal.  Skin: Skin is warm and dry.  1.5 cm linear wound noted to RLQ with faint surrounding erythema of approximately 2 x 2 cm. Scant serous drainage without purulence. No induration or fluctuance. Mild TTP at site of wound. No tenderness of remaining abdomen  Psychiatric: She has a normal mood and affect. Her behavior is normal.  Nursing note and vitals reviewed.   ED Course  Procedures  DIAGNOSTIC STUDIES:  Oxygen Saturation is 96% on RA, normal by my interpretation.    COORDINATION OF CARE:  4:52 PM Will order medication for nausea. Discussed treatment plan with pt at bedside and pt agreed to plan.  Labs Review Labs Reviewed - No data to display  Imaging Review No results found. I have personally reviewed and evaluated these images and lab results as part of my medical decision-making.   EKG Interpretation None      MDM   Final diagnoses:  Visit for wound check   Pt after I&D of abscess two days ago. Chart reviewed in EPIC. Pt has been properly keeping the wound site clean and dry at home. The site is healthy appearing, with only a small amount of serous drainage; incision is open and without purulent drainage. Mild surrounding cellulitis which pt reports is improved from two days ago. No palpable fluctuance or induration indicating deeper infection or residual abscess. Discussed home care and return precautions. Pt is stable for discharge  I personally performed the services described in this documentation, which was scribed in my presence. The recorded information has been reviewed and is accurate.     Josephina Gip, PA-C 04/17/16 1714  Davonna Belling, MD 04/17/16 2326

## 2016-04-17 NOTE — ED Notes (Signed)
She returns to ED today for a wound recheck. She was here on Saturday and had I&D of L lower abd area and was told to return today. She reports dark discolored skin above the abscess and continued soreness at the site. She is alert and breathing easily

## 2016-08-06 IMAGING — US US ART/VEN ABD/PELV/SCROTUM DOPPLER LTD
1 series · 13 of 25 positions shown · non-contrast
Comparison: None.

CLINICAL DATA: Acute onset of generalized abdominal pain. Initial
encounter.

EXAM:
TRANSABDOMINAL AND TRANSVAGINAL ULTRASOUND OF PELVIS
DOPPLER ULTRASOUND OF OVARIES
TECHNIQUE: Both transabdominal and transvaginal ultrasound examinations of the
pelvis were performed. Transabdominal technique was performed for
global imaging of the pelvis including uterus, ovaries, adnexal
regions, and pelvic cul-de-sac.
It was necessary to proceed with endovaginal exam following the
transabdominal exam to visualize the uterus and ovaries in greater
detail. Color and duplex Doppler ultrasound was utilized to evaluate
blood flow to the ovaries.

[Series 1: us art/ven abd/pelv/scrotum doppler ltd · 0.24mm/px · 13 of 41 slices shown]
[im 1/41]
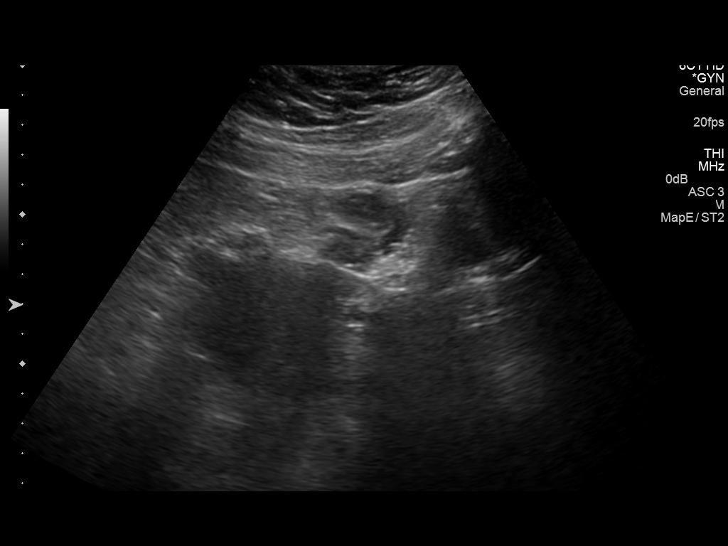
[im 4/41]
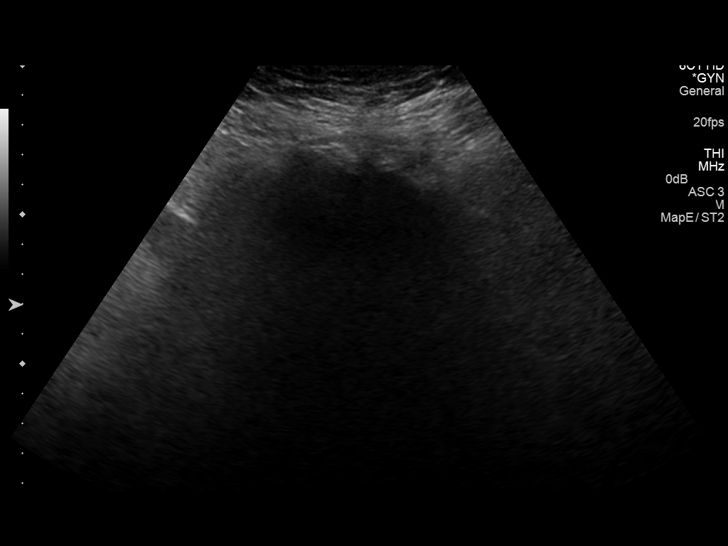
[im 7/41]
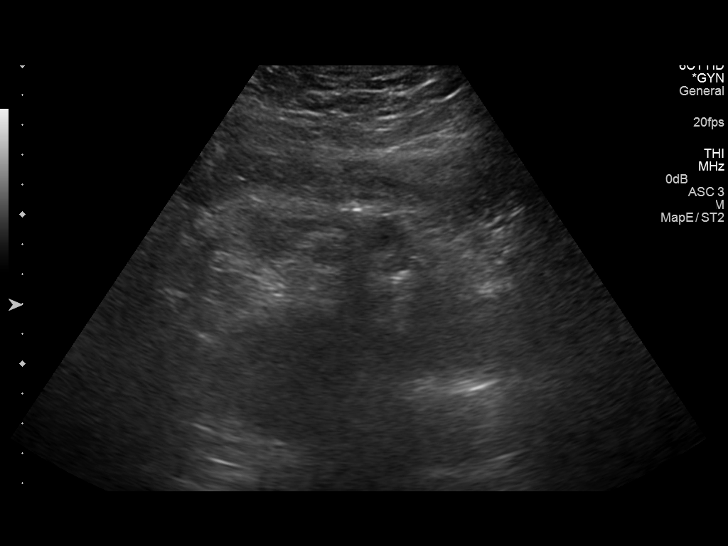
[im 11/41]
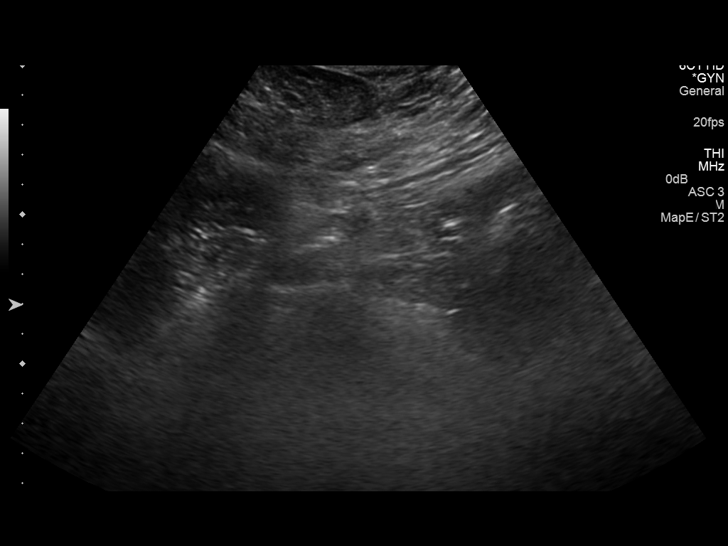
[im 14/41]
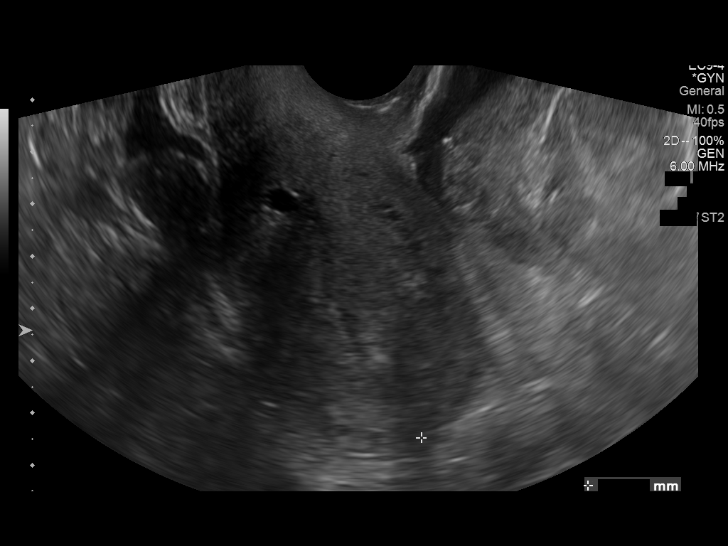
[im 17/41]
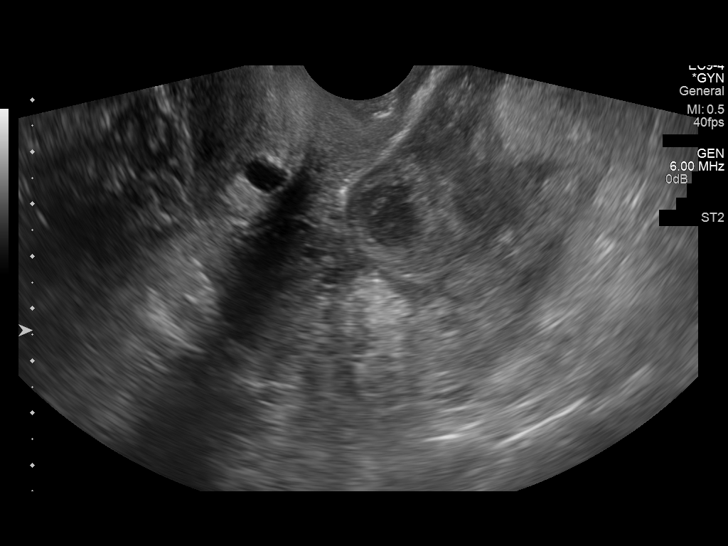
[im 21/41]
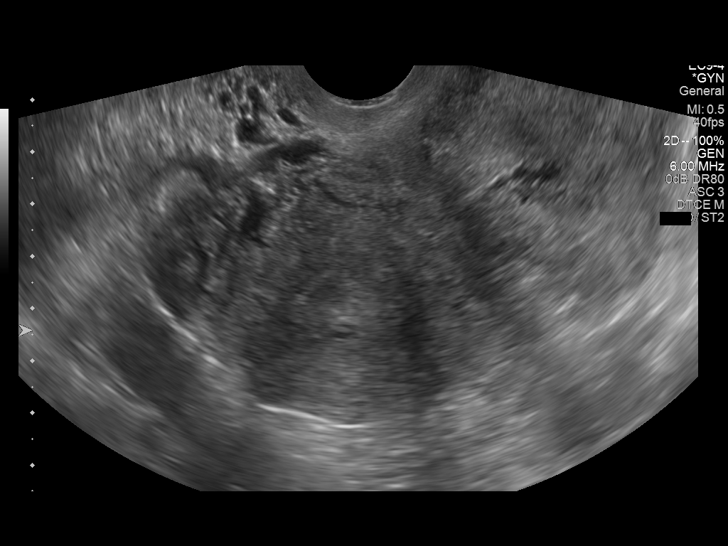
[im 24/41]
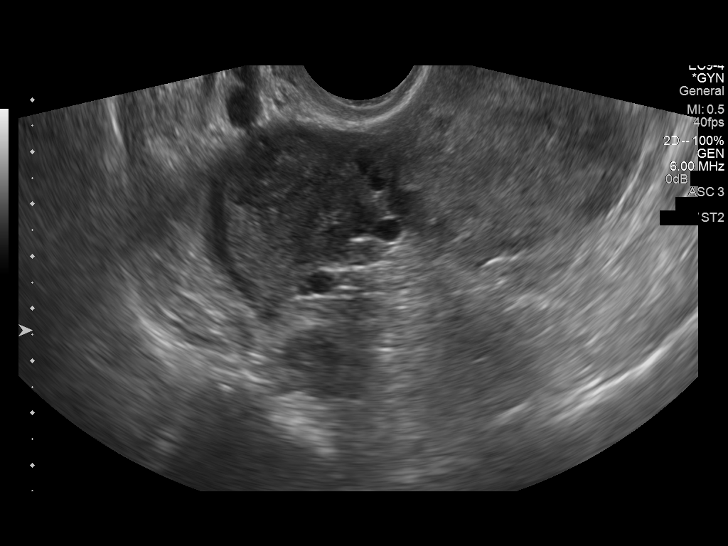
[im 27/41]
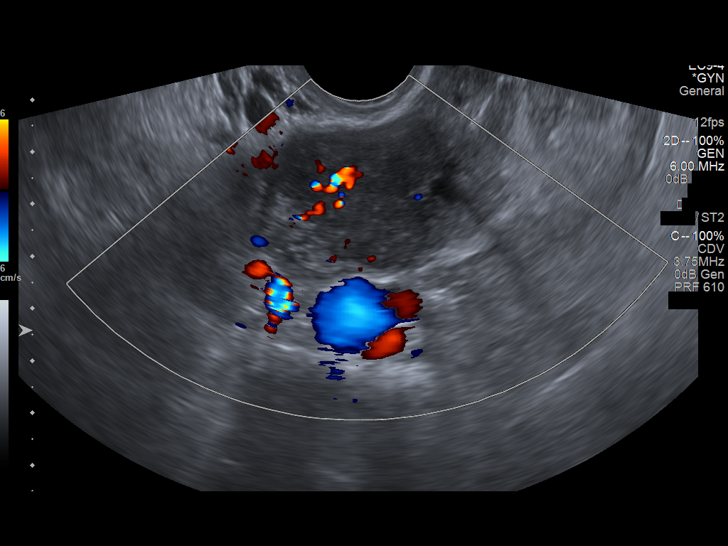
[im 31/41]
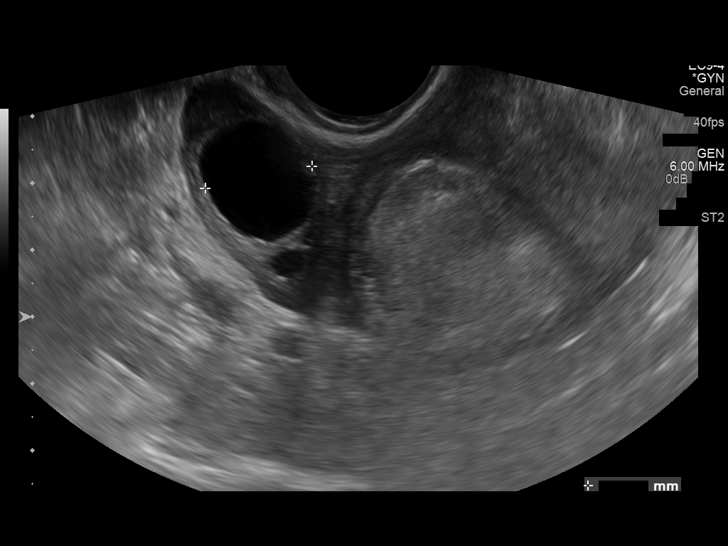
[im 34/41]
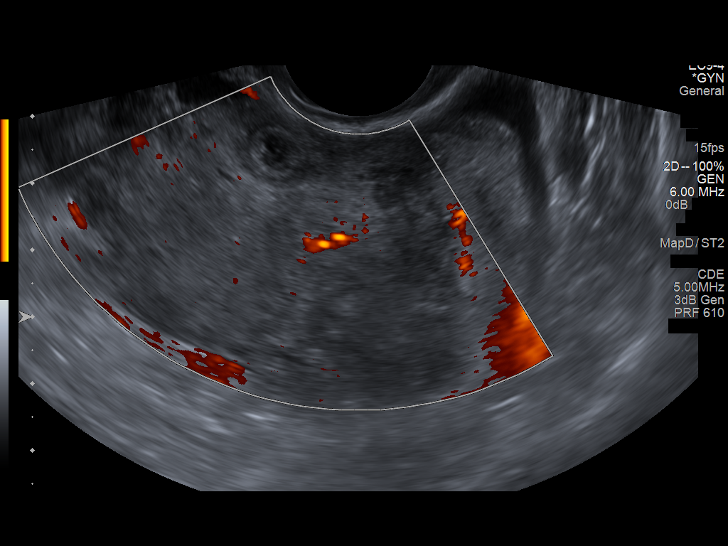
[im 37/41]
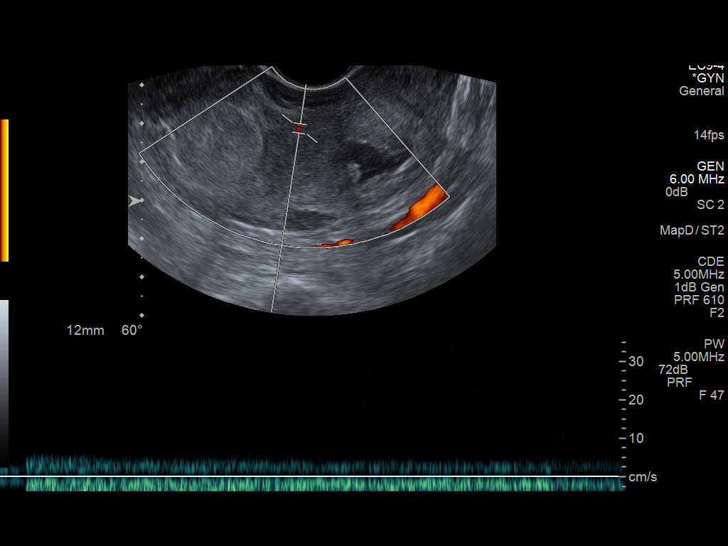
[im 41/41]
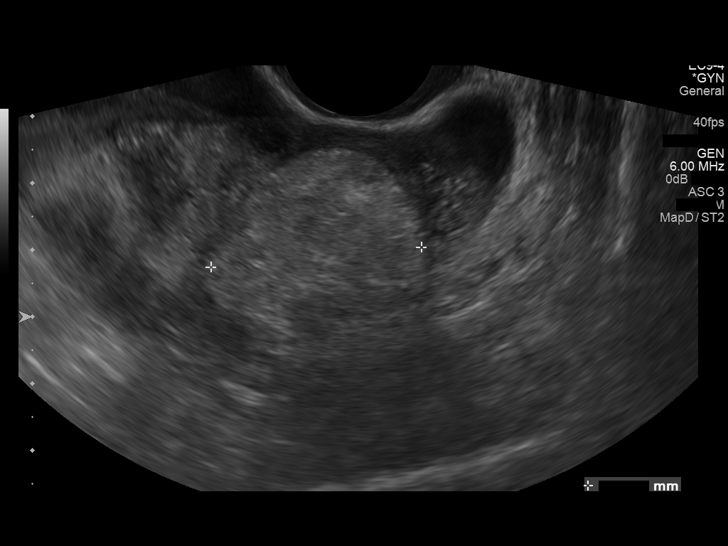

[13 of 25 positions shown; findings below may reference images not displayed]

FINDINGS: Uterus

Measurements: 8.4 x 4.8 x 5.6 cm. No fibroids or other mass
visualized. The uterus is retroverted.

Endometrium

Thickness: 0.5 cm.  No focal abnormality visualized.

Right ovary

Measurements: 4.2 x 2.8 x 3.1 cm. Normal appearance/no adnexal mass.

Left ovary

Measurements: 7.7 x 4.4 x 6.2 cm. Two rounded heterogeneous
hyperechoic lesions are again noted within the left ovary, measuring
up to 3.3 cm in size.

Pulsed Doppler evaluation of both ovaries demonstrates normal
low-resistance arterial and venous waveforms.

Other findings

A moderate amount of free fluid is noted within the pelvis. This is
particulate in nature, raising concern for some degree of
hemorrhage.
IMPRESSION: 1. New moderate amount of free fluid within the pelvis is
particulate in nature, raising concern for some degree of
hemorrhage. This may reflect hemorrhage from one of the
heterogeneous hyperechoic masses within the left ovary, which
measure up to 3.3 cm in size. Hemorrhage is new from the study
performed this morning. GYN Surgery consultation is recommended.
2. No evidence for ovarian torsion. The uterus is unremarkable in
appearance.

These results were called by telephone at the time of interpretation
on 04/21/2015 at [DATE] to Dr. FELNER CEOLA, who verbally
acknowledged these results.

## 2016-11-12 ENCOUNTER — Emergency Department (HOSPITAL_COMMUNITY)
Admission: EM | Admit: 2016-11-12 | Discharge: 2016-11-12 | Disposition: A | Discharge status: Home / Self Care | Payer: BLUE CROSS/BLUE SHIELD | Attending: Emergency Medicine | Admitting: Emergency Medicine

## 2016-11-12 ENCOUNTER — Emergency Department (HOSPITAL_COMMUNITY): Payer: BLUE CROSS/BLUE SHIELD

## 2016-11-12 ENCOUNTER — Encounter (HOSPITAL_COMMUNITY): Payer: Self-pay | Admitting: Emergency Medicine

## 2016-11-12 DIAGNOSIS — J45909 Unspecified asthma, uncomplicated: Secondary | ICD-10-CM | POA: Diagnosis not present

## 2016-11-12 DIAGNOSIS — Z91040 Latex allergy status: Secondary | ICD-10-CM | POA: Diagnosis not present

## 2016-11-12 DIAGNOSIS — R198 Other specified symptoms and signs involving the digestive system and abdomen: Secondary | ICD-10-CM | POA: Diagnosis not present

## 2016-11-12 DIAGNOSIS — F17210 Nicotine dependence, cigarettes, uncomplicated: Secondary | ICD-10-CM | POA: Insufficient documentation

## 2016-11-12 DIAGNOSIS — R109 Unspecified abdominal pain: Secondary | ICD-10-CM

## 2016-11-12 DIAGNOSIS — R1012 Left upper quadrant pain: Secondary | ICD-10-CM | POA: Insufficient documentation

## 2016-11-12 LAB — COMPREHENSIVE METABOLIC PANEL
ALT: 28 U/L (ref 14–54)
AST: 28 U/L (ref 15–41)
Albumin: 4 g/dL (ref 3.5–5.0)
Alkaline Phosphatase: 65 U/L (ref 38–126)
Anion gap: 6 (ref 5–15)
BUN: 9 mg/dL (ref 6–20)
CHLORIDE: 108 mmol/L (ref 101–111)
CO2: 24 mmol/L (ref 22–32)
Calcium: 9.5 mg/dL (ref 8.9–10.3)
Creatinine, Ser: 0.72 mg/dL (ref 0.44–1.00)
Glucose, Bld: 90 mg/dL (ref 65–99)
POTASSIUM: 3.9 mmol/L (ref 3.5–5.1)
Sodium: 138 mmol/L (ref 135–145)
Total Bilirubin: 0.6 mg/dL (ref 0.3–1.2)
Total Protein: 6.8 g/dL (ref 6.5–8.1)

## 2016-11-12 LAB — URINALYSIS, ROUTINE W REFLEX MICROSCOPIC
Bilirubin Urine: NEGATIVE
GLUCOSE, UA: NEGATIVE mg/dL
Hgb urine dipstick: NEGATIVE
Ketones, ur: NEGATIVE mg/dL
LEUKOCYTES UA: NEGATIVE
Nitrite: NEGATIVE
PH: 5 (ref 5.0–8.0)
Protein, ur: NEGATIVE mg/dL
Specific Gravity, Urine: 1.012 (ref 1.005–1.030)

## 2016-11-12 LAB — CBC
HEMATOCRIT: 47.4 % — AB (ref 36.0–46.0)
Hemoglobin: 16.8 g/dL — ABNORMAL HIGH (ref 12.0–15.0)
MCH: 31.8 pg (ref 26.0–34.0)
MCHC: 35.4 g/dL (ref 30.0–36.0)
MCV: 89.6 fL (ref 78.0–100.0)
Platelets: 229 10*3/uL (ref 150–400)
RBC: 5.29 MIL/uL — AB (ref 3.87–5.11)
RDW: 12.8 % (ref 11.5–15.5)
WBC: 13.6 10*3/uL — AB (ref 4.0–10.5)

## 2016-11-12 LAB — LIPASE, BLOOD: LIPASE: 26 U/L (ref 11–51)

## 2016-11-12 MED ORDER — ONDANSETRON 4 MG PO TBDP: ORAL_TABLET | ORAL | 0 refills | Status: DC

## 2016-11-12 MED ORDER — ONDANSETRON 4 MG PO TBDP
4.0000 mg | ORAL_TABLET | Freq: Once | ORAL | Status: AC
  Administered 2016-11-12: 4 mg via ORAL
  Filled 2016-11-12: qty 1

## 2016-11-12 MED ORDER — CEPHALEXIN 500 MG PO CAPS: 500.0000 mg | ORAL_CAPSULE | Freq: Four times a day (QID) | ORAL | 0 refills | Status: DC

## 2016-11-12 NOTE — ED Notes (Signed)
Pt is in no physical distress but it very upset that her husband called her saying he will be fired if she cant get him the car back so he can get to work.  Pt states that she just needs a prescription for nausea meds and discharge

## 2016-11-12 NOTE — ED Triage Notes (Addendum)
Cold/ cough runny nose  for a week and nauseous started today but no v/d, abd pain that started this am , her back hurts also

## 2016-11-12 NOTE — ED Provider Notes (Signed)
[facility] DEPT Provider Note   CSN: [id number] Arrival date & time: 11/12/16  1056  By signing my name below, I, Jeanell Sparrow, attest that this documentation has been prepared under the direction and in the presence of non-physician practitioner, Etta Quill, NP. Electronically Signed: Jeanell Sparrow, Scribe. 11/12/2016. 4:59 PM.  History   Chief Complaint Chief Complaint  Patient presents with  . Cough  . Abdominal Pain   The history is provided by the patient. No language interpreter was used.  Cough  This is a new problem. The current episode started more than 2 days ago. The problem occurs constantly. The problem has not changed since onset.There has been no fever. Her past medical history is significant for asthma.  Abdominal Pain   Pertinent negatives include vomiting and dysuria.   HPI Comments: Sara Booth is a 41 y.o. female with a PMHx of asthma who presents to the Emergency Department complaining of intermittent moderate cough that started about a week ago. She states she has been having gradually worsening URI-like symptoms.She has been taking cough drops with some relief. She reports associated abdominal pain and lower back pain. She admits to a prior hx of strep throat secondary to surgery.   Pt also complaints of discharge from her umbilicus. She describes the discharge as reddish brown. She denies any urinary symptoms, vomiting, or other complaints.   Past Medical History:  Diagnosis Date  . Anxiety   . Asthma     Patient Active Problem List   Diagnosis Date Noted  . Rosacea 09/06/2015  . Abscess, umbilical [id number]  . Post op infection 07/15/2015  . Subcutaneous abscess 07/14/2015  . Dermoid cyst of left ovary   . Pelvic adhesions   . Amenorrhea 03/23/2013  . Bipolar disorder ([place]) 03/03/2012  . Sinus infection 12/20/2011  . Callus of foot 01/12/2011  . PAP SMER CERV W/HI GRADE SQUAMOUS INTRAEPITH LES 10/19/2010  . IBS 07/09/2008  . IRREGULAR  MENSTRUAL CYCLE 09/19/2007  . ANXIETY STATE NOS 01/10/2007  . OBESITY, NOS 01/02/2007  . TOBACCO DEPENDENCE 01/02/2007  . RHINITIS, ALLERGIC 01/02/2007  . ASTHMA, PERSISTENT 01/02/2007  . CERVICAL DYSPLASIA 01/02/2007    Past Surgical History:  Procedure Laterality Date  . CHOLECYSTECTOMY    . INCISION AND DRAINAGE ABSCESS N/A 07/14/2015   Procedure: INCISION AND DRAINAGE UMBILICAL ABSCESS;  Surgeon: Guss Bunde, MD;  Location: [facility];  Service: Gynecology;  Laterality: N/A;  . LAPAROSCOPIC UNILATERAL SALPINGO OOPHERECTOMY Left 06/28/2015   Procedure: LAPAROSCOPIC LEFT SALPINGO OOPHORECTOMY;  Surgeon: Mora Bellman, MD;  Location: [facility];  Service: Gynecology;  Laterality: Left;  . TUBAL LIGATION      OB History    Gravida Para Term Preterm AB Living   5 4 4   1 4    SAB TAB Ectopic Multiple Live Births   1               Home Medications    Prior to Admission medications   Medication Sig Start Date End Date Taking? Authorizing Provider  acetaminophen (TYLENOL) 500 MG tablet Take 500 mg by mouth every 6 (six) hours as needed.    Historical Provider, MD  cetirizine (ZYRTEC) 10 MG tablet Take 10 mg by mouth daily.    Historical Provider, MD  fluticasone (FLONASE) 50 MCG/ACT nasal spray Place 1 spray into both nostrils daily.    Historical Provider, MD  ibuprofen (ADVIL,MOTRIN) 600 MG tablet Take 1 tablet (600 mg total) by mouth every 6 (six) hours  as needed for moderate pain. 07/18/15   Truett Mainland, DO  loratadine (CLARITIN) 10 MG tablet Take 1 tablet (10 mg total) by mouth daily. 12/18/15   Hannah Muthersbaugh, PA-C  metroNIDAZOLE (METROGEL) 1 % gel Apply topically daily. 09/06/15   Arnoldo Morale, MD    Family History Family History  Problem Relation Age of Onset  . Depression Mother   . Heart disease Father   . Cancer Maternal Grandmother     breast    Social History Social History  Substance Use Topics  . Smoking status: Current Every Day Smoker    Packs/day: 0.00     Types: Cigarettes    Last attempt to quit: 04/02/2015  . Smokeless tobacco: Never Used  . Alcohol use No     Allergies   Amoxicillin; Erythromycin; Penicillins; and Latex   Review of Systems Review of Systems  Respiratory: Positive for cough.   Gastrointestinal: Positive for abdominal pain. Negative for vomiting.  Genitourinary: Negative for dysuria.  Musculoskeletal: Positive for back pain (Lower).  All other systems reviewed and are negative.    Physical Exam Updated Vital Signs BP 146/77 (BP Location: Right Arm)   Pulse 72   Temp 98 F (36.7 C) (Oral)   Resp 16   LMP 10/24/2016   SpO2 100%   Physical Exam  Constitutional: She appears well-developed and well-nourished. No distress.  HENT:  Head: Normocephalic.  Eyes: Conjunctivae are normal.  Neck: Neck supple.  Cardiovascular: Normal rate and regular rhythm.   Pulmonary/Chest: Effort normal.  Abdominal: Soft. There is tenderness. There is no rebound.  LUQ is TTP without guarding. Lower quadrants are non-TTP and soft.   Musculoskeletal: Normal range of motion.  TTP to bilateral back.   Neurological: She is alert.  Skin: Skin is warm and dry.  Psychiatric: She has a normal mood and affect.  Nursing note and vitals reviewed.    ED Treatments / Results  DIAGNOSTIC STUDIES: Oxygen Saturation is 100% on RA, normal by my interpretation.    COORDINATION OF CARE: 5:03 PM- Pt advised of plan for treatment and pt agrees.  Labs (all labs ordered are listed, but only abnormal results are displayed) Labs Reviewed  CBC - Abnormal; Notable for the following:       Result Value   WBC 13.6 (*)    RBC 5.29 (*)    Hemoglobin 16.8 (*)    HCT 47.4 (*)    All other components within normal limits  LIPASE, BLOOD  COMPREHENSIVE METABOLIC PANEL  URINALYSIS, ROUTINE W REFLEX MICROSCOPIC    EKG  EKG Interpretation None       Radiology Dg Chest 2 View  Result Date: 11/12/2016 CLINICAL DATA:  Cough and cold  symptoms for the past week associated with nausea and chills. Current smoker. EXAM: CHEST  2 VIEW COMPARISON:  PA and lateral chest x-ray of July 03, 2004 FINDINGS: The lungs are adequately inflated and clear. The heart and pulmonary vascularity are normal. The mediastinum is normal in width. There is no pleural effusion. The bony thorax exhibits no acute abnormality. IMPRESSION: There is no active cardiopulmonary disease. Electronically Signed   By: Mayu Ronk  [place] M.D.   On: 11/12/2016 12:48    Procedures Procedures (including critical care time)  Medications Ordered in ED Medications - No data to display   Initial Impression / Assessment and Plan / ED Course  I have reviewed the triage vital signs and the nursing notes.  Pertinent labs & imaging results that  were available during my care of the patient were reviewed by me and considered in my medical decision making (see chart for details).  Clinical Course   Patient is nontoxic, nonseptic appearing, in no apparent distress.  Labs and vitals reviewed.  Patient does not meet the SIRS or Sepsis criteria.  On repeat exam patient does not have a surgical abdomen and there are no peritoneal signs.  No indication of appendicitis, bowel obstruction, bowel perforation, cholecystitis, diverticulitis, PID or ectopic pregnancy.  Patient is unable to remain for additional workup due to family constraints.  Discussed with Dr.Tegeler. Will treat with zofran for nausea and keflex for umbilical drainage.  Patient discharged home and given strict instructions for follow-up with their primary care physician.  I have also discussed reasons to return immediately to the ER.  Patient expresses understanding and agrees with plan.      Final Clinical Impressions(s) / ED Diagnoses   Final diagnoses:  Abdominal pain, unspecified abdominal location  Umbilical discharge    New Prescriptions New Prescriptions   CEPHALEXIN (KEFLEX) 500 MG CAPSULE    Take 1  capsule (500 mg total) by mouth 4 (four) times daily.   ONDANSETRON (ZOFRAN ODT) 4 MG DISINTEGRATING TABLET    4mg  ODT q6 hours prn nausea/vomit   I personally performed the services described in this documentation, which was scribed in my presence. The recorded information has been reviewed and is accurate.     Etta Quill, NP 11/12/16 [place], MD 11/13/16 [phone]

## 2016-11-15 ENCOUNTER — Encounter: Payer: Self-pay | Admitting: Physician Assistant

## 2016-11-15 ENCOUNTER — Ambulatory Visit: Payer: BLUE CROSS/BLUE SHIELD | Attending: Internal Medicine | Admitting: Physician Assistant

## 2016-11-15 VITALS — BP 123/81 | HR 70 | Temp 98.4°F | Resp 16

## 2016-11-15 DIAGNOSIS — L02216 Cutaneous abscess of umbilicus: Secondary | ICD-10-CM | POA: Diagnosis not present

## 2016-11-15 DIAGNOSIS — Z79899 Other long term (current) drug therapy: Secondary | ICD-10-CM | POA: Diagnosis not present

## 2016-11-15 NOTE — Progress Notes (Signed)
Sara Booth, is a 41 y.o. female  [id number]  [id number]  DOB - [birth year]  Subjective:  Chief Complaint and HPI: Sara Booth is a 41 y.o. female here today for an ED f/up visit from 11/12/2016.  She was seen for cough and an infected area around her umbilicus.  She was started on Keflex.  She is feeling much better than when she went to the ED.  Cough is 90% resolved.  She has not had any fever or chills.  The area around her umbilicus is less painful than it was.    ED/Hospital notes reviewed.    ROS:   Constitutional:  No f/c, No night sweats, No unexplained weight loss. EENT:  No vision changes, No blurry vision, No hearing changes. No mouth, throat, or ear problems.  Respiratory: minimal cough, No SOB Cardiac: No CP, no palpitations GI:  No abd pain, No N/V/D. GU: No Urinary s/sx Musculoskeletal: No joint pain Neuro: No headache, no dizziness, no motor weakness.  Skin: No rash Endocrine:  No polydipsia. No polyuria.  Psych: Denies SI/HI  No problems updated.  ALLERGIES: Allergies  Allergen Reactions  . Amoxicillin Other (See Comments)    Made her very hot and her lips turned blue. She also had frequent urination.  . Erythromycin     Was told not to take it in the hospital  . Penicillins Other (See Comments)    She can take any cillins    Correction  Pt can  Not take  cillens per pt  . Latex Rash    PAST MEDICAL HISTORY: Past Medical History:  Diagnosis Date  . Anxiety   . Asthma     MEDICATIONS AT HOME: Prior to Admission medications   Medication Sig Start Date End Date Taking? Authorizing Provider  acetaminophen (TYLENOL) 500 MG tablet Take 500 mg by mouth every 6 (six) hours as needed.    Historical Provider, MD  cephALEXin (KEFLEX) 500 MG capsule Take 1 capsule (500 mg total) by mouth 4 (four) times daily. 11/12/16   Etta Quill, NP  cetirizine (ZYRTEC) 10 MG tablet Take 10 mg by mouth daily.    Historical Provider, MD  fluticasone (FLONASE) 50  MCG/ACT nasal spray Place 1 spray into both nostrils daily.    Historical Provider, MD  ibuprofen (ADVIL,MOTRIN) 600 MG tablet Take 1 tablet (600 mg total) by mouth every 6 (six) hours as needed for moderate pain. 07/18/15   Truett Mainland, DO  loratadine (CLARITIN) 10 MG tablet Take 1 tablet (10 mg total) by mouth daily. 12/18/15   Hannah Muthersbaugh, PA-C  metroNIDAZOLE (METROGEL) 1 % gel Apply topically daily. 09/06/15   Arnoldo Morale, MD  ondansetron (ZOFRAN ODT) 4 MG disintegrating tablet 4mg  ODT q6 hours prn nausea/vomit 11/12/16   Etta Quill, NP     Objective:  EXAM:   Vitals:   11/15/16 1633  BP: 123/81  Pulse: 70  Resp: 16  Temp: 98.4 F (36.9 C)  TempSrc: Oral  SpO2: 96%    General appearance : A&OX3. NAD. Non-toxic-appearing HEENT: Atraumatic and Normocephalic.  PERRLA. EOM intact.   Neck: supple, no JVD. No cervical lymphadenopathy. No thyromegaly Chest/Lungs:  Breathing-non-labored, Good air entry bilaterally, breath sounds normal without rales, rhonchi, or wheezing  CVS: S1 S2 regular, no murmurs, gallops, rubs  Abdomen: Bowel sounds present, Non tender and not distended with no gaurding, rigidity or rebound.  Skin surrounding umbilicus is warm, dry, and intact without erythema or induration.  Upon spreading the skin  around the umbilicus, there is visible slightly purulent drainage and minimal erythema and swelling.   Extremities: Bilateral Lower Ext shows no edema, both legs are warm to touch with = pulse throughout Neurology:  CN II-XII grossly intact, Non focal.   Psych:  TP linear. J/I WNL. Normal speech. Appropriate eye contact and affect.  Skin:  No Rash  Data Review No results found for: HGBA1C   Assessment & Plan   1. Abscess, umbilical Continue keflex.  Salt water soaks tid.  Otherwise keep clean and dry.   Patient have been counseled extensively about nutrition and exercise  Return in about 1 month (around 12/16/2016) for Dr Jarold Song for CPE and fasting  blood work.  The patient was given clear instructions to go to ER or return to medical center if symptoms don't improve, worsen or new problems develop. The patient verbalized understanding. The patient was told to call to get lab results if they haven't heard anything in the next week.     Freeman Caldron, PA-C [facility] [place], [place]   11/15/2016, 4:52 PMPatient ID: Sara Booth, female   DOB: 09-Apr-1976, 41 y.o.   MRN: [id number]

## 2017-03-19 IMAGING — US US TRANSVAGINAL NON-OB
1 series · 14 of 25 positions shown · non-contrast
Comparison: 04/21/2015 CT and ultrasound

CLINICAL DATA: Follow-up left ovarian cyst. Right lower quadrant
pain today. Gravida 5 para 4. Previous BTL.

EXAM:
ULTRASOUND PELVIS TRANSVAGINAL
TECHNIQUE: Transvaginal ultrasound examination of the pelvis was performed
including evaluation of the uterus, ovaries, adnexal regions, and
pelvic cul-de-sac.

[Series 1: us non-ob tv/pel · 37 acquisitions, 14 frames shown]
[im 1/37]
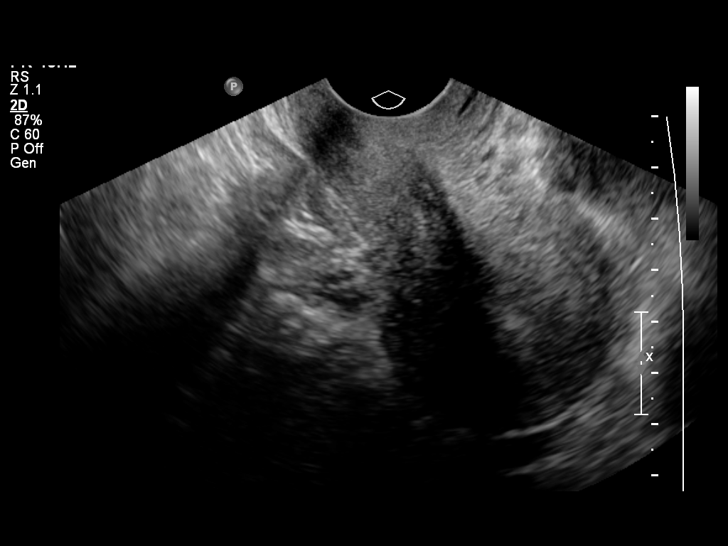
[im 4/37]
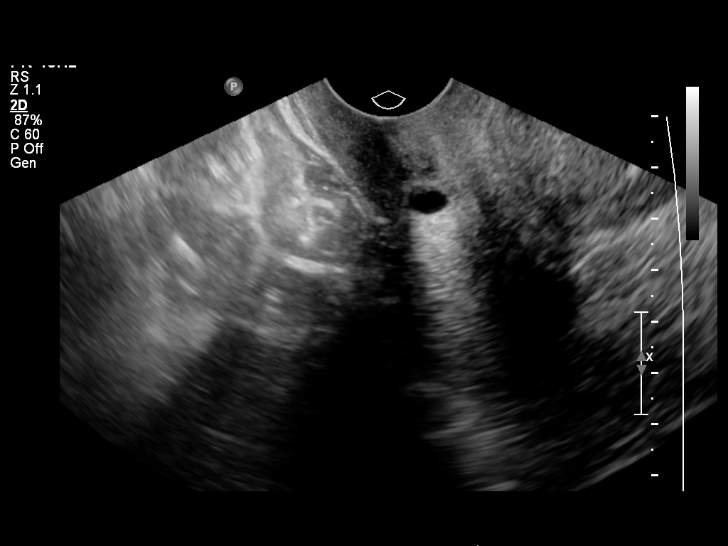
[im 7/37]
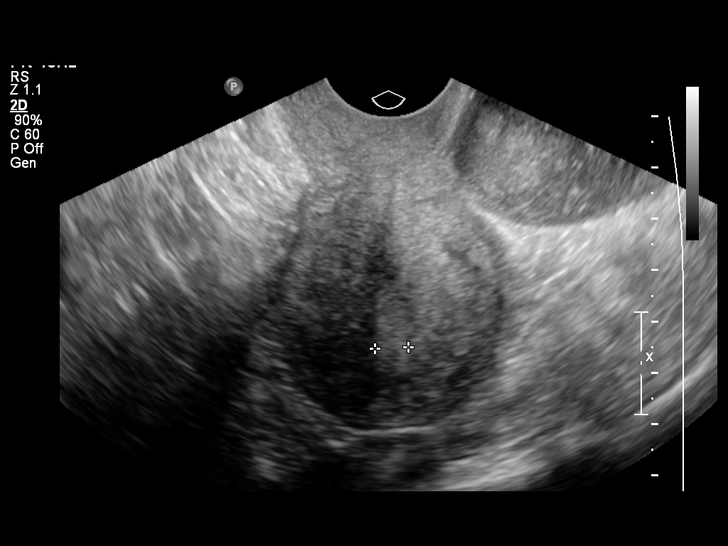
[im 10/37]
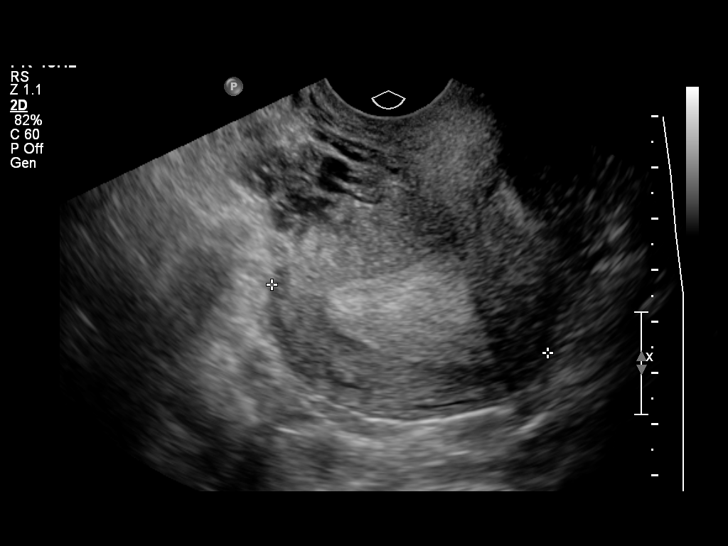
[im 13/37]
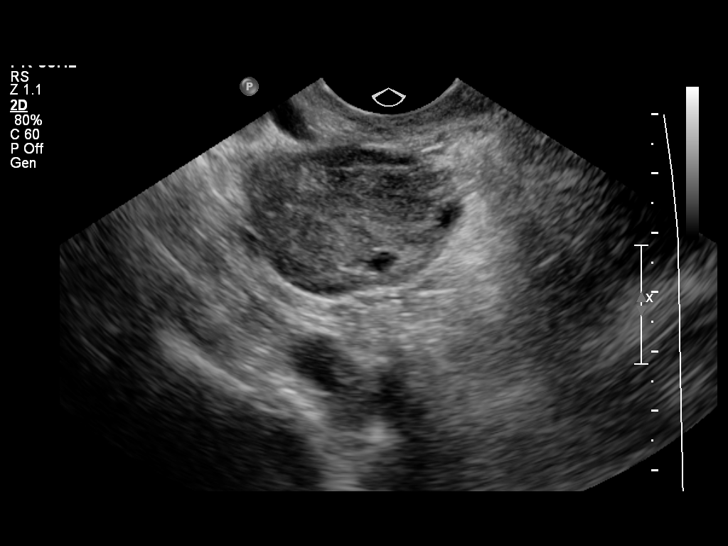
[im 14/37]
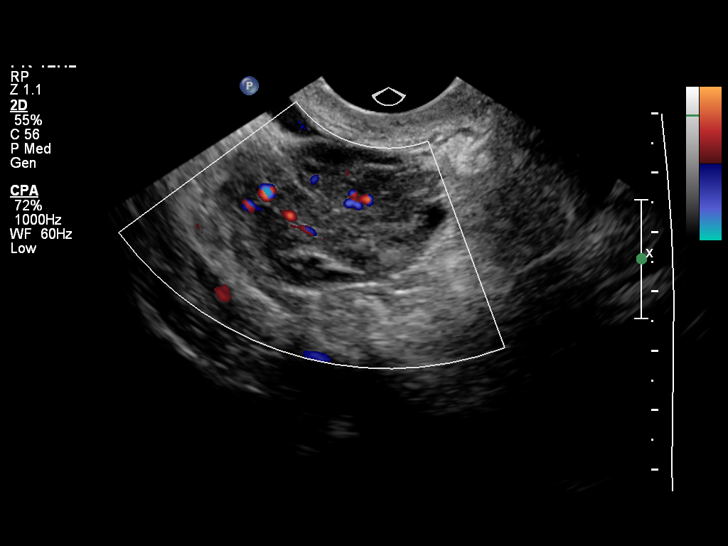
[im 17/37]
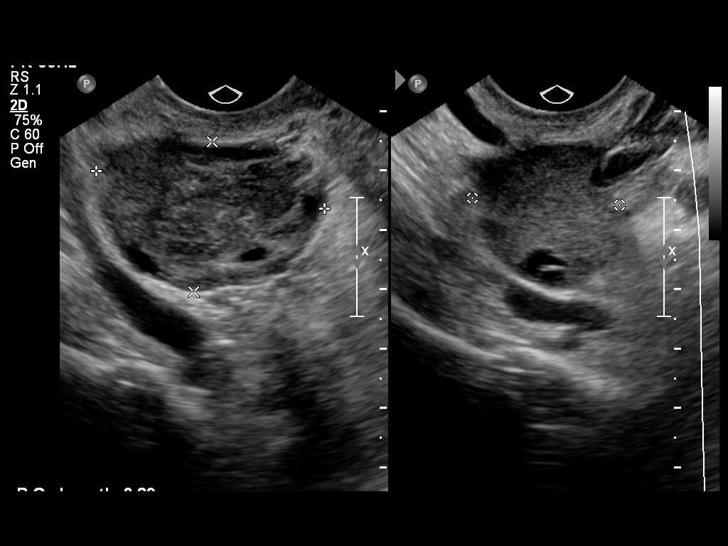
[im 20/37]
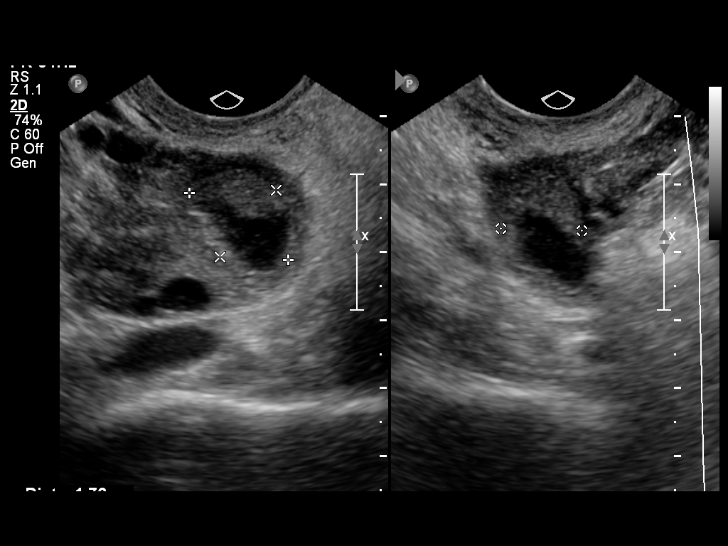
[im 23/37]
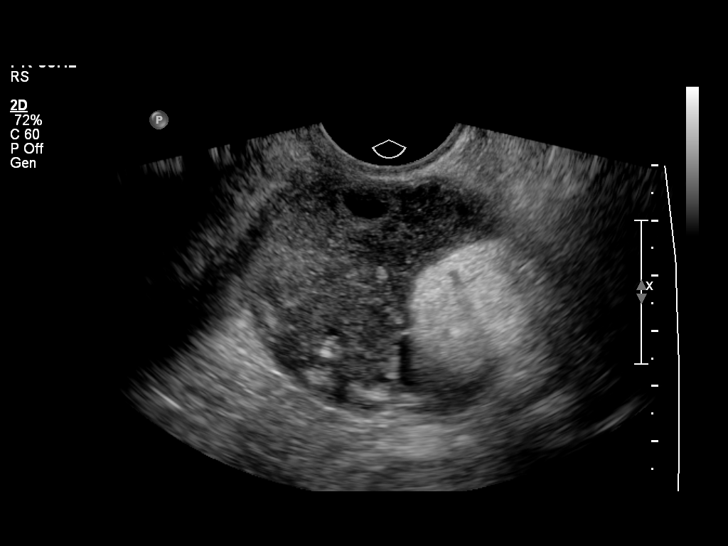
[im 25/37]
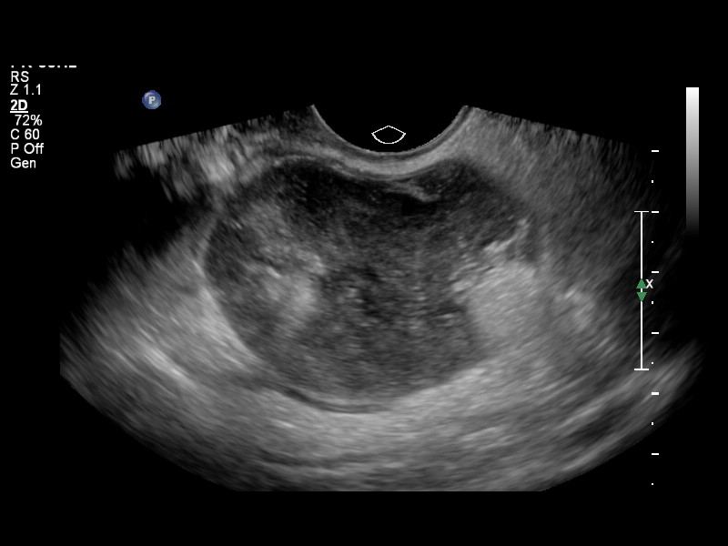
[im 28/37]
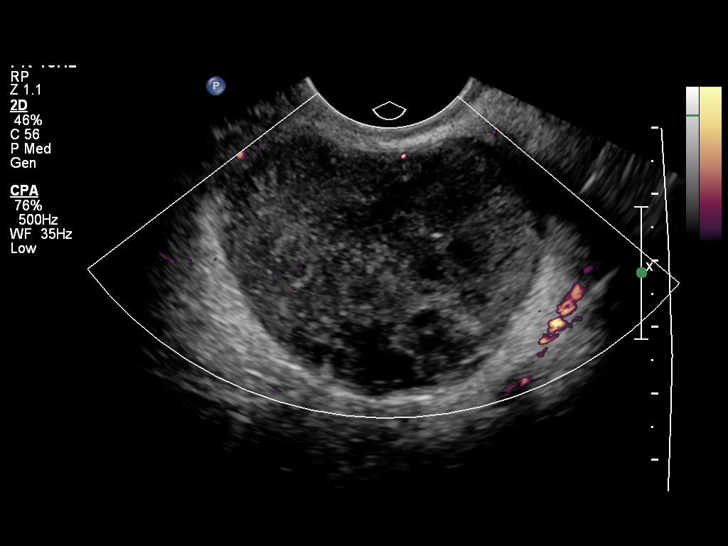
[im 31/37]
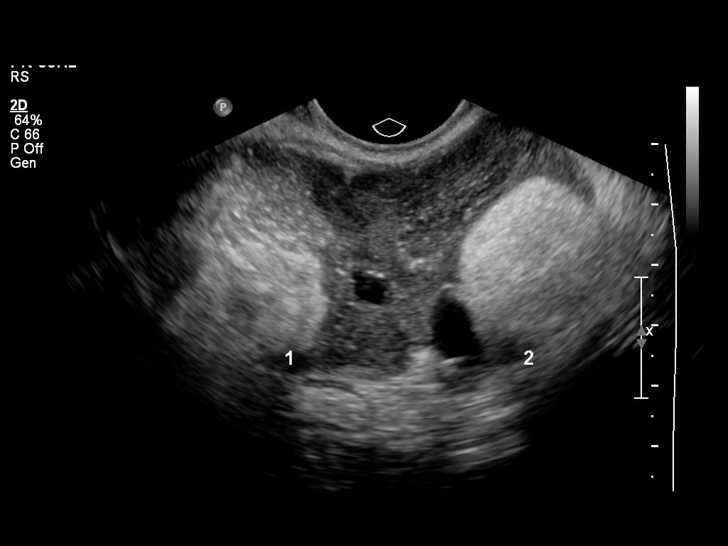
[im 34/37]
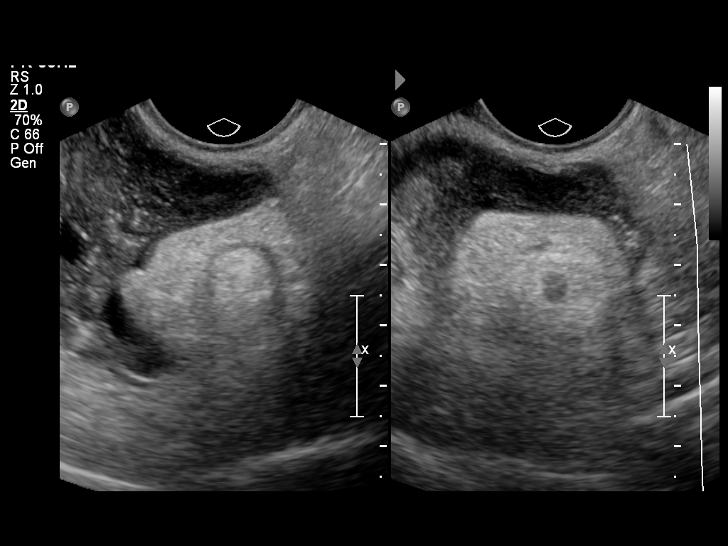
[im 37/37]
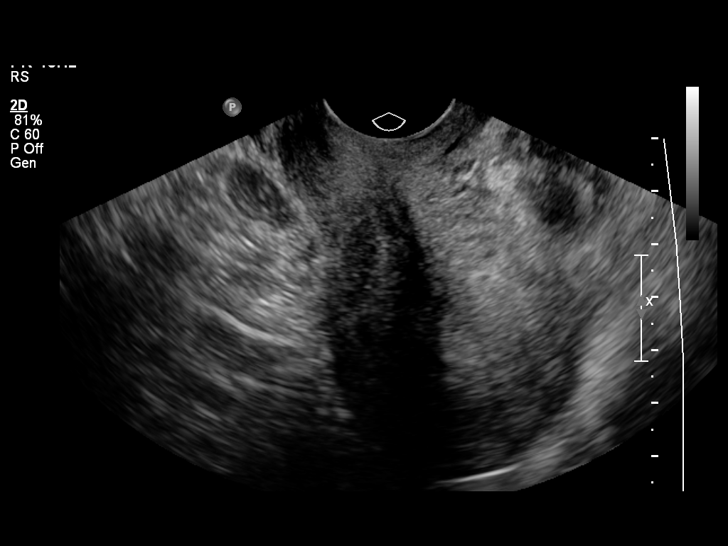

[14 of 25 positions shown; findings below may reference images not displayed]

FINDINGS: Uterus

Measurements: 8.0 x 4.8 x 5.5 cm. Retroverted and normal in
appearance.

Endometrium

Thickness: 6.5 mm.  No focal abnormality visualized.

Right ovary

Measurements: 3.9 x 2.5 x 2.5 cm. Small corpus luteum is 1.8 cm.
Color flow identified in the right ovary.

Left ovary

Measurements: 8.4 x 4.2 x 6.2 cm. Echogenic masses are 3.0 x 3.6 x
4.0 cm and 3.3 x 3.2 x 3.0 cm, consistent with ovarian dermoid.
Color flow identified within the left ovary.

Other findings:  No free pelvic fluid identified.
IMPRESSION: 1. Normal appearance of retroverted uterus.
2. Normal appearance of right ovary.
3. Persistent enlarged left ovary, containing echogenic/fatty masses
consistent with ovarian dermoid.

## 2017-11-27 ENCOUNTER — Encounter (HOSPITAL_COMMUNITY): Payer: Self-pay | Admitting: Emergency Medicine

## 2017-11-27 ENCOUNTER — Ambulatory Visit (HOSPITAL_COMMUNITY)
Admission: EM | Admit: 2017-11-27 | Discharge: 2017-11-27 | Disposition: A | Discharge status: Home / Self Care | Payer: BLUE CROSS/BLUE SHIELD | Attending: Family Medicine | Admitting: Family Medicine

## 2017-11-27 DIAGNOSIS — R69 Illness, unspecified: Secondary | ICD-10-CM

## 2017-11-27 DIAGNOSIS — R059 Cough, unspecified: Secondary | ICD-10-CM

## 2017-11-27 DIAGNOSIS — R062 Wheezing: Secondary | ICD-10-CM

## 2017-11-27 DIAGNOSIS — R05 Cough: Secondary | ICD-10-CM

## 2017-11-27 DIAGNOSIS — J111 Influenza due to unidentified influenza virus with other respiratory manifestations: Secondary | ICD-10-CM

## 2017-11-27 MED ORDER — ALBUTEROL SULFATE HFA 108 (90 BASE) MCG/ACT IN AERS: 2.0000 | INHALATION_SPRAY | RESPIRATORY_TRACT | 1 refills | Status: DC | PRN

## 2017-11-27 MED ORDER — HYDROCODONE-HOMATROPINE 5-1.5 MG/5ML PO SYRP: 5.0000 mL | ORAL_SOLUTION | Freq: Four times a day (QID) | ORAL | 0 refills | Status: DC | PRN

## 2017-11-27 MED ORDER — PREDNISONE 10 MG (21) PO TBPK: ORAL_TABLET | ORAL | 0 refills | Status: DC

## 2017-11-27 NOTE — ED Provider Notes (Signed)
[place] [facility]   [id number] 11/27/17 Arrival Time: 8413  ASSESSMENT & PLAN:  1. Influenza-like illness   2. Cough   3. Wheezing     Meds ordered this encounter  Medications  . predniSONE (STERAPRED UNI-PAK 21 TAB) 10 MG (21) TBPK tablet    Sig: Take as directed.    Dispense:  21 tablet    Refill:  0  . HYDROcodone-homatropine (HYCODAN) 5-1.5 MG/5ML syrup    Sig: Take 5 mLs by mouth every 6 (six) hours as needed for cough.    Dispense:  90 mL    Refill:  0  . albuterol (PROVENTIL HFA;VENTOLIN HFA) 108 (90 Base) MCG/ACT inhaler    Sig: Inhale 2 puffs into the lungs every 4 (four) hours as needed for wheezing or shortness of breath.    Dispense:  1 Inhaler    Refill:  1   Cough medication sedation precautions. Discussed typical duration of symptoms. OTC symptom care as needed. Ensure adequate fluid intake and rest. May f/u with PCP or here as needed.  Reviewed expectations re: course of current medical issues. Questions answered. Outlined signs and symptoms indicating need for more acute intervention. Patient verbalized understanding. After Visit Summary given.   SUBJECTIVE: History from: patient.  Sara Booth is a 42 y.o. female who presents with complaint of nasal congestion, post-nasal drainage, and a persistent dry cough. Onset abrupt, approximately 4-5 days ago. Overall fatigued with body aches. SOB: none. Wheezing: mild to moderate; uses albuterol inhaler with some relief; temporary. Fever: yes, at onset of illness; none for the past 2 days. Overall normal PO intake without n/v. Sick contacts: yes. OTC treatment: Theraflu with mild help.  Received flu shot this year: no.  Social History   Tobacco Use  Smoking Status Current Every Day Smoker  . Packs/day: 0.00  . Types: Cigarettes  . Last attempt to quit: 04/02/2015  . Years since quitting: 2.6  Smokeless Tobacco Never Used    ROS: As per HPI.   OBJECTIVE:  Vitals:   11/27/17 1110  BP: 107/60   Pulse: 76  Resp: 20  Temp: 98.4 F (36.9 C)  TempSrc: Oral  SpO2: 98%     General appearance: alert; appears fatigued HEENT: nasal congestion; clear runny nose; throat irritation secondary to post-nasal drainage Neck: supple without LAD Lungs: unlabored respirations, symmetrical air entry with mild exp wheezing; cough: moderate; no respiratory distress Skin: warm and dry Psychological: alert and cooperative; normal mood and affect   Allergies  Allergen Reactions  . Amoxicillin Other (See Comments)    Made her very hot and her lips turned blue. She also had frequent urination.  . Erythromycin     Was told not to take it in the hospital  . Penicillins Other (See Comments)    She can take any cillins    Correction  Pt can  Not take  cillens per pt  . Latex Rash    Past Medical History:  Diagnosis Date  . Anxiety   . Asthma    Family History  Problem Relation Age of Onset  . Depression Mother   . Heart disease Father   . Cancer Maternal Grandmother        breast   Social History   Socioeconomic History  . Marital status: Married    Spouse name: Not on file  . Number of children: Not on file  . Years of education: Not on file  . Highest education level: Not on file  Social Needs  . Financial resource strain: Not on file  . Food insecurity - worry: Not on file  . Food insecurity - inability: Not on file  . Transportation needs - medical: Not on file  . Transportation needs - non-medical: Not on file  Occupational History  . Not on file  Tobacco Use  . Smoking status: Current Every Day Smoker    Packs/day: 0.00    Types: Cigarettes    Last attempt to quit: 04/02/2015    Years since quitting: 2.6  . Smokeless tobacco: Never Used  Substance and Sexual Activity  . Alcohol use: No    Alcohol/week: 0.0 oz  . Drug use: No    Comment: uses E-cigs without the nicotine  . Sexual activity: Not Currently  Other Topics Concern  . Not on file  Social History  Narrative  . Not on file           Vanessa Kick, MD 11/27/17 1154

## 2017-11-27 NOTE — Discharge Instructions (Signed)

## 2017-11-27 NOTE — ED Triage Notes (Signed)
PT C/O:  Cold sx onset 1 week associated w/cough, headache, nasal drainage/congestion, fevers  Also needing refill on rescue inhaler.   TAKING MEDS: Theraflu   A&O x4... NAD... Ambulatory

## 2018-03-05 ENCOUNTER — Encounter (HOSPITAL_COMMUNITY): Payer: Self-pay | Admitting: Emergency Medicine

## 2018-03-05 ENCOUNTER — Ambulatory Visit (HOSPITAL_COMMUNITY)
Admission: EM | Admit: 2018-03-05 | Discharge: 2018-03-05 | Disposition: A | Discharge status: Home / Self Care | Payer: BLUE CROSS/BLUE SHIELD | Attending: Urgent Care | Admitting: Urgent Care

## 2018-03-05 ENCOUNTER — Other Ambulatory Visit: Payer: Self-pay

## 2018-03-05 DIAGNOSIS — I890 Lymphedema, not elsewhere classified: Secondary | ICD-10-CM

## 2018-03-05 DIAGNOSIS — M7989 Other specified soft tissue disorders: Secondary | ICD-10-CM

## 2018-03-05 DIAGNOSIS — F17210 Nicotine dependence, cigarettes, uncomplicated: Secondary | ICD-10-CM

## 2018-03-05 DIAGNOSIS — R634 Abnormal weight loss: Secondary | ICD-10-CM

## 2018-03-05 DIAGNOSIS — R233 Spontaneous ecchymoses: Secondary | ICD-10-CM

## 2018-03-05 LAB — POCT I-STAT, CHEM 8
BUN: 4 mg/dL — AB (ref 6–20)
CREATININE: 0.6 mg/dL (ref 0.44–1.00)
Calcium, Ion: 1.2 mmol/L (ref 1.15–1.40)
Chloride: 103 mmol/L (ref 101–111)
Glucose, Bld: 84 mg/dL (ref 65–99)
HEMATOCRIT: 43 % (ref 36.0–46.0)
Hemoglobin: 14.6 g/dL (ref 12.0–15.0)
POTASSIUM: 4.1 mmol/L (ref 3.5–5.1)
Sodium: 142 mmol/L (ref 135–145)
TCO2: 28 mmol/L (ref 22–32)

## 2018-03-05 MED ORDER — SULFAMETHOXAZOLE-TRIMETHOPRIM 800-160 MG PO TABS: 1.0000 | ORAL_TABLET | Freq: Two times a day (BID) | ORAL | 0 refills | Status: DC

## 2018-03-05 MED ORDER — FUROSEMIDE 20 MG PO TABS: 20.0000 mg | ORAL_TABLET | Freq: Every day | ORAL | 0 refills | Status: DC

## 2018-03-05 NOTE — Discharge Instructions (Addendum)
Hydrate well with at least 2 liters (1 gallon) of water daily. Eat more foods that contain a lot of potassium, such as: Nuts, such as peanuts and pistachios. Seeds, such as sunflower seeds and pumpkin seeds. Peas, lentils, and lima beans. Whole grain and bran cereals and breads. Fresh fruits and vegetables, such as apricots, avocado, bananas, cantaloupe, kiwi, oranges, tomatoes, asparagus, and potatoes. Orange juice. Tomato juice. Red meats. Yogurt.  Please make sure you set up follow up with a PCP that can do further work up of your lymphedema. I am concerned that you have a serious medical condition. Please try to establish care at the [facility].

## 2018-03-05 NOTE — ED Provider Notes (Signed)
MRN: [id number] DOB: 01/01/1976  Subjective:   Sara Booth is a 42 y.o. female presenting for several year history of recurrent lower leg rash, swelling. States that her rash occurs randomly since 2015, has redness, warmth, some itching and pain. Usually resolves on its own in 2 days without any intervention. Today, she reports that she has had an upset stomach, nausea without vomiting, malaise. Reports that she had an abdominal wall abscess requiring surgical incision and drainage in 06/2015 but the rash was occurring prior to this. This was a post-operative infection from laparoscopic removal of an ovarian cyst. She has not gotten consistent healthcare since then. Smokes ~1ppd, has been smoking since she was 42 years old. Of note, reports ~60lb weight loss since 07/2017. She did have a divorce/separation that month as well.   No current facility-administered medications for this encounter.   Current Outpatient Medications:  .  cetirizine (ZYRTEC) 10 MG tablet, Take 10 mg by mouth daily., Disp: , Rfl:  .  fluticasone (FLONASE) 50 MCG/ACT nasal spray, Place 1 spray into both nostrils daily., Disp: , Rfl:  .  acetaminophen (TYLENOL) 500 MG tablet, Take 500 mg by mouth every 6 (six) hours as needed., Disp: , Rfl:  .  albuterol (PROVENTIL HFA;VENTOLIN HFA) 108 (90 Base) MCG/ACT inhaler, Inhale 2 puffs into the lungs every 4 (four) hours as needed for wheezing or shortness of breath., Disp: 1 Inhaler, Rfl: 1 .  cephALEXin (KEFLEX) 500 MG capsule, Take 1 capsule (500 mg total) by mouth 4 (four) times daily., Disp: 20 capsule, Rfl: 0 .  HYDROcodone-homatropine (HYCODAN) 5-1.5 MG/5ML syrup, Take 5 mLs by mouth every 6 (six) hours as needed for cough., Disp: 90 mL, Rfl: 0 .  ibuprofen (ADVIL,MOTRIN) 600 MG tablet, Take 1 tablet (600 mg total) by mouth every 6 (six) hours as needed for moderate pain., Disp: 30 tablet, Rfl: 2 .  loratadine (CLARITIN) 10 MG tablet, Take 1 tablet (10 mg total) by mouth daily.,  Disp: 30 tablet, Rfl: 0 .  metroNIDAZOLE (METROGEL) 1 % gel, Apply topically daily., Disp: 60 g, Rfl: 1 .  ondansetron (ZOFRAN ODT) 4 MG disintegrating tablet, 4mg  ODT q6 hours prn nausea/vomit, Disp: 8 tablet, Rfl: 0 .  predniSONE (STERAPRED UNI-PAK 21 TAB) 10 MG (21) TBPK tablet, Take as directed., Disp: 21 tablet, Rfl: 0   Allergies  Allergen Reactions  . Amoxicillin Other (See Comments)    Made her very hot and her lips turned blue. She also had frequent urination.  . Erythromycin     Was told not to take it in the hospital  . Penicillins Other (See Comments)    She can take any cillins    Correction  Pt can  Not take  cillens per pt  . Latex Rash    Past Medical History:  Diagnosis Date  . Anxiety   . Asthma      Past Surgical History:  Procedure Laterality Date  . CHOLECYSTECTOMY    . INCISION AND DRAINAGE ABSCESS N/A 07/14/2015   Procedure: INCISION AND DRAINAGE UMBILICAL ABSCESS;  Surgeon: Guss Bunde, MD;  Location: [facility];  Service: Gynecology;  Laterality: N/A;  . LAPAROSCOPIC UNILATERAL SALPINGO OOPHERECTOMY Left 06/28/2015   Procedure: LAPAROSCOPIC LEFT SALPINGO OOPHORECTOMY;  Surgeon: Mora Bellman, MD;  Location: [facility];  Service: Gynecology;  Laterality: Left;  . TUBAL LIGATION      Objective:   Vitals: BP 126/67 (BP Location: Left Arm)   Pulse 61   Temp 98.1 F (  36.7 C) (Oral)   Resp 18   LMP 03/05/2018   SpO2 99%   Wt Readings from Last 3 Encounters:  04/14/16 230 lb (104.3 kg)  12/18/15 246 lb 3.2 oz (111.7 kg)  10/06/15 237 lb (107.5 kg)    Physical Exam  Constitutional: She is oriented to person, place, and time. She appears well-developed and well-nourished.  HENT:  Mouth/Throat: Oropharynx is clear and moist.  Eyes: Right eye exhibits no discharge. Left eye exhibits no discharge. No scleral icterus.  Neck: Normal range of motion. Neck supple.  Cardiovascular: Normal rate, regular rhythm and intact distal pulses. Exam reveals no gallop and  no friction rub.  No murmur heard. Pulmonary/Chest: No respiratory distress. She has no wheezes. She has no rales.  Abdominal: Soft. Bowel sounds are normal. She exhibits no distension and no mass. There is tenderness (mild, lower over surgical scar from 2016). There is no rebound and no guarding.  Lymphadenopathy:    She has no cervical adenopathy.  Neurological: She is alert and oriented to person, place, and time.  Skin: Skin is warm and dry.  Psychiatric: She has a normal mood and affect.   Results for orders placed or performed during the hospital encounter of 03/05/18 (from the past 24 hour(s))  I-STAT, chem 8     Status: Abnormal   Collection Time: 03/05/18 12:41 PM  Result Value Ref Range   Sodium 142 135 - 145 mmol/L   Potassium 4.1 3.5 - 5.1 mmol/L   Chloride 103 101 - 111 mmol/L   BUN 4 (L) 6 - 20 mg/dL   Creatinine, Ser 0.60 0.44 - 1.00 mg/dL   Glucose, Bld 84 65 - 99 mg/dL   Calcium, Ion 1.20 1.15 - 1.40 mmol/L   TCO2 28 22 - 32 mmol/L   Hemoglobin 14.6 12.0 - 15.0 g/dL   HCT 43.0 36.0 - 46.0 %     Assessment and Plan :   Lymphedema  Swelling of lower leg  Petechiae  Smoking greater than 25 pack years  Loss of weight  Will manage her for cellulitis with Bactrim, Lasix for her lymphedema. Patient is to establish care with a PCP, added her to work queue. Counseled that given her history of smoking I cannot rule out an insidious process and needs further work up. Patient is in agreement. Counseled patient on potential for adverse effects with medications prescribed today, patient verbalized understanding. Return-to-clinic precautions discussed, patient verbalized understanding.      Jaynee Eagles, PA-C 03/05/18 1246

## 2018-03-05 NOTE — ED Triage Notes (Signed)
Rash to bilateral lower legs for two weeks.  This is a recurrent rash.  Patient also complains of lower extremity swelling.  Left leg worse than right.  Patient reports a burning sensation in lower legs.

## 2018-08-05 ENCOUNTER — Other Ambulatory Visit: Payer: Self-pay

## 2018-08-05 ENCOUNTER — Encounter (HOSPITAL_COMMUNITY): Payer: Self-pay | Admitting: *Deleted

## 2018-08-05 ENCOUNTER — Ambulatory Visit (HOSPITAL_COMMUNITY)
Admission: EM | Admit: 2018-08-05 | Discharge: 2018-08-05 | Disposition: A | Discharge status: Home / Self Care | Payer: BLUE CROSS/BLUE SHIELD | Attending: Family Medicine | Admitting: Family Medicine

## 2018-08-05 DIAGNOSIS — K047 Periapical abscess without sinus: Secondary | ICD-10-CM

## 2018-08-05 MED ORDER — CLINDAMYCIN HCL 150 MG PO CAPS: 150.0000 mg | ORAL_CAPSULE | Freq: Four times a day (QID) | ORAL | 0 refills | Status: DC

## 2018-08-05 MED ORDER — NAPROXEN 500 MG PO TABS: 500.0000 mg | ORAL_TABLET | Freq: Two times a day (BID) | ORAL | 0 refills | Status: DC

## 2018-08-05 NOTE — ED Triage Notes (Signed)
C/o lower jaw pain onset this am

## 2018-08-05 NOTE — ED Provider Notes (Signed)
[facility]    CSN: [id number] Arrival date & time: 08/05/18  1224     History   Chief Complaint Chief Complaint  Patient presents with  . Dental Pain    HPI Sara Booth is a 42 y.o. female.    Dental Pain  Location:  Lower Quality:  Aching, dull and constant Severity:  Moderate Onset quality:  Gradual Duration:  1 week Timing:  Constant Progression:  Worsening Chronicity:  New Context: abscess and dental caries   Previous work-up:  Dental exam Relieved by:  NSAIDs Worsened by:  Cold food/drink, touching, jaw movement, pressure and hot food/drink Associated symptoms: facial pain, facial swelling and gum swelling   Associated symptoms: no congestion, no difficulty swallowing, no drooling, no fever, no headaches, no neck pain, no neck swelling, no oral bleeding, no oral lesions and no trismus   Risk factors: lack of dental care, periodontal disease and smoking     Past Medical History:  Diagnosis Date  . Anxiety   . Asthma     Patient Active Problem List   Diagnosis Date Noted  . Rosacea 09/06/2015  . Abscess, umbilical 47/42/5956  . Post op infection 07/15/2015  . Subcutaneous abscess 07/14/2015  . Dermoid cyst of left ovary   . Pelvic adhesions   . Amenorrhea 03/23/2013  . Bipolar disorder ([place]) 03/03/2012  . Sinus infection 12/20/2011  . Callus of foot 01/12/2011  . PAP SMER CERV W/HI GRADE SQUAMOUS INTRAEPITH LES 10/19/2010  . IBS 07/09/2008  . IRREGULAR MENSTRUAL CYCLE 09/19/2007  . ANXIETY STATE NOS 01/10/2007  . OBESITY, NOS 01/02/2007  . TOBACCO DEPENDENCE 01/02/2007  . RHINITIS, ALLERGIC 01/02/2007  . ASTHMA, PERSISTENT 01/02/2007  . CERVICAL DYSPLASIA 01/02/2007    Past Surgical History:  Procedure Laterality Date  . CHOLECYSTECTOMY    . INCISION AND DRAINAGE ABSCESS N/A 07/14/2015   Procedure: INCISION AND DRAINAGE UMBILICAL ABSCESS;  Surgeon: Guss Bunde, MD;  Location: [facility];  Service: Gynecology;  Laterality: N/A;  .  LAPAROSCOPIC UNILATERAL SALPINGO OOPHERECTOMY Left 06/28/2015   Procedure: LAPAROSCOPIC LEFT SALPINGO OOPHORECTOMY;  Surgeon: Mora Bellman, MD;  Location: [facility];  Service: Gynecology;  Laterality: Left;  . TUBAL LIGATION      OB History    Gravida  5   Para  4   Term  4   Preterm      AB  1   Living  4     SAB  1   TAB      Ectopic      Multiple      Live Births               Home Medications    Prior to Admission medications   Medication Sig Start Date End Date Taking? Authorizing Provider  acetaminophen (TYLENOL) 500 MG tablet Take 500 mg by mouth every 6 (six) hours as needed.    [provider]  albuterol (PROVENTIL HFA;VENTOLIN HFA) 108 (90 Base) MCG/ACT inhaler Inhale 2 puffs into the lungs every 4 (four) hours as needed for wheezing or shortness of breath. 11/27/17   Vanessa Kick, MD  cetirizine (ZYRTEC) 10 MG tablet Take 10 mg by mouth daily.    [provider]  clindamycin (CLEOCIN) 150 MG capsule Take 1 capsule (150 mg total) by mouth every 6 (six) hours. 08/05/18   Finlee Concepcion, Tressia Miners A, NP  fluticasone (FLONASE) 50 MCG/ACT nasal spray Place 1 spray into both nostrils daily.    [provider]  furosemide (LASIX) 20 MG tablet Take 1 tablet (20 mg total) by mouth daily. 03/05/18   Jaynee Eagles, PA-C  ibuprofen (ADVIL,MOTRIN) 600 MG tablet Take 1 tablet (600 mg total) by mouth every 6 (six) hours as needed for moderate pain. 07/18/15   Truett Mainland, DO  loratadine (CLARITIN) 10 MG tablet Take 1 tablet (10 mg total) by mouth daily. 12/18/15   Muthersbaugh, Jarrett Soho, PA-C  naproxen (NAPROSYN) 500 MG tablet Take 1 tablet (500 mg total) by mouth 2 (two) times daily. 08/05/18   Loura Halt A, NP  ondansetron (ZOFRAN ODT) 4 MG disintegrating tablet 4mg  ODT q6 hours prn nausea/vomit 11/12/16   Etta Quill, NP  sulfamethoxazole-trimethoprim (BACTRIM DS,SEPTRA DS) 800-160 MG tablet Take 1 tablet by mouth 2 (two) times daily. 03/05/18   Jaynee Eagles, PA-C      Family History Family History  Problem Relation Age of Onset  . Depression Mother   . Heart disease Father   . Cancer Maternal Grandmother        breast    Social History Social History   Tobacco Use  . Smoking status: Current Every Day Smoker    Packs/day: 0.00    Types: Cigarettes    Last attempt to quit: 04/02/2015    Years since quitting: 3.3  . Smokeless tobacco: Never Used  Substance Use Topics  . Alcohol use: No    Alcohol/week: 0.0 standard drinks  . Drug use: No    Comment: uses E-cigs without the nicotine     Allergies   Amoxicillin; Erythromycin; Penicillins; and Latex   Review of Systems Review of Systems  Constitutional: Negative for fever.  HENT: Positive for facial swelling. Negative for congestion, drooling and mouth sores.   Musculoskeletal: Negative for neck pain.  Neurological: Negative for headaches.     Physical Exam Triage Vital Signs ED Triage Vitals  Enc Vitals Group     BP 08/05/18 1252 128/75     Pulse Rate 08/05/18 1252 67     Resp 08/05/18 1252 16     Temp 08/05/18 1252 98.2 F (36.8 C)     Temp Source 08/05/18 1252 Oral     SpO2 08/05/18 1252 95 %     Weight --      Height --      Head Circumference --      Peak Flow --      Pain Score 08/05/18 1254 6     Pain Loc --      Pain Edu? --      Excl. in [place]? --    No data found.  Updated Vital Signs BP 128/75 (BP Location: Left Arm)   Pulse 67   Temp 98.2 F (36.8 C) (Oral)   Resp 16   LMP 08/02/2018 (Exact Date)   SpO2 95%   Visual Acuity Right Eye Distance:   Left Eye Distance:   Bilateral Distance:    Right Eye Near:   Left Eye Near:    Bilateral Near:     Physical Exam  Constitutional: She appears well-developed and well-nourished.  Very pleasant. Non toxic or ill appearing.     HENT:  Head: Normocephalic and atraumatic.  Mouth/Throat:    Right lower gum swelling with erythema.  Tender to palpation.  Some bloody and white drainage.  Swelling to  right lower face/jaw area.  No trismus  Nursing note and vitals reviewed.    UC Treatments / Results  Labs (all labs ordered are listed, but only  abnormal results are displayed) Labs Reviewed - No data to display  EKG None  Radiology No results found.  Procedures Procedures (including critical care time)  Medications Ordered in UC Medications - No data to display  Initial Impression / Assessment and Plan / UC Course  I have reviewed the triage vital signs and the nursing notes.  Pertinent labs & imaging results that were available during my care of the patient were reviewed by me and considered in my medical decision making (see chart for details).     We will go ahead and treat for dental infection with clindamycin due to patient's history of penicillin allergy Naproxen for pain Dental resources given Follow-up with dentist for further evaluation and treatment Final Clinical Impressions(s) / UC Diagnoses   Final diagnoses:  Dental infection     Discharge Instructions     It was nice meeting you!!  We will treat you for the dental infection with clindamycin Naproxen for pain and inflammation.  Follow up with dentist.     ED Prescriptions    Medication Sig Dispense Auth. Provider   clindamycin (CLEOCIN) 150 MG capsule Take 1 capsule (150 mg total) by mouth every 6 (six) hours. 28 capsule Kostas Marrow A, NP   naproxen (NAPROSYN) 500 MG tablet Take 1 tablet (500 mg total) by mouth 2 (two) times daily. 30 tablet Loura Halt A, NP     Controlled Substance Prescriptions [place] Controlled Substance Registry consulted? Not Applicable   Orvan July, NP 08/05/18 1335

## 2018-08-05 NOTE — Discharge Instructions (Addendum)
It was nice meeting you!!  We will treat you for the dental infection with clindamycin Naproxen for pain and inflammation.  Follow up with dentist.

## 2018-09-10 ENCOUNTER — Encounter (HOSPITAL_COMMUNITY): Payer: Self-pay

## 2018-09-10 ENCOUNTER — Ambulatory Visit (HOSPITAL_COMMUNITY)
Admission: EM | Admit: 2018-09-10 | Discharge: 2018-09-10 | Disposition: A | Discharge status: Home / Self Care | Payer: BLUE CROSS/BLUE SHIELD | Attending: Family Medicine | Admitting: Family Medicine

## 2018-09-10 DIAGNOSIS — R300 Dysuria: Secondary | ICD-10-CM | POA: Insufficient documentation

## 2018-09-10 DIAGNOSIS — Z9049 Acquired absence of other specified parts of digestive tract: Secondary | ICD-10-CM | POA: Insufficient documentation

## 2018-09-10 DIAGNOSIS — Z881 Allergy status to other antibiotic agents status: Secondary | ICD-10-CM | POA: Diagnosis not present

## 2018-09-10 DIAGNOSIS — F17210 Nicotine dependence, cigarettes, uncomplicated: Secondary | ICD-10-CM | POA: Diagnosis not present

## 2018-09-10 DIAGNOSIS — R350 Frequency of micturition: Secondary | ICD-10-CM | POA: Diagnosis not present

## 2018-09-10 DIAGNOSIS — Z7951 Long term (current) use of inhaled steroids: Secondary | ICD-10-CM | POA: Diagnosis not present

## 2018-09-10 DIAGNOSIS — Z9851 Tubal ligation status: Secondary | ICD-10-CM | POA: Insufficient documentation

## 2018-09-10 DIAGNOSIS — Z8249 Family history of ischemic heart disease and other diseases of the circulatory system: Secondary | ICD-10-CM | POA: Insufficient documentation

## 2018-09-10 DIAGNOSIS — Z90721 Acquired absence of ovaries, unilateral: Secondary | ICD-10-CM | POA: Diagnosis not present

## 2018-09-10 DIAGNOSIS — J45909 Unspecified asthma, uncomplicated: Secondary | ICD-10-CM | POA: Insufficient documentation

## 2018-09-10 DIAGNOSIS — Z79899 Other long term (current) drug therapy: Secondary | ICD-10-CM | POA: Diagnosis not present

## 2018-09-10 DIAGNOSIS — Z880 Allergy status to penicillin: Secondary | ICD-10-CM | POA: Diagnosis not present

## 2018-09-10 DIAGNOSIS — N898 Other specified noninflammatory disorders of vagina: Secondary | ICD-10-CM | POA: Diagnosis not present

## 2018-09-10 DIAGNOSIS — Z818 Family history of other mental and behavioral disorders: Secondary | ICD-10-CM | POA: Diagnosis not present

## 2018-09-10 DIAGNOSIS — Z91040 Latex allergy status: Secondary | ICD-10-CM | POA: Diagnosis not present

## 2018-09-10 DIAGNOSIS — Z3202 Encounter for pregnancy test, result negative: Secondary | ICD-10-CM

## 2018-09-10 LAB — POCT URINALYSIS DIP (DEVICE)
Bilirubin Urine: NEGATIVE
Glucose, UA: NEGATIVE mg/dL
KETONES UR: NEGATIVE mg/dL
Nitrite: NEGATIVE
Protein, ur: 100 mg/dL — AB
Specific Gravity, Urine: 1.02 (ref 1.005–1.030)
Urobilinogen, UA: 0.2 mg/dL (ref 0.0–1.0)
pH: 7.5 (ref 5.0–8.0)

## 2018-09-10 MED ORDER — NITROFURANTOIN MONOHYD MACRO 100 MG PO CAPS: 100.0000 mg | ORAL_CAPSULE | Freq: Two times a day (BID) | ORAL | 0 refills | Status: DC

## 2018-09-10 NOTE — ED Triage Notes (Signed)
Pt presents with urinary tract symptoms; frequent urination, discomfort and color change

## 2018-09-10 NOTE — Discharge Instructions (Signed)
Urine showed evidence of infection. We are treating you with macrobid. Be sure to take full course. Stay hydrated- urine should be pale yellow to clear. My continue azo for relief of burning while infection is being cleared.  ° °Please return or follow up with your primary provider if symptoms not improving with treatment. Please return sooner if you have worsening of symptoms or develop fever, nausea, vomiting, abdominal pain, back pain, lightheadedness, dizziness. °

## 2018-09-11 LAB — POCT PREGNANCY, URINE: PREG TEST UR: NEGATIVE

## 2018-09-11 NOTE — ED Provider Notes (Signed)
[facility]    CSN: [id number] Arrival date & time: 09/10/18  1441     History   Chief Complaint Chief Complaint  Patient presents with  . Urinary Tract Infection    HPI Sara Booth is a 42 y.o. female history of asthma presenting today for evaluation of possible UTI.  Patient states that over the past week she has developed increased dysuria, pressure, and frequent urination.  She has also noticed the color of the urine change.  She denies any itching.  Has noted a small amount of clear vaginal discharge.  Last menstrual period was 10/25 to 11/2 5 has had some small amount of spotting over the past few days.  She has a history of tubal ligation and removal of left ovary and fallopian tube due to cysts.  HPI  Past Medical History:  Diagnosis Date  . Anxiety   . Asthma     Patient Active Problem List   Diagnosis Date Noted  . Rosacea 09/06/2015  . Abscess, umbilical 37/16/9678  . Post op infection 07/15/2015  . Subcutaneous abscess 07/14/2015  . Dermoid cyst of left ovary   . Pelvic adhesions   . Amenorrhea 03/23/2013  . Bipolar disorder ([place]) 03/03/2012  . Sinus infection 12/20/2011  . Callus of foot 01/12/2011  . PAP SMER CERV W/HI GRADE SQUAMOUS INTRAEPITH LES 10/19/2010  . IBS 07/09/2008  . IRREGULAR MENSTRUAL CYCLE 09/19/2007  . ANXIETY STATE NOS 01/10/2007  . OBESITY, NOS 01/02/2007  . TOBACCO DEPENDENCE 01/02/2007  . RHINITIS, ALLERGIC 01/02/2007  . ASTHMA, PERSISTENT 01/02/2007  . CERVICAL DYSPLASIA 01/02/2007    Past Surgical History:  Procedure Laterality Date  . CHOLECYSTECTOMY    . INCISION AND DRAINAGE ABSCESS N/A 07/14/2015   Procedure: INCISION AND DRAINAGE UMBILICAL ABSCESS;  Surgeon: Guss Bunde, MD;  Location: [facility];  Service: Gynecology;  Laterality: N/A;  . LAPAROSCOPIC UNILATERAL SALPINGO OOPHERECTOMY Left 06/28/2015   Procedure: LAPAROSCOPIC LEFT SALPINGO OOPHORECTOMY;  Surgeon: Mora Bellman, MD;  Location: [facility];  Service:  Gynecology;  Laterality: Left;  . TUBAL LIGATION      OB History    Gravida  5   Para  4   Term  4   Preterm      AB  1   Living  4     SAB  1   TAB      Ectopic      Multiple      Live Births               Home Medications    Prior to Admission medications   Medication Sig Start Date End Date Taking? Authorizing Provider  acetaminophen (TYLENOL) 500 MG tablet Take 500 mg by mouth every 6 (six) hours as needed.    [provider]  albuterol (PROVENTIL HFA;VENTOLIN HFA) 108 (90 Base) MCG/ACT inhaler Inhale 2 puffs into the lungs every 4 (four) hours as needed for wheezing or shortness of breath. 11/27/17   Vanessa Kick, MD  cetirizine (ZYRTEC) 10 MG tablet Take 10 mg by mouth daily.    [provider]  clindamycin (CLEOCIN) 150 MG capsule Take 1 capsule (150 mg total) by mouth every 6 (six) hours. 08/05/18   Bast, Tressia Miners A, NP  fluticasone (FLONASE) 50 MCG/ACT nasal spray Place 1 spray into both nostrils daily.    [provider]  furosemide (LASIX) 20 MG tablet Take 1 tablet (20 mg total) by mouth daily. 03/05/18   Jaynee Eagles, PA-C  ibuprofen (ADVIL,MOTRIN) 600 MG tablet Take 1 tablet (600 mg total) by mouth every 6 (six) hours as needed for moderate pain. 07/18/15   Truett Mainland, DO  loratadine (CLARITIN) 10 MG tablet Take 1 tablet (10 mg total) by mouth daily. 12/18/15   Muthersbaugh, Jarrett Soho, PA-C  naproxen (NAPROSYN) 500 MG tablet Take 1 tablet (500 mg total) by mouth 2 (two) times daily. 08/05/18   Loura Halt A, NP  nitrofurantoin, macrocrystal-monohydrate, (MACROBID) 100 MG capsule Take 1 capsule (100 mg total) by mouth 2 (two) times daily. 09/10/18   Madelina Sanda C, PA-C  ondansetron (ZOFRAN ODT) 4 MG disintegrating tablet 4mg  ODT q6 hours prn nausea/vomit 11/12/16   Etta Quill, NP  sulfamethoxazole-trimethoprim (BACTRIM DS,SEPTRA DS) 800-160 MG tablet Take 1 tablet by mouth 2 (two) times daily. 03/05/18   Jaynee Eagles, PA-C    Family  History Family History  Problem Relation Age of Onset  . Depression Mother   . Heart disease Father   . Cancer Maternal Grandmother        breast    Social History Social History   Tobacco Use  . Smoking status: Current Every Day Smoker    Packs/day: 0.00    Types: Cigarettes    Last attempt to quit: 04/02/2015    Years since quitting: 3.4  . Smokeless tobacco: Never Used  Substance Use Topics  . Alcohol use: No    Alcohol/week: 0.0 standard drinks  . Drug use: No    Comment: uses E-cigs without the nicotine     Allergies   Amoxicillin; Erythromycin; Penicillins; and Latex   Review of Systems Review of Systems  Constitutional: Negative for fever.  Respiratory: Negative for shortness of breath.   Cardiovascular: Negative for chest pain.  Gastrointestinal: Negative for abdominal pain, diarrhea, nausea and vomiting.  Genitourinary: Positive for dysuria, frequency and vaginal discharge. Negative for flank pain, genital sores, hematuria, menstrual problem, vaginal bleeding and vaginal pain.  Musculoskeletal: Negative for back pain.  Skin: Negative for rash.  Neurological: Negative for dizziness, light-headedness and headaches.     Physical Exam Triage Vital Signs ED Triage Vitals  Enc Vitals Group     BP 09/10/18 1623 117/63     Pulse Rate 09/10/18 1623 63     Resp 09/10/18 1623 20     Temp 09/10/18 1623 97.9 F (36.6 C)     Temp Source 09/10/18 1623 Oral     SpO2 09/10/18 1623 100 %     Weight --      Height --      Head Circumference --      Peak Flow --      Pain Score 09/10/18 1625 4     Pain Loc --      Pain Edu? --      Excl. in [place]? --    No data found.  Updated Vital Signs BP 117/63 (BP Location: Left Arm)   Pulse 63   Temp 97.9 F (36.6 C) (Oral)   Resp 20   LMP 08/29/2018   SpO2 100%   Visual Acuity Right Eye Distance:   Left Eye Distance:   Bilateral Distance:    Right Eye Near:   Left Eye Near:    Bilateral Near:     Physical  Exam  Constitutional: She is oriented to person, place, and time. She appears well-developed and well-nourished.  No acute distress  HENT:  Head: Normocephalic and atraumatic.  Nose: Nose normal.  Eyes: Conjunctivae are normal.  Neck: Neck supple.  Cardiovascular: Normal rate.  Pulmonary/Chest: Effort normal. No respiratory distress.  Abdominal: Soft. She exhibits no distension.  Mild discomfort to palpation of her abdomen/suprapubic area  Genitourinary:  Genitourinary Comments: Deferred  Musculoskeletal: Normal range of motion.  Neurological: She is alert and oriented to person, place, and time.  Skin: Skin is warm and dry.  Psychiatric: She has a normal mood and affect.  Nursing note and vitals reviewed.    UC Treatments / Results  Labs (all labs ordered are listed, but only abnormal results are displayed) Labs Reviewed  POCT URINALYSIS DIP (DEVICE) - Abnormal; Notable for the following components:      Result Value   Hgb urine dipstick MODERATE (*)    Protein, ur 100 (*)    Leukocytes, UA MODERATE (*)    All other components within normal limits  URINE CULTURE    EKG None  Radiology No results found.  Procedures Procedures (including critical care time)  Medications Ordered in UC Medications - No data to display  Initial Impression / Assessment and Plan / UC Course  I have reviewed the triage vital signs and the nursing notes.  Pertinent labs & imaging results that were available during my care of the patient were reviewed by me and considered in my medical decision making (see chart for details).     Moderate leukocytes, will treat for UTI with Macrobid.  Push fluids.  Continue to monitor symptoms,Discussed strict return precautions. Patient verbalized understanding and is agreeable with plan.  Final Clinical Impressions(s) / UC Diagnoses   Final diagnoses:  Dysuria     Discharge Instructions     Urine showed evidence of infection. We are treating  you with macrobid. Be sure to take full course. Stay hydrated- urine should be pale yellow to clear. My continue azo for relief of burning while infection is being cleared.   Please return or follow up with your primary provider if symptoms not improving with treatment. Please return sooner if you have worsening of symptoms or develop fever, nausea, vomiting, abdominal pain, back pain, lightheadedness, dizziness.   ED Prescriptions    Medication Sig Dispense Auth. Provider   nitrofurantoin, macrocrystal-monohydrate, (MACROBID) 100 MG capsule Take 1 capsule (100 mg total) by mouth 2 (two) times daily. 10 capsule Melynda Krzywicki C, PA-C     Controlled Substance Prescriptions [place] Controlled Substance Registry consulted? Not Applicable   Janith Lima, [place] 09/11/18 [id number]

## 2018-09-12 LAB — URINE CULTURE

## 2019-04-06 ENCOUNTER — Other Ambulatory Visit: Payer: Self-pay

## 2019-04-06 ENCOUNTER — Ambulatory Visit (HOSPITAL_COMMUNITY)
Admission: EM | Admit: 2019-04-06 | Discharge: 2019-04-06 | Disposition: A | Discharge status: Home / Self Care | Payer: BLUE CROSS/BLUE SHIELD | Attending: Family Medicine | Admitting: Family Medicine

## 2019-04-06 ENCOUNTER — Encounter (HOSPITAL_COMMUNITY): Payer: Self-pay | Admitting: Emergency Medicine

## 2019-04-06 DIAGNOSIS — J4520 Mild intermittent asthma, uncomplicated: Secondary | ICD-10-CM | POA: Diagnosis not present

## 2019-04-06 DIAGNOSIS — J320 Chronic maxillary sinusitis: Secondary | ICD-10-CM | POA: Diagnosis not present

## 2019-04-06 MED ORDER — CLINDAMYCIN HCL 150 MG PO CAPS: 150.0000 mg | ORAL_CAPSULE | Freq: Three times a day (TID) | ORAL | 0 refills | Status: DC

## 2019-04-06 MED ORDER — ALBUTEROL SULFATE HFA 108 (90 BASE) MCG/ACT IN AERS: 2.0000 | INHALATION_SPRAY | RESPIRATORY_TRACT | 11 refills | Status: DC | PRN

## 2019-04-06 NOTE — ED Provider Notes (Addendum)
[place]    CSN: [id number] Arrival date & time: 04/06/19  1331     History   Chief Complaint Chief Complaint  Patient presents with  . URI  . Allergies    HPI Sara Booth is a 43 y.o. female.   43 yo established patient at [facility]  Patient reports seasonal allergies.  Symptoms for 1 1/2 months.  Patient has had sneezing, runny nose, cough, intermittent ear pain and occasional headaches.  Patient reports the weather impacts her symptoms, rain makes pain significantly worse.  She has had recurrent sinus infections and considered adenoid surgery.  She has multiple allergies.    Her regimen lately has included Zyrtec and Flonase without improvement x 3 weeks.  No fever, lost sense of smell, GI symptoms, dyspnea.  The cough was a little worse today and she has run out of her inhaler.     Past Medical History:  Diagnosis Date  . Anxiety   . Asthma     Patient Active Problem List   Diagnosis Date Noted  . Rosacea 09/06/2015  . Abscess, umbilical 84/16/6063  . Post op infection 07/15/2015  . Subcutaneous abscess 07/14/2015  . Dermoid cyst of left ovary   . Pelvic adhesions   . Amenorrhea 03/23/2013  . Bipolar disorder ([place]) 03/03/2012  . Sinus infection 12/20/2011  . Callus of foot 01/12/2011  . PAP SMER CERV W/HI GRADE SQUAMOUS INTRAEPITH LES 10/19/2010  . IBS 07/09/2008  . IRREGULAR MENSTRUAL CYCLE 09/19/2007  . ANXIETY STATE NOS 01/10/2007  . OBESITY, NOS 01/02/2007  . TOBACCO DEPENDENCE 01/02/2007  . RHINITIS, ALLERGIC 01/02/2007  . ASTHMA, PERSISTENT 01/02/2007  . CERVICAL DYSPLASIA 01/02/2007    Past Surgical History:  Procedure Laterality Date  . CHOLECYSTECTOMY    . INCISION AND DRAINAGE ABSCESS N/A 07/14/2015   Procedure: INCISION AND DRAINAGE UMBILICAL ABSCESS;  Surgeon: Guss Bunde, MD;  Location: [place];  Service: Gynecology;  Laterality: N/A;  . LAPAROSCOPIC UNILATERAL SALPINGO OOPHERECTOMY Left 06/28/2015   Procedure: LAPAROSCOPIC  LEFT SALPINGO OOPHORECTOMY;  Surgeon: Mora Bellman, MD;  Location: [facility];  Service: Gynecology;  Laterality: Left;  . TUBAL LIGATION      OB History    Gravida  5   Para  4   Term  4   Preterm      AB  1   Living  4     SAB  1   TAB      Ectopic      Multiple      Live Births               Home Medications    Prior to Admission medications   Medication Sig Start Date End Date Taking? Authorizing Provider  cetirizine (ZYRTEC) 10 MG tablet Take 10 mg by mouth daily.   Yes [provider]  acetaminophen (TYLENOL) 500 MG tablet Take 500 mg by mouth every 6 (six) hours as needed.    [provider]  albuterol (VENTOLIN HFA) 108 (90 Base) MCG/ACT inhaler Inhale 2 puffs into the lungs every 4 (four) hours as needed for wheezing or shortness of breath. 04/06/19   Robyn Haber, MD  clindamycin (CLEOCIN) 150 MG capsule Take 1 capsule (150 mg total) by mouth 3 (three) times daily. 04/06/19   Robyn Haber, MD  fluticasone (FLONASE) 50 MCG/ACT nasal spray Place 1 spray into both nostrils daily.    [provider]  ibuprofen (ADVIL,MOTRIN) 600 MG tablet Take 1 tablet (600  mg total) by mouth every 6 (six) hours as needed for moderate pain. 07/18/15   Truett Mainland, DO  ondansetron (ZOFRAN ODT) 4 MG disintegrating tablet 4mg  ODT q6 hours prn nausea/vomit 11/12/16   Etta Quill, NP    Family History Family History  Problem Relation Age of Onset  . Depression Mother   . Heart disease Father   . Cancer Maternal Grandmother        breast    Social History Social History   Tobacco Use  . Smoking status: Current Every Day Smoker    Packs/day: 0.00    Types: Cigarettes    Last attempt to quit: 04/02/2015    Years since quitting: 4.0  . Smokeless tobacco: Never Used  Substance Use Topics  . Alcohol use: No    Alcohol/week: 0.0 standard drinks  . Drug use: No    Comment: uses E-cigs without the nicotine     Allergies   Amoxicillin;  Erythromycin; Penicillins; and Latex   Review of Systems Review of Systems  HENT: Positive for congestion, ear pain, rhinorrhea and sneezing.   Respiratory: Positive for cough.   All other systems reviewed and are negative.    Physical Exam Triage Vital Signs ED Triage Vitals  Enc Vitals Group     BP 04/06/19 1358 109/76     Pulse Rate 04/06/19 1358 68     Resp 04/06/19 1358 18     Temp 04/06/19 1358 98.2 F (36.8 C)     Temp Source 04/06/19 1358 Oral     SpO2 04/06/19 1358 98 %     Weight --      Height --      Head Circumference --      Peak Flow --      Pain Score 04/06/19 1355 0     Pain Loc --      Pain Edu? --      Excl. in [place]? --    No data found.  Updated Vital Signs BP 109/76 (BP Location: Left Arm)   Pulse 68   Temp 98.2 F (36.8 C) (Oral)   Resp 18   SpO2 98%    Physical Exam Vitals signs and nursing note reviewed.  Constitutional:      Appearance: Normal appearance.  HENT:     Head: Normocephalic.     Right Ear: Tympanic membrane, ear canal and external ear normal.     Left Ear: Tympanic membrane, ear canal and external ear normal.     Nose:     Comments: Mucopurulent and blood streaked bilateral discharge.    Mouth/Throat:     Mouth: Mucous membranes are moist.     Pharynx: Oropharynx is clear.  Eyes:     Conjunctiva/sclera: Conjunctivae normal.  Neck:     Musculoskeletal: Normal range of motion and neck supple.  Cardiovascular:     Rate and Rhythm: Normal rate.     Heart sounds: Normal heart sounds.  Pulmonary:     Effort: Pulmonary effort is normal.     Breath sounds: Normal breath sounds.  Musculoskeletal: Normal range of motion.  Skin:    General: Skin is warm.  Neurological:     General: No focal deficit present.     Mental Status: She is alert.  Psychiatric:        Mood and Affect: Mood normal.      UC Treatments / Results  Labs (all labs ordered are listed, but only abnormal results are displayed) Labs  Reviewed - No  data to display  EKG None  Radiology No results found.  Procedures Procedures (including critical care time)  Medications Ordered in UC Medications - No data to display  Initial Impression / Assessment and Plan / UC Course  I have reviewed the triage vital signs and the nursing notes.  Pertinent labs & imaging results that were available during my care of the patient were reviewed by me and considered in my medical decision making (see chart for details).    Final Clinical Impressions(s) / UC Diagnoses   Final diagnoses:  Chronic maxillary sinusitis  Mild intermittent asthma, unspecified whether complicated   Discharge Instructions   None    ED Prescriptions    Medication Sig Dispense Auth. Provider   clindamycin (CLEOCIN) 150 MG capsule Take 1 capsule (150 mg total) by mouth 3 (three) times daily. 28 capsule Robyn Haber, MD   albuterol (VENTOLIN HFA) 108 (90 Base) MCG/ACT inhaler Inhale 2 puffs into the lungs every 4 (four) hours as needed for wheezing or shortness of breath. Maries, MD     Controlled Substance Prescriptions [place] Controlled Substance Registry consulted? Not Applicable   Robyn Haber, MD 04/06/19 1417    Robyn Haber, MD 04/06/19 1420

## 2019-04-06 NOTE — ED Triage Notes (Signed)
Patient reports seasonal allergies.  Symptoms for 1 1/2 months.  Patient has had sneezing, runny nose, cough, intermittent ear pain and occasional headaches.  Patient reports the weather impacts her symptoms, rain makes pain significantly worse.

## 2019-07-08 ENCOUNTER — Ambulatory Visit (HOSPITAL_COMMUNITY)
Admission: EM | Admit: 2019-07-08 | Discharge: 2019-07-08 | Disposition: A | Discharge status: Home / Self Care | Payer: BC Managed Care – PPO | Attending: Family Medicine | Admitting: Family Medicine

## 2019-07-08 ENCOUNTER — Encounter (HOSPITAL_COMMUNITY): Payer: Self-pay

## 2019-07-08 DIAGNOSIS — R21 Rash and other nonspecific skin eruption: Secondary | ICD-10-CM

## 2019-07-08 MED ORDER — PERMETHRIN 5 % EX CREA: TOPICAL_CREAM | CUTANEOUS | 1 refills | Status: DC

## 2019-07-08 MED ORDER — TRIAMCINOLONE ACETONIDE 0.1 % EX CREA: 1.0000 "application " | TOPICAL_CREAM | Freq: Two times a day (BID) | CUTANEOUS | 0 refills | Status: DC

## 2019-07-08 NOTE — Discharge Instructions (Signed)
Apply the permethrin cream from head to toe and leave on overnight. Wash completely off in the morning After you wash this cream off in the morning you can start using the triamcinolone cream for itching and inflammation You can also use Benadryl as needed for itching Follow up as needed for continued or worsening symptoms

## 2019-07-08 NOTE — ED Triage Notes (Signed)
Pt states she has a rash on her arms and neck this has been going on for 3 weeks.

## 2019-07-09 NOTE — ED Provider Notes (Signed)
Sara Booth    CSN: [id number] Arrival date & time: 07/08/19  1329      History   Chief Complaint Chief Complaint  Patient presents with  . Rash    HPI Sara Booth is Booth 43 y.o. female.   Patient is Booth 43 year old female presents today with rash.  This is been present and worsening over the past 3 weeks.  The rash is located to bilateral arms, hands, neck.  She has not done anything to treat the rash.  Reports that she has been to multiple different houses.  Daughter with recent bedbug infestation.  The rash is very pruritic and worse at night.    Denies any fever, joint pain. Denies any recent changes in lotions, detergents, foods or other possible irritants. No recent travel. Nobody else at home has the rash. Patient has been outside but denies any contact with plants or insects. No new foods or medications.   ROS per HPI      Past Medical History:  Diagnosis Date  . Anxiety   . Asthma     Patient Active Problem List   Diagnosis Date Noted  . Rosacea 09/06/2015  . Abscess, umbilical [id number]  . Post op infection 07/15/2015  . Subcutaneous abscess 07/14/2015  . Dermoid cyst of left ovary   . Pelvic adhesions   . Amenorrhea 03/23/2013  . Bipolar disorder ([place]) 03/03/2012  . Sinus infection 12/20/2011  . Callus of foot 01/12/2011  . PAP SMER CERV W/HI GRADE SQUAMOUS INTRAEPITH LES 10/19/2010  . IBS 07/09/2008  . IRREGULAR MENSTRUAL CYCLE 09/19/2007  . ANXIETY STATE NOS 01/10/2007  . OBESITY, NOS 01/02/2007  . TOBACCO DEPENDENCE 01/02/2007  . RHINITIS, ALLERGIC 01/02/2007  . ASTHMA, PERSISTENT 01/02/2007  . CERVICAL DYSPLASIA 01/02/2007    Past Surgical History:  Procedure Laterality Date  . CHOLECYSTECTOMY    . INCISION AND DRAINAGE ABSCESS N/Booth 07/14/2015   Procedure: INCISION AND DRAINAGE UMBILICAL ABSCESS;  Surgeon: Guss Bunde, MD;  Location: [facility];  Service: Gynecology;  Laterality: N/Booth;  . LAPAROSCOPIC UNILATERAL SALPINGO OOPHERECTOMY  Left 06/28/2015   Procedure: LAPAROSCOPIC LEFT SALPINGO OOPHORECTOMY;  Surgeon: Mora Bellman, MD;  Location: [facility];  Service: Gynecology;  Laterality: Left;  . TUBAL LIGATION      OB History    Gravida  5   Para  4   Term  4   Preterm      AB  1   Living  4     SAB  1   TAB      Ectopic      Multiple      Live Births               Home Medications    Prior to Admission medications   Medication Sig Start Date End Date Taking? Authorizing Provider  acetaminophen (TYLENOL) 500 MG tablet Take 500 mg by mouth every 6 (six) hours as needed.    [provider]  albuterol (VENTOLIN HFA) 108 (90 Base) MCG/ACT inhaler Inhale 2 puffs into the lungs every 4 (four) hours as needed for wheezing or shortness of breath. 04/06/19   Robyn Haber, MD  cetirizine (ZYRTEC) 10 MG tablet Take 10 mg by mouth daily.    [provider]  clindamycin (CLEOCIN) 150 MG capsule Take 1 capsule (150 mg total) by mouth 3 (three) times daily. 04/06/19   Robyn Haber, MD  fluticasone (FLONASE) 50 MCG/ACT nasal spray Place 1 spray into both nostrils daily.  [provider]  ibuprofen (ADVIL,MOTRIN) 600 MG tablet Take 1 tablet (600 mg total) by mouth every 6 (six) hours as needed for moderate pain. 07/18/15   Truett Mainland, DO  ondansetron (ZOFRAN ODT) 4 MG disintegrating tablet 4mg  ODT q6 hours prn nausea/vomit 11/12/16   Etta Quill, NP  permethrin (ELIMITE) 5 % cream Apply to affected area once, apply head toe and then leave on for 8 hours and wash off. Repeat in 2 weeks if needed. 60 07/08/19   Sara Fife A, NP  triamcinolone cream (KENALOG) 0.1 % Apply 1 application topically 2 (two) times daily. 07/08/19   Orvan July, NP    Family History Family History  Problem Relation Age of Onset  . Depression Mother   . Heart disease Father   . Cancer Maternal Grandmother        breast    Social History Social History   Tobacco Use  . Smoking status: Current  Every Day Smoker    Packs/day: 0.00    Types: Cigarettes    Last attempt to quit: 04/02/2015    Years since quitting: 4.2  . Smokeless tobacco: Never Used  Substance Use Topics  . Alcohol use: No    Alcohol/week: 0.0 standard drinks  . Drug use: No    Comment: uses E-cigs without the nicotine     Allergies   Amoxicillin, Erythromycin, Penicillins, and Latex   Review of Systems Review of Systems   Physical Exam Triage Vital Signs ED Triage Vitals  Enc Vitals Group     BP 07/08/19 1432 114/69     Pulse Rate 07/08/19 1432 100     Resp 07/08/19 1432 16     Temp 07/08/19 1432 98.4 F (36.9 C)     Temp Source 07/08/19 1432 Oral     SpO2 07/08/19 1432 100 %     Weight --      Height --      Head Circumference --      Peak Flow --      Pain Score 07/08/19 1514 0     Pain Loc --      Pain Edu? --      Excl. in [place]? --    No data found.  Updated Vital Signs BP 114/69 (BP Location: Right Arm)   Pulse 100   Temp 98.4 F (36.9 C) (Oral)   Resp 16   LMP 06/20/2019   SpO2 100%   Visual Acuity Right Eye Distance:   Left Eye Distance:   Bilateral Distance:    Right Eye Near:   Left Eye Near:    Bilateral Near:     Physical Exam Vitals signs and nursing note reviewed.  Constitutional:      General: She is not in acute distress.    Appearance: Normal appearance. She is not ill-appearing, toxic-appearing or diaphoretic.  HENT:     Head: Normocephalic.     Nose: Nose normal.     Mouth/Throat:     Pharynx: Oropharynx is clear.  Eyes:     Conjunctiva/sclera: Conjunctivae normal.  Neck:     Musculoskeletal: Normal range of motion.  Pulmonary:     Effort: Pulmonary effort is normal.  Musculoskeletal: Normal range of motion.  Skin:    General: Skin is warm and dry.     Findings: Rash present.  Neurological:     Mental Status: She is alert.  Psychiatric:        Mood and Affect: Mood normal.  UC Treatments / Results  Labs (all labs ordered are listed,  but only abnormal results are displayed) Labs Reviewed - No data to display  EKG   Radiology No results found.  Procedures Procedures (including critical care time)  Medications Ordered in UC Medications - No data to display  Initial Impression / Assessment and Plan / UC Course  I have reviewed the triage vital signs and the nursing notes.  Pertinent labs & imaging results that were available during my care of the patient were reviewed by me and considered in my medical decision making (see chart for details).     Rash consistent with scabies infestation.  Will treat with permethrin cram and triamcinolone 2 x day for itching and inflammation She can also tale benadryl as needed.  Instructions given.  Follow up as needed for continued or worsening symptoms  Final Clinical Impressions(s) / UC Diagnoses   Final diagnoses:  Rash and nonspecific skin eruption     Discharge Instructions     Apply the permethrin cream from head to toe and leave on overnight. Wash completely off in the morning After you wash this cream off in the morning you can start using the triamcinolone cream for itching and inflammation You can also use Benadryl as needed for itching Follow up as needed for continued or worsening symptoms     ED Prescriptions    Medication Sig Dispense Auth. Provider   permethrin (ELIMITE) 5 % cream Apply to affected area once, apply head toe and then leave on for 8 hours and wash off. Repeat in 2 weeks if needed. 60 60 g Marolyn Urschel A, NP   triamcinolone cream (KENALOG) 0.1 % Apply 1 application topically 2 (two) times daily. 30 g Loura Halt A, NP     Controlled Substance Prescriptions [place] Controlled Substance Registry consulted? Not Applicable   Orvan July, NP 07/09/19 1202

## 2019-10-28 ENCOUNTER — Encounter (HOSPITAL_COMMUNITY): Payer: Self-pay

## 2019-10-28 ENCOUNTER — Other Ambulatory Visit: Payer: Self-pay

## 2019-10-28 ENCOUNTER — Ambulatory Visit (HOSPITAL_COMMUNITY)
Admission: EM | Admit: 2019-10-28 | Discharge: 2019-10-28 | Disposition: A | Discharge status: Home / Self Care | Payer: BC Managed Care – PPO | Attending: Family Medicine | Admitting: Family Medicine

## 2019-10-28 DIAGNOSIS — R05 Cough: Secondary | ICD-10-CM | POA: Diagnosis not present

## 2019-10-28 DIAGNOSIS — R059 Cough, unspecified: Secondary | ICD-10-CM

## 2019-10-28 DIAGNOSIS — R6883 Chills (without fever): Secondary | ICD-10-CM

## 2019-10-28 DIAGNOSIS — Z9189 Other specified personal risk factors, not elsewhere classified: Secondary | ICD-10-CM

## 2019-10-28 NOTE — ED Triage Notes (Signed)
Patient presents to Urgent Care with complaints of needing a note to be out of work the next few days while she waits for her covid test to result since being tested yesterday. Patient reports she has a cough, chills, and slightly sore throat, states covid has been going around at work.

## 2019-10-29 NOTE — ED Provider Notes (Signed)
[facility]   [id number] 10/28/19 Arrival Time: [id number]  ASSESSMENT & PLAN:  1. Cough   2. Chills   3. At increased risk of exposure to COVID-19 virus      COVID-19 testing sent. See work note for [employer].  Follow-up Information    [facility].   Specialty: Urgent Care Why: As needed. Contact information: [place] [phone]          Reviewed expectations re: course of current medical issues. Questions answered. Outlined signs and symptoms indicating need for more acute intervention. Patient verbalized understanding. After Visit Summary given.   SUBJECTIVE: History from: patient. Sara Booth is a 43 y.o. female who reports being tested for COVID yesterday. No results yet. Needs a work note.. Known COVID-19 contact: co-workers. Recent travel: none. Denies: runny nose, congestion, fever, difficulty breathing and headache. Does reports chills and a mild cough. Normal PO intake without n/v/d.  ROS: As per HPI.   OBJECTIVE:  Vitals:   10/28/19 0950  BP: 132/82  Pulse: 75  Resp: 15  Temp: 98.2 F (36.8 C)  TempSrc: Oral  SpO2: 98%    General appearance: alert; no distress Eyes: PERRLA; EOMI; conjunctiva normal HENT: [place]; AT; nasal mucosa normal; oral mucosa normal Neck: supple  Lungs: speaks full sentences without difficulty; unlabored Heart: regular rate and rhythm Abdomen: soft, non-tender Extremities: no edema Skin: warm and dry Neurologic: normal gait Psychological: alert and cooperative; normal mood and affect  L Allergies  Allergen Reactions  . Amoxicillin Other (See Comments)    Made her very hot and her lips turned blue. She also had frequent urination.  . Erythromycin     Was told not to take it in the hospital  . Penicillins Other (See Comments)    She can take any cillins    Correction  Pt can  Not take  cillens per pt  . Latex Rash     Past Medical History:  Diagnosis Date  . Anxiety   . Asthma    Social History   Socioeconomic History  . Marital status: Married    Spouse name: Not on file  . Number of children: Not on file  . Years of education: Not on file  . Highest education level: Not on file  Occupational History  . Not on file  Tobacco Use  . Smoking status: Current Every Day Smoker    Packs/day: 1.00    Types: Cigarettes  . Smokeless tobacco: Never Used  Substance and Sexual Activity  . Alcohol use: No    Alcohol/week: 0.0 standard drinks  . Drug use: No    Comment: uses E-cigs without the nicotine  . Sexual activity: Not Currently  Other Topics Concern  . Not on file  Social History Narrative  . Not on file   Social Determinants of Health   Financial Resource Strain:   . Difficulty of Paying Living Expenses: Not on file  Food Insecurity:   . Worried About Charity fundraiser in the Last Year: Not on file  . Ran Out of Food in the Last Year: Not on file  Transportation Needs:   . Lack of Transportation (Medical): Not on file  . Lack of Transportation (Non-Medical): Not on file  Physical Activity:   . Days of Exercise per Week: Not on file  . Minutes of Exercise per Session: Not on file  Stress:   . Feeling of  Stress : Not on file  Social Connections:   . Frequency of Communication with Friends and Family: Not on file  . Frequency of Social Gatherings with Friends and Family: Not on file  . Attends Religious Services: Not on file  . Active Member of Clubs or Organizations: Not on file  . Attends Archivist Meetings: Not on file  . Marital Status: Not on file  Intimate Partner Violence:   . Fear of Current or Ex-Partner: Not on file  . Emotionally Abused: Not on file  . Physically Abused: Not on file  . Sexually Abused: Not on file   Family History  Problem Relation Age of Onset  . Depression Mother   . Heart disease Father   . Cancer Maternal Grandmother         breast   Past Surgical History:  Procedure Laterality Date  . CHOLECYSTECTOMY    . INCISION AND DRAINAGE ABSCESS N/A 07/14/2015   Procedure: INCISION AND DRAINAGE UMBILICAL ABSCESS;  Surgeon: Guss Bunde, MD;  Location: [facility];  Service: Gynecology;  Laterality: N/A;  . LAPAROSCOPIC UNILATERAL SALPINGO OOPHERECTOMY Left 06/28/2015   Procedure: LAPAROSCOPIC LEFT SALPINGO OOPHORECTOMY;  Surgeon: Mora Bellman, MD;  Location: [facility];  Service: Gynecology;  Laterality: Left;  . TUBAL LIGATION       Vanessa Kick, MD 10/29/19 [phone]

## 2020-03-17 ENCOUNTER — Telehealth (HOSPITAL_COMMUNITY): Payer: Self-pay

## 2020-03-17 ENCOUNTER — Ambulatory Visit (HOSPITAL_COMMUNITY)
Admission: EM | Admit: 2020-03-17 | Discharge: 2020-03-17 | Disposition: A | Discharge status: Home / Self Care | Payer: BC Managed Care – PPO | Attending: Family Medicine | Admitting: Family Medicine

## 2020-03-17 ENCOUNTER — Other Ambulatory Visit: Payer: Self-pay

## 2020-03-17 ENCOUNTER — Encounter (HOSPITAL_COMMUNITY): Payer: Self-pay

## 2020-03-17 DIAGNOSIS — H6983 Other specified disorders of Eustachian tube, bilateral: Secondary | ICD-10-CM | POA: Diagnosis not present

## 2020-03-17 DIAGNOSIS — J45909 Unspecified asthma, uncomplicated: Secondary | ICD-10-CM | POA: Insufficient documentation

## 2020-03-17 DIAGNOSIS — Z880 Allergy status to penicillin: Secondary | ICD-10-CM | POA: Insufficient documentation

## 2020-03-17 DIAGNOSIS — Z20822 Contact with and (suspected) exposure to covid-19: Secondary | ICD-10-CM | POA: Diagnosis not present

## 2020-03-17 DIAGNOSIS — R05 Cough: Secondary | ICD-10-CM | POA: Diagnosis present

## 2020-03-17 DIAGNOSIS — H6993 Unspecified Eustachian tube disorder, bilateral: Secondary | ICD-10-CM

## 2020-03-17 DIAGNOSIS — F17210 Nicotine dependence, cigarettes, uncomplicated: Secondary | ICD-10-CM | POA: Insufficient documentation

## 2020-03-17 DIAGNOSIS — J019 Acute sinusitis, unspecified: Secondary | ICD-10-CM | POA: Diagnosis not present

## 2020-03-17 DIAGNOSIS — Z881 Allergy status to other antibiotic agents status: Secondary | ICD-10-CM | POA: Diagnosis not present

## 2020-03-17 DIAGNOSIS — J029 Acute pharyngitis, unspecified: Secondary | ICD-10-CM | POA: Diagnosis not present

## 2020-03-17 DIAGNOSIS — Z79899 Other long term (current) drug therapy: Secondary | ICD-10-CM | POA: Diagnosis not present

## 2020-03-17 MED ORDER — ALBUTEROL SULFATE HFA 108 (90 BASE) MCG/ACT IN AERS
2.0000 | INHALATION_SPRAY | Freq: Once | RESPIRATORY_TRACT | Status: AC
  Administered 2020-03-17: 2 via RESPIRATORY_TRACT

## 2020-03-17 MED ORDER — DOXYCYCLINE HYCLATE 100 MG PO CAPS: 100.0000 mg | ORAL_CAPSULE | Freq: Two times a day (BID) | ORAL | 0 refills | Status: DC

## 2020-03-17 MED ORDER — ALBUTEROL SULFATE HFA 108 (90 BASE) MCG/ACT IN AERS
INHALATION_SPRAY | RESPIRATORY_TRACT | Status: AC
  Filled 2020-03-17: qty 6.7

## 2020-03-17 MED ORDER — DOXYCYCLINE HYCLATE 100 MG PO CAPS
100.0000 mg | ORAL_CAPSULE | Freq: Two times a day (BID) | ORAL | 0 refills | Status: AC
Start: 2020-03-17 — End: 2020-03-24

## 2020-03-17 NOTE — ED Triage Notes (Signed)
C/o cough, runny nose, ear pain, upset stomach, and sore throat x1 wk. Also needs a new albuterol inhaler.

## 2020-03-17 NOTE — ED Provider Notes (Signed)
[place]    CSN: [id number] Arrival date & time: 03/17/20  1029      History   Chief Complaint Chief Complaint  Patient presents with  . Nasal Congestion  . Cough  . Sore Throat  . Otalgia    HPI Sara Booth is a 44 y.o. female.   Patient is a 44 year old female with past medical history of anxiety and asthma.  She presents today with approximately 7 days or more of cough, runny nose, ear pain, sore throat.  Symptoms have been constant and worsening.  She has had increased facial pressure and bilateral ear pain.  She has been taking her daily allergy medication and Flonase without much relief of her symptoms.  Has felt feverish but no recorded fevers at home.  Denies any body aches, chills, nausea, vomiting, loss of taste or smell.  ROS per HPI      Past Medical History:  Diagnosis Date  . Anxiety   . Asthma     Patient Active Problem List   Diagnosis Date Noted  . Rosacea 09/06/2015  . Abscess, umbilical [id number]  . Post op infection 07/15/2015  . Subcutaneous abscess 07/14/2015  . Dermoid cyst of left ovary   . Pelvic adhesions   . Amenorrhea 03/23/2013  . Bipolar disorder ([place]) 03/03/2012  . Sinus infection 12/20/2011  . Callus of foot 01/12/2011  . PAP SMER CERV W/HI GRADE SQUAMOUS INTRAEPITH LES 10/19/2010  . IBS 07/09/2008  . IRREGULAR MENSTRUAL CYCLE 09/19/2007  . ANXIETY STATE NOS 01/10/2007  . OBESITY, NOS 01/02/2007  . TOBACCO DEPENDENCE 01/02/2007  . RHINITIS, ALLERGIC 01/02/2007  . ASTHMA, PERSISTENT 01/02/2007  . CERVICAL DYSPLASIA 01/02/2007    Past Surgical History:  Procedure Laterality Date  . CHOLECYSTECTOMY    . INCISION AND DRAINAGE ABSCESS N/A 07/14/2015   Procedure: INCISION AND DRAINAGE UMBILICAL ABSCESS;  Surgeon: Guss Bunde, MD;  Location: [facility];  Service: Gynecology;  Laterality: N/A;  . LAPAROSCOPIC UNILATERAL SALPINGO OOPHERECTOMY Left 06/28/2015   Procedure: LAPAROSCOPIC LEFT SALPINGO OOPHORECTOMY;   Surgeon: Mora Bellman, MD;  Location: [facility];  Service: Gynecology;  Laterality: Left;  . TUBAL LIGATION      OB History    Gravida  5   Para  4   Term  4   Preterm      AB  1   Living  4     SAB  1   TAB      Ectopic      Multiple      Live Births               Home Medications    Prior to Admission medications   Medication Sig Start Date End Date Taking? Authorizing Provider  acetaminophen (TYLENOL) 500 MG tablet Take 500 mg by mouth every 6 (six) hours as needed.    [provider]  albuterol (VENTOLIN HFA) 108 (90 Base) MCG/ACT inhaler Inhale 2 puffs into the lungs every 4 (four) hours as needed for wheezing or shortness of breath. 04/06/19   Robyn Haber, MD  cetirizine (ZYRTEC) 10 MG tablet Take 10 mg by mouth daily.    [provider]  doxycycline (VIBRAMYCIN) 100 MG capsule Take 1 capsule (100 mg total) by mouth 2 (two) times daily for 7 days. 03/17/20 03/24/20  Loura Halt A, NP  fluticasone (FLONASE) 50 MCG/ACT nasal spray Place 1 spray into both nostrils daily.    [provider]  ibuprofen (ADVIL,MOTRIN) 600 MG  tablet Take 1 tablet (600 mg total) by mouth every 6 (six) hours as needed for moderate pain. 07/18/15   Truett Mainland, DO  ondansetron (ZOFRAN ODT) 4 MG disintegrating tablet 4mg  ODT q6 hours prn nausea/vomit 11/12/16   Etta Quill, NP  permethrin (ELIMITE) 5 % cream Apply to affected area once, apply head toe and then leave on for 8 hours and wash off. Repeat in 2 weeks if needed. 60 07/08/19   Zayin Valadez A, NP  triamcinolone cream (KENALOG) 0.1 % Apply 1 application topically 2 (two) times daily. 07/08/19   Orvan July, NP    Family History Family History  Problem Relation Age of Onset  . Depression Mother   . Heart disease Father   . Cancer Maternal Grandmother        breast    Social History Social History   Tobacco Use  . Smoking status: Current Every Day Smoker    Packs/day: 1.00    Types:  Cigarettes  . Smokeless tobacco: Never Used  Substance Use Topics  . Alcohol use: No    Alcohol/week: 0.0 standard drinks  . Drug use: No    Comment: uses E-cigs without the nicotine     Allergies   Amoxicillin, Erythromycin, Penicillins, and Latex   Review of Systems Review of Systems   Physical Exam Triage Vital Signs ED Triage Vitals [03/17/20 1103]  Enc Vitals Group     BP 120/66     Pulse Rate 72     Resp 19     Temp 98.8 F (37.1 C)     Temp Source Oral     SpO2 98 %     Weight      Height      Head Circumference      Peak Flow      Pain Score 5     Pain Loc      Pain Edu?      Excl. in [place]?    No data found.  Updated Vital Signs BP 120/66 (BP Location: Left Arm)   Pulse 72   Temp 98.8 F (37.1 C) (Oral)   Resp 19   LMP 03/03/2020 (Within Days)   SpO2 98%   Visual Acuity Right Eye Distance:   Left Eye Distance:   Bilateral Distance:    Right Eye Near:   Left Eye Near:    Bilateral Near:     Physical Exam Vitals and nursing note reviewed.  Constitutional:      General: She is not in acute distress.    Appearance: Normal appearance. She is not ill-appearing, toxic-appearing or diaphoretic.  HENT:     Head: Normocephalic.     Right Ear: Hearing normal. Tympanic membrane is injected.     Left Ear: Hearing normal. Tympanic membrane is injected.     Nose: Congestion and rhinorrhea present.     Mouth/Throat:     Pharynx: Oropharynx is clear. Uvula midline. Posterior oropharyngeal erythema present.  Eyes:     Conjunctiva/sclera: Conjunctivae normal.  Cardiovascular:     Rate and Rhythm: Normal rate and regular rhythm.  Pulmonary:     Effort: Pulmonary effort is normal.     Breath sounds: Normal breath sounds.  Abdominal:     Palpations: Abdomen is soft.     Tenderness: There is no abdominal tenderness.  Musculoskeletal:        General: Normal range of motion.     Cervical back: Normal range of motion.  Skin:  General: Skin is warm and  dry.     Findings: No rash.  Neurological:     Mental Status: She is alert.  Psychiatric:        Mood and Affect: Mood normal.      UC Treatments / Results  Labs (all labs ordered are listed, but only abnormal results are displayed) Labs Reviewed  SARS CORONAVIRUS 2 (TAT 6-24 HRS)    EKG   Radiology No results found.  Procedures Procedures (including critical care time)  Medications Ordered in UC Medications  albuterol (VENTOLIN HFA) 108 (90 Base) MCG/ACT inhaler 2 puff (2 puffs Inhalation Given 03/17/20 1116)    Initial Impression / Assessment and Plan / UC Course  I have reviewed the triage vital signs and the nursing notes.  Pertinent labs & imaging results that were available during my care of the patient were reviewed by me and considered in my medical decision making (see chart for details).     Sinusitis and eustachian tube dysfunction  Treating with doxycycline based on amoxicillin allergy.  We will have her continue Flonase and Zyrtec daily. Ibuprofen as needed for pain Follow up as needed for continued or worsening symptoms  Final Clinical Impressions(s) / UC Diagnoses   Final diagnoses:  Acute non-recurrent sinusitis, unspecified location  Eustachian tube dysfunction, bilateral     Discharge Instructions     Keep taking the Flonase and Zyrtec daily. You can do ibuprofen as needed for pain Augmentin to treat for sinus infection Follow up as needed for continued or worsening symptoms     ED Prescriptions    Medication Sig Dispense Auth. Provider   doxycycline (VIBRAMYCIN) 100 MG capsule Take 1 capsule (100 mg total) by mouth 2 (two) times daily for 7 days. 14 capsule Lafonda Patron A, NP     PDMP not reviewed this encounter.   Orvan July, NP 03/17/20 1245

## 2020-03-17 NOTE — Discharge Instructions (Signed)
Keep taking the Flonase and Zyrtec daily. You can do ibuprofen as needed for pain Augmentin to treat for sinus infection Follow up as needed for continued or worsening symptoms

## 2020-03-18 LAB — SARS CORONAVIRUS 2 (TAT 6-24 HRS): SARS Coronavirus 2: NEGATIVE

## 2020-06-08 ENCOUNTER — Ambulatory Visit (HOSPITAL_COMMUNITY)
Admission: EM | Admit: 2020-06-08 | Discharge: 2020-06-08 | Disposition: A | Discharge status: Home / Self Care | Payer: BC Managed Care – PPO | Attending: Family Medicine | Admitting: Family Medicine

## 2020-06-08 ENCOUNTER — Other Ambulatory Visit: Payer: Self-pay

## 2020-06-08 ENCOUNTER — Encounter (HOSPITAL_COMMUNITY): Payer: Self-pay

## 2020-06-08 DIAGNOSIS — Z20822 Contact with and (suspected) exposure to covid-19: Secondary | ICD-10-CM | POA: Diagnosis not present

## 2020-06-08 DIAGNOSIS — R197 Diarrhea, unspecified: Secondary | ICD-10-CM | POA: Diagnosis present

## 2020-06-08 DIAGNOSIS — J069 Acute upper respiratory infection, unspecified: Secondary | ICD-10-CM | POA: Diagnosis not present

## 2020-06-08 DIAGNOSIS — Z79899 Other long term (current) drug therapy: Secondary | ICD-10-CM | POA: Insufficient documentation

## 2020-06-08 DIAGNOSIS — F17210 Nicotine dependence, cigarettes, uncomplicated: Secondary | ICD-10-CM | POA: Insufficient documentation

## 2020-06-08 DIAGNOSIS — Z881 Allergy status to other antibiotic agents status: Secondary | ICD-10-CM | POA: Insufficient documentation

## 2020-06-08 DIAGNOSIS — Z880 Allergy status to penicillin: Secondary | ICD-10-CM | POA: Insufficient documentation

## 2020-06-08 DIAGNOSIS — J45909 Unspecified asthma, uncomplicated: Secondary | ICD-10-CM | POA: Diagnosis not present

## 2020-06-08 DIAGNOSIS — R05 Cough: Secondary | ICD-10-CM | POA: Insufficient documentation

## 2020-06-08 DIAGNOSIS — F419 Anxiety disorder, unspecified: Secondary | ICD-10-CM | POA: Diagnosis not present

## 2020-06-08 DIAGNOSIS — Z9049 Acquired absence of other specified parts of digestive tract: Secondary | ICD-10-CM | POA: Insufficient documentation

## 2020-06-08 DIAGNOSIS — Z90721 Acquired absence of ovaries, unilateral: Secondary | ICD-10-CM | POA: Diagnosis not present

## 2020-06-08 MED ORDER — PREDNISONE 20 MG PO TABS
40.0000 mg | ORAL_TABLET | Freq: Every day | ORAL | 0 refills | Status: AC
Start: 2020-06-08 — End: 2020-06-13

## 2020-06-08 MED ORDER — ALBUTEROL SULFATE HFA 108 (90 BASE) MCG/ACT IN AERS: 1.0000 | INHALATION_SPRAY | Freq: Four times a day (QID) | RESPIRATORY_TRACT | 0 refills | Status: DC | PRN

## 2020-06-08 MED ORDER — ONDANSETRON 4 MG PO TBDP
4.0000 mg | ORAL_TABLET | Freq: Three times a day (TID) | ORAL | 0 refills | Status: DC | PRN
Start: 2020-06-08 — End: 2022-01-31

## 2020-06-08 NOTE — ED Triage Notes (Signed)
Pt presents with diarrhea, cough and chills and chills x 3 days. Ibuprofen gives somewhat relief.

## 2020-06-08 NOTE — ED Provider Notes (Signed)
[place]    CSN: [id number] Arrival date & time: 06/08/20  1607      History   Chief Complaint Chief Complaint  Patient presents with   Diarrhea   Cough    HPI Sara Booth is a 44 y.o. female.   Sara Booth presents with complaints of symptoms which started last week. Sore throat initially. By 8/1, also with cough. Yesterday stomach upset started. Has had 5 BM's since last night. Not necessarily loose or watery, however. No blood or black. Decreased appetite. Nausea. No vomiting. No known fevers but has been chilled. Cough is productive sometimes. Nasal drainage. Throat has improved. No ear pain. No abdominal pain. Some shortness of breath, has been using her inhaler more frequently. Inhaler does help. Wheezing. History of asthma. Smokes cigarettes, approximately 1 ppd. No known ill contacts. No history of covid-19 and has not received vaccination. 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 Still tolerating water and liquids. Urinating although decreased.    ROS per HPI, negative if not otherwise mentioned.       Past Medical History:  Diagnosis Date   Anxiety    Asthma     Patient Active Problem List   Diagnosis Date Noted   Rosacea 76/73/4193   Abscess, umbilical 79/12/4095   Post op infection 07/15/2015   Subcutaneous abscess 07/14/2015   Dermoid cyst of left ovary    Pelvic adhesions    Amenorrhea 03/23/2013   Bipolar disorder ([place]) 03/03/2012   Sinus infection 12/20/2011   Callus of foot 01/12/2011   PAP SMER CERV W/HI GRADE SQUAMOUS INTRAEPITH LES 10/19/2010   IBS 07/09/2008   IRREGULAR MENSTRUAL CYCLE 09/19/2007   ANXIETY STATE NOS 01/10/2007   OBESITY, NOS 01/02/2007   TOBACCO DEPENDENCE 01/02/2007   RHINITIS, ALLERGIC 01/02/2007   ASTHMA, PERSISTENT 01/02/2007   CERVICAL DYSPLASIA 01/02/2007    Past Surgical History:  Procedure Laterality Date   CHOLECYSTECTOMY     INCISION AND DRAINAGE ABSCESS N/A 07/14/2015    Procedure: INCISION AND DRAINAGE UMBILICAL ABSCESS;  Surgeon: Guss Bunde, MD;  Location: [facility];  Service: Gynecology;  Laterality: N/A;   LAPAROSCOPIC UNILATERAL SALPINGO OOPHERECTOMY Left 06/28/2015   Procedure: LAPAROSCOPIC LEFT SALPINGO OOPHORECTOMY;  Surgeon: Mora Bellman, MD;  Location: [facility];  Service: Gynecology;  Laterality: Left;   TUBAL LIGATION      OB History    Gravida  5   Para  4   Term  4   Preterm      AB  1   Living  4     SAB  1   TAB      Ectopic      Multiple      Live Births               Home Medications    Prior to Admission medications   Medication Sig Start Date End Date Taking? Authorizing Provider  caffeine 200 MG TABS tablet Take 200 mg by mouth every 4 (four) hours as needed.   Yes [provider]  acetaminophen (TYLENOL) 500 MG tablet Take 500 mg by mouth every 6 (six) hours as needed.    [provider]  albuterol (PROAIR HFA) 108 (90 Base) MCG/ACT inhaler Inhale 1-2 puffs into the lungs every 6 (six) hours as needed for wheezing or shortness of breath. 06/08/20   Zigmund Gottron, NP  albuterol (VENTOLIN HFA) 108 (90 Base) MCG/ACT inhaler Inhale 2 puffs into the lungs every 4 (  four) hours as needed for wheezing or shortness of breath. 04/06/19   Robyn Haber, MD  cetirizine (ZYRTEC) 10 MG tablet Take 10 mg by mouth daily.    [provider]  fluticasone (FLONASE) 50 MCG/ACT nasal spray Place 1 spray into both nostrils daily.    [provider]  ibuprofen (ADVIL,MOTRIN) 600 MG tablet Take 1 tablet (600 mg total) by mouth every 6 (six) hours as needed for moderate pain. 07/18/15   Truett Mainland, DO  ondansetron (ZOFRAN-ODT) 4 MG disintegrating tablet Take 1 tablet (4 mg total) by mouth every 8 (eight) hours as needed for nausea or vomiting. 06/08/20   Zigmund Gottron, NP  permethrin (ELIMITE) 5 % cream Apply to affected area once, apply head toe and then leave on for 8 hours and wash off.  Repeat in 2 weeks if needed. 60 07/08/19   Bast, Traci A, NP  predniSONE (DELTASONE) 20 MG tablet Take 2 tablets (40 mg total) by mouth daily with breakfast for 5 days. 06/08/20 06/13/20  Augusto Gamble B, NP  triamcinolone cream (KENALOG) 0.1 % Apply 1 application topically 2 (two) times daily. 07/08/19   Orvan July, NP    Family History Family History  Problem Relation Age of Onset   Depression Mother    Heart disease Father    Cancer Maternal Grandmother        breast    Social History Social History   Tobacco Use   Smoking status: Current Every Day Smoker    Packs/day: 1.00    Types: Cigarettes   Smokeless tobacco: Never Used  Vaping Use   Vaping Use: Former  Substance Use Topics   Alcohol use: No    Alcohol/week: 0.0 standard drinks   Drug use: No    Comment: uses E-cigs without the nicotine     Allergies   Amoxicillin, Erythromycin, Penicillins, and Latex   Review of Systems Review of Systems   Physical Exam Triage Vital Signs ED Triage Vitals  Enc Vitals Group     BP 06/08/20 1715 (!) 115/45     Pulse Rate 06/08/20 1715 68     Resp 06/08/20 1715 18     Temp 06/08/20 1715 98.4 F (36.9 C)     Temp Source 06/08/20 1715 Oral     SpO2 06/08/20 1715 98 %     Weight --      Height --      Head Circumference --      Peak Flow --      Pain Score 06/08/20 1714 0     Pain Loc --      Pain Edu? --      Excl. in [place]? --    No data found.  Updated Vital Signs BP (!) 115/45 (BP Location: Right Arm)    Pulse 68    Temp 98.4 F (36.9 C) (Oral)    Resp 18    LMP  (Within Months) Comment: 1 month   SpO2 98%   Visual Acuity Right Eye Distance:   Left Eye Distance:   Bilateral Distance:    Right Eye Near:   Left Eye Near:    Bilateral Near:     Physical Exam Constitutional:      General: She is not in acute distress.    Appearance: She is well-developed.  Cardiovascular:     Rate and Rhythm: Normal rate.  Pulmonary:     Effort: Pulmonary effort  is normal.  Skin:  General: Skin is warm and dry.  Neurological:     Mental Status: She is alert and oriented to person, place, and time.      UC Treatments / Results  Labs (all labs ordered are listed, but only abnormal results are displayed) Labs Reviewed  SARS CORONAVIRUS 2 (TAT 6-24 HRS)    EKG   Radiology No results found.  Procedures Procedures (including critical care time)  Medications Ordered in UC Medications - No data to display  Initial Impression / Assessment and Plan / UC Course  I have reviewed the triage vital signs and the nursing notes.  Pertinent labs & imaging results that were available during my care of the patient were reviewed by me and considered in my medical decision making (see chart for details).     Tolerating water in clinic, non toxic in appearance. Vitals stable at this time. History and physical consistent with viral illness.  Supportive cares recommended. covid testing pending. Isolation recommended. Return precautions provided. Patient verbalized understanding and agreeable to plan.   Final Clinical Impressions(s) / UC Diagnoses   Final diagnoses:  Upper respiratory tract infection, unspecified type  Diarrhea, unspecified type     Discharge Instructions     Small frequent sips of fluids- Pedialyte, Gatorade, water, broth- to maintain hydration.   Tylenol and/or ibuprofen as needed for pain or fevers.   Prednisone to help with cough and wheezing. Continue with your inhaler as needed.  Zofran every 8 hours as needed for nausea or vomiting.   Self isolate until covid results are back and negative.  Will notify you by phone of any positive findings. Your negative results will be sent through your MyChart.     If symptoms worsen or do not improve in the next week to return to be seen or to follow up with your PCP.     ED Prescriptions    Medication Sig Dispense Auth. Provider   ondansetron (ZOFRAN-ODT) 4 MG disintegrating  tablet Take 1 tablet (4 mg total) by mouth every 8 (eight) hours as needed for nausea or vomiting. 12 tablet Augusto Gamble B, NP   predniSONE (DELTASONE) 20 MG tablet Take 2 tablets (40 mg total) by mouth daily with breakfast for 5 days. 10 tablet Augusto Gamble B, NP   albuterol (PROAIR HFA) 108 (90 Base) MCG/ACT inhaler Inhale 1-2 puffs into the lungs every 6 (six) hours as needed for wheezing or shortness of breath. 8 g Zigmund Gottron, NP     PDMP not reviewed this encounter.   Zigmund Gottron, NP 06/08/20 1910

## 2020-06-08 NOTE — Discharge Instructions (Addendum)
Small frequent sips of fluids- Pedialyte, Gatorade, water, broth- to maintain hydration.   Tylenol and/or ibuprofen as needed for pain or fevers.   Prednisone to help with cough and wheezing. Continue with your inhaler as needed.  Zofran every 8 hours as needed for nausea or vomiting.   Self isolate until covid results are back and negative.  Will notify you by phone of any positive findings. Your negative results will be sent through your MyChart.     If symptoms worsen or do not improve in the next week to return to be seen or to follow up with your PCP.

## 2020-06-09 LAB — SARS CORONAVIRUS 2 (TAT 6-24 HRS): SARS Coronavirus 2: NEGATIVE

## 2020-09-06 ENCOUNTER — Other Ambulatory Visit: Payer: Self-pay

## 2020-09-06 ENCOUNTER — Encounter (HOSPITAL_COMMUNITY): Payer: Self-pay

## 2020-09-06 ENCOUNTER — Ambulatory Visit (HOSPITAL_COMMUNITY)
Admission: EM | Admit: 2020-09-06 | Discharge: 2020-09-06 | Disposition: A | Discharge status: Home / Self Care | Payer: BC Managed Care – PPO | Attending: Urgent Care | Admitting: Urgent Care

## 2020-09-06 DIAGNOSIS — R0981 Nasal congestion: Secondary | ICD-10-CM

## 2020-09-06 DIAGNOSIS — J4540 Moderate persistent asthma, uncomplicated: Secondary | ICD-10-CM

## 2020-09-06 DIAGNOSIS — J3089 Other allergic rhinitis: Secondary | ICD-10-CM | POA: Diagnosis not present

## 2020-09-06 DIAGNOSIS — R0602 Shortness of breath: Secondary | ICD-10-CM

## 2020-09-06 DIAGNOSIS — R059 Cough, unspecified: Secondary | ICD-10-CM

## 2020-09-06 DIAGNOSIS — R070 Pain in throat: Secondary | ICD-10-CM

## 2020-09-06 DIAGNOSIS — F17200 Nicotine dependence, unspecified, uncomplicated: Secondary | ICD-10-CM

## 2020-09-06 MED ORDER — ALBUTEROL SULFATE HFA 108 (90 BASE) MCG/ACT IN AERS
2.0000 | INHALATION_SPRAY | RESPIRATORY_TRACT | 0 refills | Status: DC | PRN
Start: 2020-09-06 — End: 2020-12-31

## 2020-09-06 MED ORDER — PROMETHAZINE-DM 6.25-15 MG/5ML PO SYRP
5.0000 mL | ORAL_SOLUTION | Freq: Every evening | ORAL | 0 refills | Status: DC | PRN
Start: 2020-09-06 — End: 2021-04-25

## 2020-09-06 MED ORDER — BENZONATATE 100 MG PO CAPS: 100.0000 mg | ORAL_CAPSULE | Freq: Three times a day (TID) | ORAL | 0 refills | Status: DC | PRN

## 2020-09-06 MED ORDER — PREDNISONE 20 MG PO TABS
ORAL_TABLET | ORAL | 0 refills | Status: DC
Start: 2020-09-06 — End: 2021-04-25

## 2020-09-06 MED ORDER — DOXYCYCLINE HYCLATE 100 MG PO CAPS
100.0000 mg | ORAL_CAPSULE | Freq: Two times a day (BID) | ORAL | 0 refills | Status: DC
Start: 2020-09-06 — End: 2021-11-10

## 2020-09-06 NOTE — ED Triage Notes (Signed)
Pt c/o productive cough, congestion, runny nose with green sputum, chills, LLQ abdominal pain for several weeks, sore throat onset this morning. Also reports SOB especially upon waking.  Reports she is out of her inhaler.  States h/o strep B infection.   Denies fever, n/v/d.  Bilateral lungs CTA, left lower lobe with slightly decreased exchange. Oropharynx with mild erythema. Has not had COVID vaccines.

## 2020-09-06 NOTE — ED Provider Notes (Signed)
[place]   MRN: [id number] DOB: July 15, 1976  Subjective:   Sara Booth is a 44 y.o. female presenting for several week hx of persistent productive cough, sinus pressure and congestion, flare of her allergies. Has started to have shob, cough. Has hx of asthma, is out of her inhaler. She smokes 1/2ppd. No COVID vaccinations. Uses Zyrtec, Flonase daily.  Has not lost sense of taste and smell, denies chest pain, body aches.  No current facility-administered medications for this encounter.  Current Outpatient Medications:    acetaminophen (TYLENOL) 500 MG tablet, Take 500 mg by mouth every 6 (six) hours as needed., Disp: , Rfl:    albuterol (PROAIR HFA) 108 (90 Base) MCG/ACT inhaler, Inhale 1-2 puffs into the lungs every 6 (six) hours as needed for wheezing or shortness of breath., Disp: 8 g, Rfl: 0   albuterol (VENTOLIN HFA) 108 (90 Base) MCG/ACT inhaler, Inhale 2 puffs into the lungs every 4 (four) hours as needed for wheezing or shortness of breath., Disp: 1 Inhaler, Rfl: 11   caffeine 200 MG TABS tablet, Take 200 mg by mouth every 4 (four) hours as needed., Disp: , Rfl:    cetirizine (ZYRTEC) 10 MG tablet, Take 10 mg by mouth daily., Disp: , Rfl:    fluticasone (FLONASE) 50 MCG/ACT nasal spray, Place 1 spray into both nostrils daily., Disp: , Rfl:    ibuprofen (ADVIL,MOTRIN) 600 MG tablet, Take 1 tablet (600 mg total) by mouth every 6 (six) hours as needed for moderate pain., Disp: 30 tablet, Rfl: 2   ondansetron (ZOFRAN-ODT) 4 MG disintegrating tablet, Take 1 tablet (4 mg total) by mouth every 8 (eight) hours as needed for nausea or vomiting., Disp: 12 tablet, Rfl: 0   permethrin (ELIMITE) 5 % cream, Apply to affected area once, apply head toe and then leave on for 8 hours and wash off. Repeat in 2 weeks if needed. 60, Disp: 60 g, Rfl: 1   triamcinolone cream (KENALOG) 0.1 %, Apply 1 application topically 2 (two) times daily., Disp: 30 g, Rfl: 0   Allergies    Allergen Reactions   Amoxicillin Other (See Comments)    Made her very hot and her lips turned blue. She also had frequent urination.   Erythromycin     Was told not to take it in the hospital   Penicillins Other (See Comments)    She can take any cillins    Correction  Pt can  Not take  cillens per pt   Latex Rash    Past Medical History:  Diagnosis Date   Anxiety    Asthma      Past Surgical History:  Procedure Laterality Date   CHOLECYSTECTOMY     INCISION AND DRAINAGE ABSCESS N/A 07/14/2015   Procedure: INCISION AND DRAINAGE UMBILICAL ABSCESS;  Surgeon: Guss Bunde, MD;  Location: [facility];  Service: Gynecology;  Laterality: N/A;   LAPAROSCOPIC UNILATERAL SALPINGO OOPHERECTOMY Left 06/28/2015   Procedure: LAPAROSCOPIC LEFT SALPINGO OOPHORECTOMY;  Surgeon: Mora Bellman, MD;  Location: [facility];  Service: Gynecology;  Laterality: Left;   TUBAL LIGATION      Family History  Problem Relation Age of Onset   Depression Mother    Heart disease Father    Cancer Maternal Grandmother        breast    Social History   Tobacco Use   Smoking status: Current Every Day Smoker    Packs/day: 1.00    Types: Cigarettes  Smokeless tobacco: Never Used  Vaping Use   Vaping Use: Former  Substance Use Topics   Alcohol use: No    Alcohol/week: 0.0 standard drinks   Drug use: No    Comment: uses E-cigs without the nicotine    ROS   Objective:   Vitals: BP 130/79 (BP Location: Left Arm)    Pulse 61    Temp 98.6 F (37 C) (Oral)    Resp 18    LMP 09/02/2020    SpO2 100%   Physical Exam Constitutional:      General: She is not in acute distress.    Appearance: Normal appearance. She is well-developed. She is not ill-appearing, toxic-appearing or diaphoretic.  HENT:     Head: Normocephalic and atraumatic.     Right Ear: Tympanic membrane, ear canal and external ear normal. No drainage or tenderness. No middle ear effusion. Tympanic membrane is not  erythematous.     Left Ear: Tympanic membrane, ear canal and external ear normal. No drainage or tenderness.  No middle ear effusion. Tympanic membrane is not erythematous.     Nose: Congestion and rhinorrhea present.     Mouth/Throat:     Mouth: Mucous membranes are moist. No oral lesions.     Pharynx: No pharyngeal swelling, oropharyngeal exudate, posterior oropharyngeal erythema or uvula swelling.     Tonsils: No tonsillar exudate or tonsillar abscesses.  Eyes:     General: No scleral icterus.       Right eye: No discharge.        Left eye: No discharge.     Extraocular Movements: Extraocular movements intact.     Right eye: Normal extraocular motion.     Left eye: Normal extraocular motion.     Conjunctiva/sclera: Conjunctivae normal.     Pupils: Pupils are equal, round, and reactive to light.  Cardiovascular:     Rate and Rhythm: Normal rate and regular rhythm.     Pulses: Normal pulses.     Heart sounds: Normal heart sounds. No murmur heard.  No friction rub. No gallop.   Pulmonary:     Effort: Pulmonary effort is normal. No respiratory distress.     Breath sounds: Normal breath sounds. No stridor. No wheezing, rhonchi or rales.  Musculoskeletal:     Cervical back: Normal range of motion and neck supple.  Lymphadenopathy:     Cervical: No cervical adenopathy.  Skin:    General: Skin is warm and dry.     Findings: No rash.  Neurological:     General: No focal deficit present.     Mental Status: She is alert and oriented to person, place, and time.  Psychiatric:        Mood and Affect: Mood normal.        Behavior: Behavior normal.        Thought Content: Thought content normal.        Judgment: Judgment normal.     Assessment and Plan :   PDMP not reviewed this encounter.  1. Allergic rhinitis due to other allergic trigger, unspecified seasonality   2. Cough   3. Nasal congestion   4. Throat pain   5. Shortness of breath   6. Smoker   7. Moderate persistent  asthma without complication     Patient refused COVID-19 test.  Recommended prednisone course to address her allergic rhinitis flare, doxycycline for secondary sinus infection.  Refilled her albuterol inhaler.  Provided her with cough suppression medications. Counseled patient on  potential for adverse effects with medications prescribed/recommended today, ER and return-to-clinic precautions discussed, patient verbalized understanding.    Jaynee Eagles, PA-C 09/06/20 1754

## 2020-11-02 ENCOUNTER — Encounter (HOSPITAL_COMMUNITY): Payer: Self-pay

## 2020-11-02 ENCOUNTER — Ambulatory Visit (HOSPITAL_COMMUNITY)
Admission: EM | Admit: 2020-11-02 | Discharge: 2020-11-02 | Disposition: A | Discharge status: Home / Self Care | Payer: BC Managed Care – PPO | Attending: Family Medicine | Admitting: Family Medicine

## 2020-11-02 ENCOUNTER — Other Ambulatory Visit: Payer: Self-pay

## 2020-11-02 DIAGNOSIS — Z880 Allergy status to penicillin: Secondary | ICD-10-CM | POA: Diagnosis not present

## 2020-11-02 DIAGNOSIS — R059 Cough, unspecified: Secondary | ICD-10-CM

## 2020-11-02 DIAGNOSIS — F17210 Nicotine dependence, cigarettes, uncomplicated: Secondary | ICD-10-CM | POA: Insufficient documentation

## 2020-11-02 DIAGNOSIS — J029 Acute pharyngitis, unspecified: Secondary | ICD-10-CM | POA: Diagnosis present

## 2020-11-02 DIAGNOSIS — Z20822 Contact with and (suspected) exposure to covid-19: Secondary | ICD-10-CM | POA: Insufficient documentation

## 2020-11-02 DIAGNOSIS — Z881 Allergy status to other antibiotic agents status: Secondary | ICD-10-CM | POA: Insufficient documentation

## 2020-11-02 LAB — SARS CORONAVIRUS 2 (TAT 6-24 HRS): SARS Coronavirus 2: NEGATIVE

## 2020-11-02 NOTE — Discharge Instructions (Addendum)
You have been tested for COVID-19 today. °If your test returns positive, you will receive a phone call from [facility] regarding your results. °Negative test results are not called. °Both positive and negative results area always visible on MyChart. °If you do not have a MyChart account, sign up instructions are provided in your discharge papers. °Please do not hesitate to contact us should you have questions or concerns. ° °

## 2020-11-02 NOTE — ED Triage Notes (Signed)
Pt presents with sore throat and cough x 3 days. States throat feels dry, needs to drink water constantly. Denies fever, sob, nasal congestion.

## 2020-11-02 NOTE — ED Provider Notes (Signed)
[place]   [id number] 11/02/20 Arrival Time: [id number]  ASSESSMENT & PLAN:  1. Cough   2. Sore throat      COVID-19 testing sent. See letter/work note on file for self-isolation guidelines. OTC symptom care as needed.    Follow-up Information    [facility].   Specialty: Urgent Care Why: As needed. Contact information: [place] [phone]              Reviewed expectations re: course of current medical issues. Questions answered. Outlined signs and symptoms indicating need for more acute intervention. Understanding verbalized. After Visit Summary given.   SUBJECTIVE: History from: patient. Sara Booth is a 44 y.o. female who presents with worries regarding COVID-19. Known COVID-19 contact: question co-workers. Recent travel: none. Reports: cough and sore throat/post-nasal drainage past 3 days. Denies: fever and difficulty breathing. Normal PO intake without n/v/d.    OBJECTIVE:  Vitals:   11/02/20 1610  BP: 123/61  Pulse: (!) 57  Resp: 18  Temp: 98.2 F (36.8 C)  TempSrc: Oral  SpO2: 97%    General appearance: alert; no distress Eyes: PERRLA; EOMI; conjunctiva normal HENT: ; AT; with nasal congestion; throat with cobblestoning/erythema Neck: supple  Lungs: speaks full sentences without difficulty; unlabored Extremities: no edema Skin: warm and dry Neurologic: normal gait Psychological: alert and cooperative; normal mood and affect  Labs:  Labs Reviewed  SARS CORONAVIRUS 2 (TAT 6-24 HRS)     Allergies  Allergen Reactions  . Amoxicillin Other (See Comments)    Made her very hot and her lips turned blue. She also had frequent urination.  . Erythromycin     Was told not to take it in the hospital  . Penicillins Other (See Comments)    She can take any cillins    Correction  Pt can  Not take  cillens per pt  . Latex Rash    Past Medical History:  Diagnosis  Date  . Anxiety   . Asthma    Social History   Socioeconomic History  . Marital status: Married    Spouse name: Not on file  . Number of children: Not on file  . Years of education: Not on file  . Highest education level: Not on file  Occupational History  . Not on file  Tobacco Use  . Smoking status: Current Every Day Smoker    Packs/day: 1.00    Types: Cigarettes  . Smokeless tobacco: Never Used  Vaping Use  . Vaping Use: Former  Substance and Sexual Activity  . Alcohol use: No    Alcohol/week: 0.0 standard drinks  . Drug use: No    Comment: uses E-cigs without the nicotine  . Sexual activity: Not Currently  Other Topics Concern  . Not on file  Social History Narrative  . Not on file   Social Determinants of Health   Financial Resource Strain: Not on file  Food Insecurity: Not on file  Transportation Needs: Not on file  Physical Activity: Not on file  Stress: Not on file  Social Connections: Not on file  Intimate Partner Violence: Not on file   Family History  Problem Relation Age of Onset  . Depression Mother   . Heart disease Father   . Cancer Maternal Grandmother        breast   Past Surgical History:  Procedure Laterality Date  . CHOLECYSTECTOMY    . INCISION AND DRAINAGE ABSCESS  N/A 07/14/2015   Procedure: INCISION AND DRAINAGE UMBILICAL ABSCESS;  Surgeon: Lesly Dukes, MD;  Location: [facility] OR;  Service: Gynecology;  Laterality: N/A;  . LAPAROSCOPIC UNILATERAL SALPINGO OOPHERECTOMY Left 06/28/2015   Procedure: LAPAROSCOPIC LEFT SALPINGO OOPHORECTOMY;  Surgeon: Catalina Antigua, MD;  Location: [facility];  Service: Gynecology;  Laterality: Left;  . TUBAL LIGATION       Mardella Layman, MD 11/02/20 [phone]

## 2020-12-31 ENCOUNTER — Ambulatory Visit (HOSPITAL_COMMUNITY)
Admission: EM | Admit: 2020-12-31 | Discharge: 2020-12-31 | Disposition: A | Discharge status: Home / Self Care | Payer: BC Managed Care – PPO | Attending: Student | Admitting: Student

## 2020-12-31 ENCOUNTER — Other Ambulatory Visit: Payer: Self-pay

## 2020-12-31 ENCOUNTER — Encounter (HOSPITAL_COMMUNITY): Payer: Self-pay

## 2020-12-31 DIAGNOSIS — U071 COVID-19: Secondary | ICD-10-CM | POA: Insufficient documentation

## 2020-12-31 DIAGNOSIS — J4541 Moderate persistent asthma with (acute) exacerbation: Secondary | ICD-10-CM | POA: Diagnosis not present

## 2020-12-31 DIAGNOSIS — R110 Nausea: Secondary | ICD-10-CM | POA: Diagnosis not present

## 2020-12-31 DIAGNOSIS — Z20822 Contact with and (suspected) exposure to covid-19: Secondary | ICD-10-CM | POA: Diagnosis not present

## 2020-12-31 LAB — SARS CORONAVIRUS 2 (TAT 6-24 HRS): SARS Coronavirus 2: POSITIVE — AB

## 2020-12-31 MED ORDER — ONDANSETRON HCL 8 MG PO TABS
8.0000 mg | ORAL_TABLET | Freq: Three times a day (TID) | ORAL | 0 refills | Status: DC | PRN
Start: 2020-12-31 — End: 2022-01-31

## 2020-12-31 MED ORDER — ALBUTEROL SULFATE HFA 108 (90 BASE) MCG/ACT IN AERS: 1.0000 | INHALATION_SPRAY | Freq: Four times a day (QID) | RESPIRATORY_TRACT | 0 refills | Status: DC | PRN

## 2020-12-31 NOTE — Discharge Instructions (Addendum)
-  Continue albuterol as needed for shortness of breath. Refill sent.  -Zofran sent for nausea. Use this as needed up to 3x daily. -Seek additional medical treatment if you experience worsening shortness of breath despite treatment, new/worsening  fever/chills, chest pain, abdominal pain, inability to hydrate by mouth, etc. -Today we sent a Covid test.  This should take about 1 day to come back.  He will go straight to eval on MyChart.

## 2020-12-31 NOTE — ED Provider Notes (Signed)
[place]    CSN: [id number] Arrival date & time: 12/31/20  1211      History   Chief Complaint Chief Complaint  Patient presents with  . Cough  . Sore Throat  . Fever    HPI Sara Booth is a 45 y.o. female presenting with cough, sore throat, fevers x5 days following covid exposure. History asthma and anxiety. Asthma is typically well controlled on albuterol inhaler but she is almost out of this. Nausea but no vomiting. One episode of diarrhea daily. Shortness of breath that is worse with exertion and relieved by albuterol. Ear pressure but denies pain, hearing changes, dizziness. Denies fevers/chills, vomiting,  chest pain, facial pain, teeth pain, headaches, sore throat, loss of taste/smell, swollen lymph nodes, ear pain.    HPI  Past Medical History:  Diagnosis Date  . Anxiety   . Asthma     Patient Active Problem List   Diagnosis Date Noted  . Rosacea 09/06/2015  . Abscess, umbilical 43/15/4008  . Post op infection 07/15/2015  . Subcutaneous abscess 07/14/2015  . Dermoid cyst of left ovary   . Pelvic adhesions   . Amenorrhea 03/23/2013  . Bipolar disorder ([place]) 03/03/2012  . Sinus infection 12/20/2011  . Callus of foot 01/12/2011  . PAP SMER CERV W/HI GRADE SQUAMOUS INTRAEPITH LES 10/19/2010  . IBS 07/09/2008  . IRREGULAR MENSTRUAL CYCLE 09/19/2007  . ANXIETY STATE NOS 01/10/2007  . OBESITY, NOS 01/02/2007  . TOBACCO DEPENDENCE 01/02/2007  . RHINITIS, ALLERGIC 01/02/2007  . ASTHMA, PERSISTENT 01/02/2007  . CERVICAL DYSPLASIA 01/02/2007    Past Surgical History:  Procedure Laterality Date  . CHOLECYSTECTOMY    . INCISION AND DRAINAGE ABSCESS N/A 07/14/2015   Procedure: INCISION AND DRAINAGE UMBILICAL ABSCESS;  Surgeon: Guss Bunde, MD;  Location: [place];  Service: Gynecology;  Laterality: N/A;  . LAPAROSCOPIC UNILATERAL SALPINGO OOPHERECTOMY Left 06/28/2015   Procedure: LAPAROSCOPIC LEFT SALPINGO OOPHORECTOMY;  Surgeon: Mora Bellman, MD;   Location: [facility];  Service: Gynecology;  Laterality: Left;  . TUBAL LIGATION      OB History    Gravida  5   Para  4   Term  4   Preterm      AB  1   Living  4     SAB  1   IAB      Ectopic      Multiple      Live Births               Home Medications    Prior to Admission medications   Medication Sig Start Date End Date Taking? Authorizing Provider  albuterol (VENTOLIN HFA) 108 (90 Base) MCG/ACT inhaler Inhale 1-2 puffs into the lungs every 6 (six) hours as needed for wheezing or shortness of breath. 12/31/20  Yes Hazel Sams, PA-C  ondansetron (ZOFRAN) 8 MG tablet Take 1 tablet (8 mg total) by mouth every 8 (eight) hours as needed for nausea or vomiting. 12/31/20  Yes Hazel Sams, PA-C  acetaminophen (TYLENOL) 500 MG tablet Take 500 mg by mouth every 6 (six) hours as needed.    [provider]  benzonatate (TESSALON) 100 MG capsule Take 1-2 capsules (100-200 mg total) by mouth 3 (three) times daily as needed. 09/06/20   Jaynee Eagles, PA-C  caffeine 200 MG TABS tablet Take 200 mg by mouth every 4 (four) hours as needed.    [provider]  cetirizine (ZYRTEC) 10 MG tablet Take 10 mg  by mouth daily.    [provider]  doxycycline (VIBRAMYCIN) 100 MG capsule Take 1 capsule (100 mg total) by mouth 2 (two) times daily. 09/06/20   Jaynee Eagles, PA-C  fluticasone (FLONASE) 50 MCG/ACT nasal spray Place 1 spray into both nostrils daily.    [provider]  ibuprofen (ADVIL,MOTRIN) 600 MG tablet Take 1 tablet (600 mg total) by mouth every 6 (six) hours as needed for moderate pain. 07/18/15   Truett Mainland, DO  ondansetron (ZOFRAN-ODT) 4 MG disintegrating tablet Take 1 tablet (4 mg total) by mouth every 8 (eight) hours as needed for nausea or vomiting. 06/08/20   Zigmund Gottron, NP  permethrin (ELIMITE) 5 % cream Apply to affected area once, apply head toe and then leave on for 8 hours and wash off. Repeat in 2 weeks if needed. 60 07/08/19    Loura Halt A, NP  predniSONE (DELTASONE) 20 MG tablet Take 2 tablets daily with breakfast. 09/06/20   Jaynee Eagles, PA-C  Prenatal Vit-Fe Fumarate-FA (PRENATAL MULTIVITAMIN) TABS tablet Take 1 tablet by mouth daily at 12 noon.    [provider]  promethazine-dextromethorphan (PROMETHAZINE-DM) 6.25-15 MG/5ML syrup Take 5 mLs by mouth at bedtime as needed for cough. 09/06/20   Jaynee Eagles, PA-C  triamcinolone cream (KENALOG) 0.1 % Apply 1 application topically 2 (two) times daily. 07/08/19   Orvan July, NP    Family History Family History  Problem Relation Age of Onset  . Depression Mother   . Heart disease Father   . Cancer Maternal Grandmother        breast    Social History Social History   Tobacco Use  . Smoking status: Current Every Day Smoker    Packs/day: 1.00    Types: Cigarettes  . Smokeless tobacco: Never Used  Vaping Use  . Vaping Use: Former  Substance Use Topics  . Alcohol use: No    Alcohol/week: 0.0 standard drinks  . Drug use: No    Comment: uses E-cigs without the nicotine     Allergies   Amoxicillin, Erythromycin, Penicillins, and Latex   Review of Systems Review of Systems  Constitutional: Negative for appetite change, chills and fever.  HENT: Positive for congestion. Negative for ear pain, rhinorrhea, sinus pressure, sinus pain and sore throat.   Eyes: Negative for redness and visual disturbance.  Respiratory: Positive for cough and shortness of breath. Negative for chest tightness and wheezing.   Cardiovascular: Negative for chest pain and palpitations.  Gastrointestinal: Positive for diarrhea and nausea. Negative for abdominal pain, constipation and vomiting.  Genitourinary: Negative for dysuria, frequency and urgency.  Musculoskeletal: Negative for myalgias.  Neurological: Negative for dizziness, weakness and headaches.  Psychiatric/Behavioral: Negative for confusion.  All other systems reviewed and are negative.    Physical  Exam Triage Vital Signs ED Triage Vitals  Enc Vitals Group     BP 12/31/20 1227 125/72     Pulse Rate 12/31/20 1227 77     Resp 12/31/20 1227 16     Temp 12/31/20 1227 98.3 F (36.8 C)     Temp Source 12/31/20 1227 Oral     SpO2 12/31/20 1227 97 %     Weight --      Height --      Head Circumference --      Peak Flow --      Pain Score 12/31/20 1228 8     Pain Loc --      Pain Edu? --  Excl. in GC? --    No data found.  Updated Vital Signs BP 125/72 (BP Location: Right Arm)   Pulse 77   Temp 98.3 F (36.8 C) (Oral)   Resp 16   SpO2 97%   Visual Acuity Right Eye Distance:   Left Eye Distance:   Bilateral Distance:    Right Eye Near:   Left Eye Near:    Bilateral Near:     Physical Exam Vitals reviewed.  Constitutional:      General: She is not in acute distress.    Appearance: Normal appearance. She is not ill-appearing.  HENT:     Head: Normocephalic and atraumatic.     Right Ear: Hearing, tympanic membrane, ear canal and external ear normal. No swelling or tenderness. There is no impacted cerumen. No mastoid tenderness. Tympanic membrane is not perforated, erythematous, retracted or bulging.     Left Ear: Hearing, tympanic membrane, ear canal and external ear normal. No swelling or tenderness. There is no impacted cerumen. No mastoid tenderness. Tympanic membrane is not perforated, erythematous, retracted or bulging.     Nose:     Right Sinus: No maxillary sinus tenderness or frontal sinus tenderness.     Left Sinus: No maxillary sinus tenderness or frontal sinus tenderness.     Mouth/Throat:     Mouth: Mucous membranes are moist.     Pharynx: Uvula midline. No oropharyngeal exudate or posterior oropharyngeal erythema.     Tonsils: No tonsillar exudate.  Cardiovascular:     Rate and Rhythm: Normal rate and regular rhythm.     Heart sounds: Normal heart sounds.  Pulmonary:     Breath sounds: Normal air entry. Decreased breath sounds present. No wheezing,  rhonchi or rales.     Comments: Decreased breath sounds throughout Chest:     Chest wall: No tenderness.  Abdominal:     General: Abdomen is flat. Bowel sounds are normal.     Tenderness: There is no abdominal tenderness. There is no guarding or rebound.  Lymphadenopathy:     Cervical: No cervical adenopathy.  Neurological:     General: No focal deficit present.     Mental Status: She is alert and oriented to person, place, and time.  Psychiatric:        Attention and Perception: Attention and perception normal.        Mood and Affect: Mood and affect normal.        Behavior: Behavior normal. Behavior is cooperative.        Thought Content: Thought content normal.        Judgment: Judgment normal.      UC Treatments / Results  Labs (all labs ordered are listed, but only abnormal results are displayed) Labs Reviewed  SARS CORONAVIRUS 2 (TAT 6-24 HRS)    EKG   Radiology No results found.  Procedures Procedures (including critical care time)  Medications Ordered in UC Medications - No data to display  Initial Impression / Assessment and Plan / UC Course  I have reviewed the triage vital signs and the nursing notes.  Pertinent labs & imaging results that were available during my care of the patient were reviewed by me and considered in my medical decision making (see chart for details).      This patient is a 45 year old female presenting with covid symptoms following covid exposure. History of asthma reasonably well controlled on albuterol inhaler; refilled today.  Zofran sent.  Rec good hydration.  covid test sent.  Isolation as per CDC Guidelines.  Return precautions- chest pain, shortness of breath, new/worsening fevers/chills, confusion, worsening of symptoms despite the above treatment plan, inability to hydrate by mouth, etc.   Spent over 40 minutes obtaining H&P, performing physical, discussing treatment plan and plan for follow-up with patient. Patient  agrees with plan.     Final Clinical Impressions(s) / UC Diagnoses   Final diagnoses:  Suspected COVID-19 virus infection  Exposure to confirmed case of COVID-19  Moderate persistent asthma with acute exacerbation  Nausea without vomiting     Discharge Instructions     -Continue albuterol as needed for shortness of breath. Refill sent.  -Zofran sent for nausea. Use this as needed up to 3x daily. -Seek additional medical treatment if you experience worsening shortness of breath despite treatment, new/worsening  fever/chills, chest pain, abdominal pain, inability to hydrate by mouth, etc. -Today we sent a Covid test.  This should take about 1 day to come back.  He will go straight to eval on MyChart.    ED Prescriptions    Medication Sig Dispense Auth. Provider   ondansetron (ZOFRAN) 8 MG tablet Take 1 tablet (8 mg total) by mouth every 8 (eight) hours as needed for nausea or vomiting. 20 tablet Hazel Sams, PA-C   albuterol (VENTOLIN HFA) 108 (90 Base) MCG/ACT inhaler Inhale 1-2 puffs into the lungs every 6 (six) hours as needed for wheezing or shortness of breath. 1 each Hazel Sams, PA-C     PDMP not reviewed this encounter.   Hazel Sams, PA-C 12/31/20 1355

## 2020-12-31 NOTE — ED Triage Notes (Signed)
Pt present possible covid exposure. Pt states on Tuesday she started having symptoms of cough, fever and body aches. Pt state having issues with diarrhea

## 2021-01-01 ENCOUNTER — Telehealth: Payer: Self-pay | Admitting: Family

## 2021-01-01 ENCOUNTER — Other Ambulatory Visit: Payer: Self-pay | Admitting: Family

## 2021-01-01 DIAGNOSIS — F17200 Nicotine dependence, unspecified, uncomplicated: Secondary | ICD-10-CM

## 2021-01-01 DIAGNOSIS — Z6836 Body mass index (BMI) 36.0-36.9, adult: Secondary | ICD-10-CM

## 2021-01-01 DIAGNOSIS — E669 Obesity, unspecified: Secondary | ICD-10-CM

## 2021-01-01 DIAGNOSIS — U071 COVID-19: Secondary | ICD-10-CM

## 2021-01-01 DIAGNOSIS — J4520 Mild intermittent asthma, uncomplicated: Secondary | ICD-10-CM

## 2021-01-01 NOTE — Progress Notes (Signed)
I connected by phone with Sara Booth on 01/01/2021 at 10:12 AM to discuss the potential use of a new treatment for mild to moderate COVID-19 viral infection in non-hospitalized patients.  This patient is a 45 y.o. female that meets the FDA criteria for Emergency Use Authorization of COVID monoclonal antibody sotrovimab.  Has a (+) direct SARS-CoV-2 viral test result  Has mild or moderate COVID-19   Is NOT hospitalized due to COVID-19  Is within 10 days of symptom onset  Has at least one of the high risk factor(s) for progression to severe COVID-19 and/or hospitalization as defined in EUA.  Specific high risk criteria : BMI > 25, Chronic Lung Disease and Other high risk medical condition per CDC:  Tobacco use, unvaccinated   I have spoken and communicated the following to the patient or parent/caregiver regarding COVID monoclonal antibody treatment:  1. FDA has authorized the emergency use for the treatment of mild to moderate COVID-19 in adults and pediatric patients with positive results of direct SARS-CoV-2 viral testing who are 5 years of age and older weighing at least 40 kg, and who are at high risk for progressing to severe COVID-19 and/or hospitalization.  2. The significant known and potential risks and benefits of COVID monoclonal antibody, and the extent to which such potential risks and benefits are unknown.  3. Information on available alternative treatments and the risks and benefits of those alternatives, including clinical trials.  4. Patients treated with COVID monoclonal antibody should continue to self-isolate and use infection control measures (e.g., wear mask, isolate, social distance, avoid sharing personal items, clean and disinfect "high touch" surfaces, and frequent handwashing) according to CDC guidelines.   5. The patient or parent/caregiver has the option to accept or refuse COVID monoclonal antibody treatment.  After reviewing this information with the  patient, the patient has agreed to receive one of the available covid 19 monoclonal antibodies and will be provided an appropriate fact sheet prior to infusion.   Mauricio Po, FNP 01/01/2021 10:12 AM

## 2021-01-01 NOTE — Telephone Encounter (Signed)
Called to discuss with patient about COVID-19 symptoms and the use of one of the available treatments for those with mild to moderate Covid symptoms and at a high risk of hospitalization.  Pt appears to qualify for outpatient treatment due to co-morbid conditions and/or a member of an at-risk group in accordance with the FDA Emergency Use Authorization.    Symptom onset: 12/26/20 Vaccinated: No Booster? No Immunocompromised? No Qualifiers: Asthma, obesity, tobacco use, unvaccinated   Sara Booth was seen at the [facility] on 12/31/20 with cough, sore throat and fevers going on for about 5 days. She is unvaccinated. Found to be positive for Covid. We discussed the risks, benefits, and potential costs associated with treatment with Sotrovimab and she wishes to continue with treatment.  Hello Sara Booth,   We contacted you because you were recently diagnosed with COVID-19 and may benefit from a new treatment for mild to moderate disease. This treatment helps reduce the chance of being hospitalized. For some patients with medical conditions that may increase the chances of an infection, the treatment also decreases the risk for serious symptoms related to COVID-19.   The Food and Drug Administration (FDA) approved emergency use of a new drug to treat patients with mild to moderate symptoms who have risk factors that could cause severe symptoms related to COVID-19. This new treatment is a monoclonal antibody. It works by attaching like a magnet to the SARS-CoV2 virus (the virus that causes COVID-19) and stops it from infecting more cells in your body. It does not kill the virus, but it prevents it from spreading throughout your body with the hope that it will decrease your symptoms after it is administered.   This new drug is an intravenous (IV) infusion called Sotrovimab that is given over one 30-minute session in our [facility] outpatient infusion clinic. You will need to stay about 60 minutes  after the infusion to ensure you are tolerating it well and to watch for any allergic reaction to the medication. More information will be given to you at the time of your appointment.   Important information:  . The potential side effects: 2-4% of recipients experience nausea, vomiting, diarrhea, dizziness, headaches, itching, worsening fevers or chills for around 24 hours. . There have been no serious infusion-related reactions. . Of the more than 3,000 patients who received the infusion, only one had an allergic response that ended once the infusion was stopped. This is why we monitor all of our patients closely for 60 minutes after the infusion.  . The COVID-19 vaccine (including boosters) no longer needs to be delayed after this infusion and can be safely administered after you have completed your isolation period.  . The medication itself is free, but your insurance will be charged an infusion fee. The amount you may owe later varies from insurance to insurance. If you do not have insurance, we can put you in touch with our billing department. Please contact your insurance agent to discuss prior to your appointment if you would like further details about billing specific to your policy. The CMS code is: M58  . If you have been tested outside of a [facility], you MUST bring a copy of your positive test with you the morning of your appointment. You may take a photo of this and upload to your MyChart portal,  have the testing facility fax the result to [phone] or email a copy to [email or web address].    You have been scheduled to  receive the monoclonal antibody therapy at [facility]:  01/02/21 at 11:30am    The address for the infusion clinic site is:   --The GPS address is [address], and the parking is located near the [employer] building where you will see a "COVID19 Infusion" feather banner marking the entrance to parking. (See photos below.)             --Enter into the 2nd entrance where the "wave, flag banner" is at the road. Turn into this 2nd entrance and immediately turn left or right to park in one of the marked spaces.   --Please stay in your car and call the desk for assistance inside at [phone]. Let us know which space you are in.    --The average time in the department is roughly 90 minutes to two hours for monoclonal treatment. This includes preparation of the medication, IV start and the required 60-minute monitoring after the infusion.     Should you develop worsening shortness of breath, chest pain or severe breathing problems, please do not wait for this appointment and go to the emergency room for evaluation and treatment instead. You will undergo another oxygen screen before your infusion to ensure this is the best treatment option for you. There is a chance that the best decision may be to send you to the emergency room for evaluation at the time of your appointment.    The day of your visit, you should: Marland Kitchen Get plenty of rest the night before and drink plenty of water. . Eat a light meal/snack before coming and take your medications as prescribed.  . Wear warm, comfortable clothes with a shirt that can roll-up over the elbow (will need IV start).  . Wear a mask.  . Consider bringing an activity to help pass the time.     Regards,  Mauricio Po

## 2021-01-02 ENCOUNTER — Ambulatory Visit (HOSPITAL_COMMUNITY)
Admission: RE | Admit: 2021-01-02 | Discharge: 2021-01-02 | Disposition: A | Discharge status: Home / Self Care | Payer: BC Managed Care – PPO | Source: Ambulatory Visit | Attending: Pulmonary Disease | Admitting: Pulmonary Disease

## 2021-01-02 DIAGNOSIS — Z6836 Body mass index (BMI) 36.0-36.9, adult: Secondary | ICD-10-CM | POA: Insufficient documentation

## 2021-01-02 DIAGNOSIS — F17200 Nicotine dependence, unspecified, uncomplicated: Secondary | ICD-10-CM | POA: Insufficient documentation

## 2021-01-02 DIAGNOSIS — E669 Obesity, unspecified: Secondary | ICD-10-CM | POA: Diagnosis not present

## 2021-01-02 DIAGNOSIS — U071 COVID-19: Secondary | ICD-10-CM | POA: Diagnosis present

## 2021-01-02 DIAGNOSIS — J4520 Mild intermittent asthma, uncomplicated: Secondary | ICD-10-CM | POA: Diagnosis not present

## 2021-01-02 MED ORDER — FAMOTIDINE IN NACL 20-0.9 MG/50ML-% IV SOLN: 20.0000 mg | Freq: Once | INTRAVENOUS | Status: DC | PRN

## 2021-01-02 MED ORDER — METHYLPREDNISOLONE SODIUM SUCC 125 MG IJ SOLR: 125.0000 mg | Freq: Once | INTRAMUSCULAR | Status: DC | PRN

## 2021-01-02 MED ORDER — SOTROVIMAB 500 MG/8ML IV SOLN
500.0000 mg | Freq: Once | INTRAVENOUS | Status: AC
  Administered 2021-01-02: 500 mg via INTRAVENOUS

## 2021-01-02 MED ORDER — EPINEPHRINE 0.3 MG/0.3ML IJ SOAJ: 0.3000 mg | Freq: Once | INTRAMUSCULAR | Status: DC | PRN

## 2021-01-02 MED ORDER — SODIUM CHLORIDE 0.9 % IV SOLN: INTRAVENOUS | Status: DC | PRN

## 2021-01-02 MED ORDER — ALBUTEROL SULFATE HFA 108 (90 BASE) MCG/ACT IN AERS: 2.0000 | INHALATION_SPRAY | Freq: Once | RESPIRATORY_TRACT | Status: DC | PRN

## 2021-01-02 MED ORDER — DIPHENHYDRAMINE HCL 50 MG/ML IJ SOLN: 50.0000 mg | Freq: Once | INTRAMUSCULAR | Status: DC | PRN

## 2021-01-02 NOTE — Discharge Instructions (Signed)

## 2021-01-02 NOTE — Progress Notes (Signed)
Diagnosis: COVID-19  Physician: Dr. Patrick Wright  Procedure: Covid Infusion Clinic Med: Sotrovimab infusion - Provided patient with sotrovimab fact sheet for patients, parents, and caregivers prior to infusion.   Complications: No immediate complications noted  Discharge: Discharged home    

## 2021-01-02 NOTE — Progress Notes (Signed)
Patient reviewed Fact Sheet for Patients, Parents, and Caregivers for Emergency Use Authorization (EUA) of sotrovimab for the Treatment of Coronavirus. Patient also reviewed and is agreeable to the estimated cost of treatment. Patient is agreeable to proceed.   

## 2021-01-05 ENCOUNTER — Ambulatory Visit (HOSPITAL_COMMUNITY)
Admission: EM | Admit: 2021-01-05 | Discharge: 2021-01-05 | Disposition: A | Discharge status: Home / Self Care | Payer: BC Managed Care – PPO | Attending: Emergency Medicine | Admitting: Emergency Medicine

## 2021-01-05 ENCOUNTER — Encounter (HOSPITAL_COMMUNITY): Payer: Self-pay

## 2021-01-05 ENCOUNTER — Other Ambulatory Visit: Payer: Self-pay

## 2021-01-05 DIAGNOSIS — R42 Dizziness and giddiness: Secondary | ICD-10-CM

## 2021-01-05 MED ORDER — MECLIZINE HCL 12.5 MG PO TABS
12.5000 mg | ORAL_TABLET | Freq: Three times a day (TID) | ORAL | 0 refills | Status: DC | PRN
Start: 2021-01-05 — End: 2021-08-02

## 2021-01-05 NOTE — Discharge Instructions (Signed)
Can take Antivert three times a day as needed for dizziness  Can continue use of OTC medications to help with headache, congestion and sore throat  Follow up for persistent dizziness, vomiting, worsening headache, shortness of breath, chest pain

## 2021-01-05 NOTE — ED Triage Notes (Signed)
Pt presents with loss of balance and weakness x 1 week. Pt states she has stomach pain and blurred vision.

## 2021-01-05 NOTE — ED Provider Notes (Signed)
[facility]    CSN: [id number] Arrival date & time: 01/05/21  4098      History   Chief Complaint Chief Complaint  Patient presents with  . Headache  . Blurred Vision  . Weakness    HPI Greer R Balsley is a 45 y.o. female.   Patient presents with dizziness and weakness for the past week. Feels like she is spinning, unbalanced when walking, intermittent nausea worsened with movement. Associated double vision, cough and congestion, intermittent frontal headache relieved by tylenol. COVID positive on 2/26, symptoms present at that time. Unvaccinated.   Past Medical History:  Diagnosis Date  . Anxiety   . Asthma     Patient Active Problem List   Diagnosis Date Noted  . Rosacea 09/06/2015  . Abscess, umbilical 11/91/4782  . Post op infection 07/15/2015  . Subcutaneous abscess 07/14/2015  . Dermoid cyst of left ovary   . Pelvic adhesions   . Amenorrhea 03/23/2013  . Bipolar disorder ([place]) 03/03/2012  . Sinus infection 12/20/2011  . Callus of foot 01/12/2011  . PAP SMER CERV W/HI GRADE SQUAMOUS INTRAEPITH LES 10/19/2010  . IBS 07/09/2008  . IRREGULAR MENSTRUAL CYCLE 09/19/2007  . ANXIETY STATE NOS 01/10/2007  . OBESITY, NOS 01/02/2007  . TOBACCO DEPENDENCE 01/02/2007  . RHINITIS, ALLERGIC 01/02/2007  . ASTHMA, PERSISTENT 01/02/2007  . CERVICAL DYSPLASIA 01/02/2007    Past Surgical History:  Procedure Laterality Date  . CHOLECYSTECTOMY    . INCISION AND DRAINAGE ABSCESS N/A 07/14/2015   Procedure: INCISION AND DRAINAGE UMBILICAL ABSCESS;  Surgeon: Guss Bunde, MD;  Location: [place];  Service: Gynecology;  Laterality: N/A;  . LAPAROSCOPIC UNILATERAL SALPINGO OOPHERECTOMY Left 06/28/2015   Procedure: LAPAROSCOPIC LEFT SALPINGO OOPHORECTOMY;  Surgeon: Mora Bellman, MD;  Location: [facility];  Service: Gynecology;  Laterality: Left;  . TUBAL LIGATION      OB History    Gravida  5   Para  4   Term  4   Preterm      AB  1   Living  4     SAB  1    IAB      Ectopic      Multiple      Live Births               Home Medications    Prior to Admission medications   Medication Sig Start Date End Date Taking? Authorizing Provider  acetaminophen (TYLENOL) 500 MG tablet Take 500 mg by mouth every 6 (six) hours as needed.    [provider]  albuterol (VENTOLIN HFA) 108 (90 Base) MCG/ACT inhaler Inhale 1-2 puffs into the lungs every 6 (six) hours as needed for wheezing or shortness of breath. 12/31/20   Hazel Sams, PA-C  benzonatate (TESSALON) 100 MG capsule Take 1-2 capsules (100-200 mg total) by mouth 3 (three) times daily as needed. 09/06/20   Jaynee Eagles, PA-C  caffeine 200 MG TABS tablet Take 200 mg by mouth every 4 (four) hours as needed.    [provider]  cetirizine (ZYRTEC) 10 MG tablet Take 10 mg by mouth daily.    [provider]  doxycycline (VIBRAMYCIN) 100 MG capsule Take 1 capsule (100 mg total) by mouth 2 (two) times daily. 09/06/20   Jaynee Eagles, PA-C  fluticasone (FLONASE) 50 MCG/ACT nasal spray Place 1 spray into both nostrils daily.    [provider]  ibuprofen (ADVIL,MOTRIN) 600 MG tablet Take 1 tablet (600 mg total) by mouth  every 6 (six) hours as needed for moderate pain. 07/18/15   Truett Mainland, DO  ondansetron (ZOFRAN) 8 MG tablet Take 1 tablet (8 mg total) by mouth every 8 (eight) hours as needed for nausea or vomiting. 12/31/20   Hazel Sams, PA-C  ondansetron (ZOFRAN-ODT) 4 MG disintegrating tablet Take 1 tablet (4 mg total) by mouth every 8 (eight) hours as needed for nausea or vomiting. 06/08/20   Zigmund Gottron, NP  permethrin (ELIMITE) 5 % cream Apply to affected area once, apply head toe and then leave on for 8 hours and wash off. Repeat in 2 weeks if needed. 60 07/08/19   Loura Halt A, NP  predniSONE (DELTASONE) 20 MG tablet Take 2 tablets daily with breakfast. 09/06/20   Jaynee Eagles, PA-C  Prenatal Vit-Fe Fumarate-FA (PRENATAL MULTIVITAMIN) TABS tablet Take 1  tablet by mouth daily at 12 noon.    [provider]  promethazine-dextromethorphan (PROMETHAZINE-DM) 6.25-15 MG/5ML syrup Take 5 mLs by mouth at bedtime as needed for cough. 09/06/20   Jaynee Eagles, PA-C  triamcinolone cream (KENALOG) 0.1 % Apply 1 application topically 2 (two) times daily. 07/08/19   Orvan July, NP    Family History Family History  Problem Relation Age of Onset  . Depression Mother   . Heart disease Father   . Cancer Maternal Grandmother        breast    Social History Social History   Tobacco Use  . Smoking status: Current Every Day Smoker    Packs/day: 1.00    Types: Cigarettes  . Smokeless tobacco: Never Used  Vaping Use  . Vaping Use: Former  Substance Use Topics  . Alcohol use: No    Alcohol/week: 0.0 standard drinks  . Drug use: No    Comment: uses E-cigs without the nicotine     Allergies   Amoxicillin, Erythromycin, Penicillins, and Latex   Review of Systems Review of Systems  Constitutional: Negative.   HENT: Positive for congestion, rhinorrhea and sore throat. Negative for dental problem, drooling, ear discharge, ear pain, facial swelling, hearing loss, mouth sores, nosebleeds, postnasal drip, sinus pressure, sinus pain, sneezing, tinnitus, trouble swallowing and voice change.   Eyes: Negative.   Respiratory: Positive for cough. Negative for apnea, choking, chest tightness, shortness of breath, wheezing and stridor.   Cardiovascular: Negative.   Gastrointestinal: Positive for nausea. Negative for abdominal distention, abdominal pain, anal bleeding, blood in stool, constipation, diarrhea, rectal pain and vomiting.  Musculoskeletal: Negative.   Skin: Negative.   Neurological: Positive for dizziness and headaches. Negative for tremors, seizures, syncope, facial asymmetry, speech difficulty, weakness, light-headedness and numbness.     Physical Exam Triage Vital Signs ED Triage Vitals  Enc Vitals Group     BP 01/05/21 1015 109/63      Pulse Rate 01/05/21 1015 75     Resp 01/05/21 1015 17     Temp 01/05/21 1015 98.5 F (36.9 C)     Temp Source 01/05/21 1015 Oral     SpO2 01/05/21 1015 98 %     Weight --      Height --      Head Circumference --      Peak Flow --      Pain Score 01/05/21 1013 0     Pain Loc --      Pain Edu? --      Excl. in [place]? --    No data found.  Updated Vital Signs BP 109/63 (BP Location:  Right Arm)   Pulse 75   Temp 98.5 F (36.9 C) (Oral)   Resp 17   LMP  (LMP Unknown)   SpO2 98%   Visual Acuity Right Eye Distance:   Left Eye Distance:   Bilateral Distance:    Right Eye Near:   Left Eye Near:    Bilateral Near:     Physical Exam Constitutional:      Appearance: She is well-developed and normal weight.  HENT:     Head: Normocephalic.     Mouth/Throat:     Mouth: Mucous membranes are moist.     Pharynx: Oropharynx is clear.  Eyes:     General: No visual field deficit.    Extraocular Movements: Extraocular movements intact.     Pupils: Pupils are equal, round, and reactive to light.  Cardiovascular:     Rate and Rhythm: Normal rate and regular rhythm.     Heart sounds: Normal heart sounds.  Pulmonary:     Effort: Pulmonary effort is normal.     Breath sounds: Normal breath sounds.  Abdominal:     General: Bowel sounds are normal.     Palpations: Abdomen is soft.  Musculoskeletal:     Cervical back: Normal range of motion.  Lymphadenopathy:     Cervical: Cervical adenopathy present.  Skin:    General: Skin is warm and dry.  Neurological:     Mental Status: She is alert and oriented to person, place, and time.     Cranial Nerves: No cranial nerve deficit or facial asymmetry.     Sensory: No sensory deficit.     Motor: No weakness.     Coordination: Romberg sign negative. Coordination normal.  Psychiatric:        Mood and Affect: Mood normal.        Speech: Speech normal.        Behavior: Behavior normal.      UC Treatments / Results  Labs (all labs  ordered are listed, but only abnormal results are displayed) Labs Reviewed - No data to display  EKG   Radiology No results found.  Procedures Procedures (including critical care time)  Medications Ordered in UC Medications - No data to display  Initial Impression / Assessment and Plan / UC Course  I have reviewed the triage vital signs and the nursing notes.  Pertinent labs & imaging results that were available during my care of the patient were reviewed by me and considered in my medical decision making (see chart for details).  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   1. Meclizine 12.5 mg tid as needed  2. Follow up precautions discussed with patient  3. Continue use of OTC medications for management of remaining symptoms  Final Clinical Impressions(s) / UC Diagnoses   Final diagnoses:  None   Discharge Instructions   None    ED Prescriptions    None     PDMP not reviewed this encounter.   Hans Eden, NP 01/05/21 1056

## 2021-04-23 ENCOUNTER — Encounter (HOSPITAL_COMMUNITY): Payer: Self-pay

## 2021-04-23 ENCOUNTER — Ambulatory Visit (HOSPITAL_COMMUNITY)
Admission: EM | Admit: 2021-04-23 | Discharge: 2021-04-23 | Disposition: A | Discharge status: Home / Self Care | Payer: BC Managed Care – PPO | Attending: Physician Assistant | Admitting: Physician Assistant

## 2021-04-23 ENCOUNTER — Other Ambulatory Visit: Payer: Self-pay

## 2021-04-23 DIAGNOSIS — B3731 Acute candidiasis of vulva and vagina: Secondary | ICD-10-CM

## 2021-04-23 DIAGNOSIS — B373 Candidiasis of vulva and vagina: Secondary | ICD-10-CM

## 2021-04-23 DIAGNOSIS — N760 Acute vaginitis: Secondary | ICD-10-CM | POA: Diagnosis present

## 2021-04-23 LAB — POC URINE PREG, ED: Preg Test, Ur: NEGATIVE

## 2021-04-23 LAB — POCT URINALYSIS DIPSTICK, ED / UC
Bilirubin Urine: NEGATIVE
Glucose, UA: NEGATIVE mg/dL
Hgb urine dipstick: NEGATIVE
Ketones, ur: NEGATIVE mg/dL
Nitrite: NEGATIVE
Protein, ur: NEGATIVE mg/dL
Specific Gravity, Urine: 1.01 (ref 1.005–1.030)
Urobilinogen, UA: 0.2 mg/dL (ref 0.0–1.0)
pH: 5.5 (ref 5.0–8.0)

## 2021-04-23 MED ORDER — FLUCONAZOLE 150 MG PO TABS: 150.0000 mg | ORAL_TABLET | ORAL | 0 refills | Status: DC | PRN

## 2021-04-23 NOTE — ED Provider Notes (Signed)
[facility]    CSN: [id number] Arrival date & time: 04/23/21  1511      History   Chief Complaint Chief Complaint  Patient presents with   Vaginal Discharge    HPI Sara Booth is a 45 y.o. female.   Patient presents today with a several month history of intermittent vaginal discharge and irritation.  She describes discharge as thick and white and irritation as primarily pruritus.  She denies any changes to personal hygiene products including soaps, detergents, underwear.  She denies any recent medication changes or antibiotics.  Denies history of diabetes and does not use SGLT2 inhibitor.  She has tried several over-the-counter medications including Monistat vaginal cell with only temporary relief of symptoms.  Has a history of recurrent yeast infections and states current symptoms are similar to previous episodes of this condition.  She has no concern for STI but is open to testing today.  She denies additional symptoms including abdominal pain, pelvic pain, fever, nausea, vomiting.   Past Medical History:  Diagnosis Date   Anxiety    Asthma     Patient Active Problem List   Diagnosis Date Noted   Rosacea 21/30/8657   Abscess, umbilical 84/69/6295   Post op infection 07/15/2015   Subcutaneous abscess 07/14/2015   Dermoid cyst of left ovary    Pelvic adhesions    Amenorrhea 03/23/2013   Bipolar disorder ([place]) 03/03/2012   Sinus infection 12/20/2011   Callus of foot 01/12/2011   PAP SMER CERV W/HI GRADE SQUAMOUS INTRAEPITH LES 10/19/2010   IBS 07/09/2008   IRREGULAR MENSTRUAL CYCLE 09/19/2007   ANXIETY STATE NOS 01/10/2007   OBESITY, NOS 01/02/2007   TOBACCO DEPENDENCE 01/02/2007   RHINITIS, ALLERGIC 01/02/2007   ASTHMA, PERSISTENT 01/02/2007   CERVICAL DYSPLASIA 01/02/2007    Past Surgical History:  Procedure Laterality Date   CHOLECYSTECTOMY     INCISION AND DRAINAGE ABSCESS N/A 07/14/2015   Procedure: INCISION AND DRAINAGE UMBILICAL ABSCESS;   Surgeon: Guss Bunde, MD;  Location: [place];  Service: Gynecology;  Laterality: N/A;   LAPAROSCOPIC UNILATERAL SALPINGO OOPHERECTOMY Left 06/28/2015   Procedure: LAPAROSCOPIC LEFT SALPINGO OOPHORECTOMY;  Surgeon: Mora Bellman, MD;  Location: [facility];  Service: Gynecology;  Laterality: Left;   TUBAL LIGATION      OB History     Gravida  5   Para  4   Term  4   Preterm      AB  1   Living  4      SAB  1   IAB      Ectopic      Multiple      Live Births               Home Medications    Prior to Admission medications   Medication Sig Start Date End Date Taking? Authorizing Provider  fluconazole (DIFLUCAN) 150 MG tablet Take 1 tablet (150 mg total) by mouth every 3 (three) days as needed for up to 3 doses. 04/23/21  Yes Lorice Lafave, Derry Skill, PA-C  acetaminophen (TYLENOL) 500 MG tablet Take 500 mg by mouth every 6 (six) hours as needed.    [provider]  albuterol (VENTOLIN HFA) 108 (90 Base) MCG/ACT inhaler Inhale 1-2 puffs into the lungs every 6 (six) hours as needed for wheezing or shortness of breath. 12/31/20   Hazel Sams, PA-C  benzonatate (TESSALON) 100 MG capsule Take 1-2 capsules (100-200 mg total) by mouth 3 (three) times daily as  needed. 09/06/20   Jaynee Eagles, PA-C  caffeine 200 MG TABS tablet Take 200 mg by mouth every 4 (four) hours as needed.    [provider]  cetirizine (ZYRTEC) 10 MG tablet Take 10 mg by mouth daily.    [provider]  doxycycline (VIBRAMYCIN) 100 MG capsule Take 1 capsule (100 mg total) by mouth 2 (two) times daily. 09/06/20   Jaynee Eagles, PA-C  fluticasone (FLONASE) 50 MCG/ACT nasal spray Place 1 spray into both nostrils daily.    [provider]  ibuprofen (ADVIL,MOTRIN) 600 MG tablet Take 1 tablet (600 mg total) by mouth every 6 (six) hours as needed for moderate pain. 07/18/15   Truett Mainland, DO  meclizine (ANTIVERT) 12.5 MG tablet Take 1 tablet (12.5 mg total) by mouth 3 (three) times daily  as needed for dizziness. 01/05/21   White, Leitha Schuller, NP  ondansetron (ZOFRAN) 8 MG tablet Take 1 tablet (8 mg total) by mouth every 8 (eight) hours as needed for nausea or vomiting. 12/31/20   Hazel Sams, PA-C  ondansetron (ZOFRAN-ODT) 4 MG disintegrating tablet Take 1 tablet (4 mg total) by mouth every 8 (eight) hours as needed for nausea or vomiting. 06/08/20   Zigmund Gottron, NP  permethrin (ELIMITE) 5 % cream Apply to affected area once, apply head toe and then leave on for 8 hours and wash off. Repeat in 2 weeks if needed. 60 07/08/19   Loura Halt A, NP  predniSONE (DELTASONE) 20 MG tablet Take 2 tablets daily with breakfast. 09/06/20   Jaynee Eagles, PA-C  Prenatal Vit-Fe Fumarate-FA (PRENATAL MULTIVITAMIN) TABS tablet Take 1 tablet by mouth daily at 12 noon.    [provider]  promethazine-dextromethorphan (PROMETHAZINE-DM) 6.25-15 MG/5ML syrup Take 5 mLs by mouth at bedtime as needed for cough. 09/06/20   Jaynee Eagles, PA-C  triamcinolone cream (KENALOG) 0.1 % Apply 1 application topically 2 (two) times daily. 07/08/19   Orvan July, NP    Family History Family History  Problem Relation Age of Onset   Depression Mother    Heart disease Father    Cancer Maternal Grandmother        breast    Social History Social History   Tobacco Use   Smoking status: Every Day    Packs/day: 1.00    Pack years: 0.00    Types: Cigarettes   Smokeless tobacco: Never  Vaping Use   Vaping Use: Former  Substance Use Topics   Alcohol use: No    Alcohol/week: 0.0 standard drinks   Drug use: No    Comment: uses E-cigs without the nicotine     Allergies   Amoxicillin, Erythromycin, Penicillins, and Latex   Review of Systems Review of Systems  Constitutional:  Negative for activity change, appetite change, fatigue and fever.  Respiratory:  Negative for cough and shortness of breath.   Cardiovascular:  Negative for chest pain.  Gastrointestinal:  Negative for abdominal pain,  diarrhea, nausea and vomiting.  Genitourinary:  Positive for vaginal discharge. Negative for dysuria, frequency, urgency, vaginal bleeding and vaginal pain.  Neurological:  Negative for dizziness, light-headedness and headaches.    Physical Exam Triage Vital Signs ED Triage Vitals  Enc Vitals Group     BP 04/23/21 1635 126/87     Pulse Rate 04/23/21 1635 69     Resp 04/23/21 1635 18     Temp 04/23/21 1635 98.6 F (37 C)     Temp src --  SpO2 04/23/21 1635 97 %     Weight --      Height --      Head Circumference --      Peak Flow --      Pain Score 04/23/21 1632 0     Pain Loc --      Pain Edu? --      Excl. in [place]? --    No data found.  Updated Vital Signs BP 126/87 (BP Location: Left Arm)   Pulse 69   Temp 98.6 F (37 C)   Resp 18   LMP  (Within Weeks) Comment: 2 weeks  SpO2 97%   Visual Acuity Right Eye Distance:   Left Eye Distance:   Bilateral Distance:    Right Eye Near:   Left Eye Near:    Bilateral Near:     Physical Exam Vitals reviewed.  Constitutional:      General: She is awake. She is not in acute distress.    Appearance: Normal appearance. She is normal weight. She is not ill-appearing.     Comments: Very pleasant female appears stated age in no acute distress  HENT:     Head: Normocephalic and atraumatic.  Cardiovascular:     Rate and Rhythm: Normal rate and regular rhythm.     Heart sounds: Normal heart sounds, S1 normal and S2 normal. No murmur heard. Pulmonary:     Effort: Pulmonary effort is normal.     Breath sounds: Normal breath sounds. No wheezing, rhonchi or rales.     Comments: Clear to auscultation bilaterally Abdominal:     General: Bowel sounds are normal.     Palpations: Abdomen is soft.     Tenderness: There is no abdominal tenderness. There is no right CVA tenderness, left CVA tenderness, guarding or rebound.     Comments: Benign abdominal exam  Genitourinary:    Comments: Exam deferred Psychiatric:        Behavior:  Behavior is cooperative.     UC Treatments / Results  Labs (all labs ordered are listed, but only abnormal results are displayed) Labs Reviewed  POCT URINALYSIS DIPSTICK, ED / UC - Abnormal; Notable for the following components:      Result Value   Leukocytes,Ua TRACE (*)    All other components within normal limits  URINE CULTURE  POC URINE PREG, ED  CERVICOVAGINAL ANCILLARY ONLY    EKG   Radiology No results found.  Procedures Procedures (including critical care time)  Medications Ordered in UC Medications - No data to display  Initial Impression / Assessment and Plan / UC Course  I have reviewed the triage vital signs and the nursing notes.  Pertinent labs & imaging results that were available during my care of the patient were reviewed by me and considered in my medical decision making (see chart for details).      Urine pregnancy was negative in clinic today.  UA showed trace leukocyte Estrace likely related to vulvovaginitis.  Will obtain urine culture to ensure no UTI but defer additional antibiotic use until culture results are obtained.  Given clinical presentation patient was started on Diflucan with instructions take 1 tablet every 3 days until symptoms resolve or she runs out of medication.  STI swab was collected today-results pending.  We will determine additional treatment based on laboratory results.  She was encouraged to wear loosefitting cotton underwear and use relative soaps or detergents.  Discussed alarm symptoms that warrant emergent evaluation.  If symptoms  persist she should follow-up with OB/GYN was given contact information for local group.  Strict return precautions given to which patient expressed understanding.  Final Clinical Impressions(s) / UC Diagnoses   Final diagnoses:  Vaginitis and vulvovaginitis  Vaginal yeast infection     Discharge Instructions      We are going to treat you for a yeast infection given what we talked about.   Please take 1 Diflucan every 3 days until symptoms resolve or you run out of medication.  If your symptoms do not improve with this medication please return for reevaluation.  We will send off your urine for culture to make sure there is no infection.  If we do need to start any antibiotics based on your urine culture or your other swab we will contact you.  Wear loosefitting cotton underwear and use hypoallergenic soaps and detergents.  If anything worsens please return for reevaluation.     ED Prescriptions     Medication Sig Dispense Auth. Provider   fluconazole (DIFLUCAN) 150 MG tablet Take 1 tablet (150 mg total) by mouth every 3 (three) days as needed for up to 3 doses. 3 tablet Samaiya Awadallah, Derry Skill, PA-C      PDMP not reviewed this encounter.   Terrilee Croak, PA-C 04/23/21 1708

## 2021-04-23 NOTE — ED Triage Notes (Signed)
Pt reports white vaginal discharge and itchy since 01/2021, abdominal cramps x 1 day. Monistat and Vagisil gives no relief.

## 2021-04-23 NOTE — Discharge Instructions (Signed)
We are going to treat you for a yeast infection given what we talked about.  Please take 1 Diflucan every 3 days until symptoms resolve or you run out of medication.  If your symptoms do not improve with this medication please return for reevaluation.  We will send off your urine for culture to make sure there is no infection.  If we do need to start any antibiotics based on your urine culture or your other swab we will contact you.  Wear loosefitting cotton underwear and use hypoallergenic soaps and detergents.  If anything worsens please return for reevaluation.

## 2021-04-25 ENCOUNTER — Ambulatory Visit (HOSPITAL_COMMUNITY)
Admission: EM | Admit: 2021-04-25 | Discharge: 2021-04-25 | Disposition: A | Discharge status: Home / Self Care | Payer: BC Managed Care – PPO | Attending: Urgent Care | Admitting: Urgent Care

## 2021-04-25 ENCOUNTER — Other Ambulatory Visit: Payer: Self-pay

## 2021-04-25 ENCOUNTER — Encounter (HOSPITAL_COMMUNITY): Payer: Self-pay

## 2021-04-25 DIAGNOSIS — A599 Trichomoniasis, unspecified: Secondary | ICD-10-CM

## 2021-04-25 DIAGNOSIS — R059 Cough, unspecified: Secondary | ICD-10-CM

## 2021-04-25 DIAGNOSIS — B9689 Other specified bacterial agents as the cause of diseases classified elsewhere: Secondary | ICD-10-CM

## 2021-04-25 DIAGNOSIS — J3089 Other allergic rhinitis: Secondary | ICD-10-CM

## 2021-04-25 DIAGNOSIS — R0981 Nasal congestion: Secondary | ICD-10-CM

## 2021-04-25 DIAGNOSIS — R0789 Other chest pain: Secondary | ICD-10-CM

## 2021-04-25 DIAGNOSIS — J3489 Other specified disorders of nose and nasal sinuses: Secondary | ICD-10-CM

## 2021-04-25 DIAGNOSIS — F17200 Nicotine dependence, unspecified, uncomplicated: Secondary | ICD-10-CM

## 2021-04-25 LAB — CERVICOVAGINAL ANCILLARY ONLY
Bacterial Vaginitis (gardnerella): POSITIVE — AB
Candida Glabrata: NEGATIVE
Candida Vaginitis: NEGATIVE
Chlamydia: NEGATIVE
Comment: NEGATIVE
Comment: NEGATIVE
Comment: NEGATIVE
Comment: NEGATIVE
Comment: NEGATIVE
Comment: NORMAL
Neisseria Gonorrhea: NEGATIVE
Trichomonas: POSITIVE — AB

## 2021-04-25 LAB — URINE CULTURE: Culture: 30000 — AB

## 2021-04-25 MED ORDER — BENZONATATE 100 MG PO CAPS: 100.0000 mg | ORAL_CAPSULE | Freq: Three times a day (TID) | ORAL | 0 refills | Status: DC | PRN

## 2021-04-25 MED ORDER — METRONIDAZOLE 500 MG PO TABS: 500.0000 mg | ORAL_TABLET | Freq: Two times a day (BID) | ORAL | 0 refills | Status: DC

## 2021-04-25 MED ORDER — PREDNISONE 20 MG PO TABS: ORAL_TABLET | ORAL | 0 refills | Status: DC

## 2021-04-25 MED ORDER — PROMETHAZINE-DM 6.25-15 MG/5ML PO SYRP: 5.0000 mL | ORAL_SOLUTION | Freq: Every evening | ORAL | 0 refills | Status: DC | PRN

## 2021-04-25 NOTE — Discharge Instructions (Addendum)
Avoid all forms of sexual intercourse (oral, vaginal, anal) for the next 7 days to avoid spreading/reinfecting. Return if symptoms worsen/do not resolve, you develop fever, abdominal pain, blood in your urine, or are re-exposed to an STI.  

## 2021-04-25 NOTE — ED Triage Notes (Signed)
Pt presents with non productive cough, nasal drainage, and sinus headache X 1 week.

## 2021-04-25 NOTE — ED Provider Notes (Signed)
[place]   MRN: [id number] DOB: [birth year]  Subjective:   Sara Booth is a 45 y.o. female presenting for 3-day history of acute onset recurrent sinus headaches, sinus congestion and pressure, dry cough, bilateral ear fullness and popping.  Patient has a history of allergic rhinitis but is not using her medications.  She is also smoking 1/2-1ppd.  She did have a visit on 04/23/2021, states that her symptoms started after that visit.  Would like lab review from said visit.  No current facility-administered medications for this encounter.  Current Outpatient Medications:    acetaminophen (TYLENOL) 500 MG tablet, Take 500 mg by mouth every 6 (six) hours as needed., Disp: , Rfl:    albuterol (VENTOLIN HFA) 108 (90 Base) MCG/ACT inhaler, Inhale 1-2 puffs into the lungs every 6 (six) hours as needed for wheezing or shortness of breath., Disp: 1 each, Rfl: 0   benzonatate (TESSALON) 100 MG capsule, Take 1-2 capsules (100-200 mg total) by mouth 3 (three) times daily as needed., Disp: 60 capsule, Rfl: 0   caffeine 200 MG TABS tablet, Take 200 mg by mouth every 4 (four) hours as needed., Disp: , Rfl:    cetirizine (ZYRTEC) 10 MG tablet, Take 10 mg by mouth daily., Disp: , Rfl:    doxycycline (VIBRAMYCIN) 100 MG capsule, Take 1 capsule (100 mg total) by mouth 2 (two) times daily., Disp: 14 capsule, Rfl: 0   fluconazole (DIFLUCAN) 150 MG tablet, Take 1 tablet (150 mg total) by mouth every 3 (three) days as needed for up to 3 doses., Disp: 3 tablet, Rfl: 0   fluticasone (FLONASE) 50 MCG/ACT nasal spray, Place 1 spray into both nostrils daily., Disp: , Rfl:    ibuprofen (ADVIL,MOTRIN) 600 MG tablet, Take 1 tablet (600 mg total) by mouth every 6 (six) hours as needed for moderate pain., Disp: 30 tablet, Rfl: 2   meclizine (ANTIVERT) 12.5 MG tablet, Take 1 tablet (12.5 mg total) by mouth 3 (three) times daily as needed for dizziness., Disp: 30 tablet, Rfl: 0   ondansetron (ZOFRAN) 8 MG  tablet, Take 1 tablet (8 mg total) by mouth every 8 (eight) hours as needed for nausea or vomiting., Disp: 20 tablet, Rfl: 0   ondansetron (ZOFRAN-ODT) 4 MG disintegrating tablet, Take 1 tablet (4 mg total) by mouth every 8 (eight) hours as needed for nausea or vomiting., Disp: 12 tablet, Rfl: 0   permethrin (ELIMITE) 5 % cream, Apply to affected area once, apply head toe and then leave on for 8 hours and wash off. Repeat in 2 weeks if needed. 60, Disp: 60 g, Rfl: 1   predniSONE (DELTASONE) 20 MG tablet, Take 2 tablets daily with breakfast., Disp: 10 tablet, Rfl: 0   Prenatal Vit-Fe Fumarate-FA (PRENATAL MULTIVITAMIN) TABS tablet, Take 1 tablet by mouth daily at 12 noon., Disp: , Rfl:    promethazine-dextromethorphan (PROMETHAZINE-DM) 6.25-15 MG/5ML syrup, Take 5 mLs by mouth at bedtime as needed for cough., Disp: 100 mL, Rfl: 0   triamcinolone cream (KENALOG) 0.1 %, Apply 1 application topically 2 (two) times daily., Disp: 30 g, Rfl: 0   Allergies  Allergen Reactions   Amoxicillin Other (See Comments)    Made her very hot and her lips turned blue. She also had frequent urination.   Erythromycin     Was told not to take it in the hospital   Penicillins Other (See Comments)    She can take any cillins    Correction  Pt  can  Not take  cillens per pt   Latex Rash    Past Medical History:  Diagnosis Date   Anxiety    Asthma      Past Surgical History:  Procedure Laterality Date   CHOLECYSTECTOMY     INCISION AND DRAINAGE ABSCESS N/A 07/14/2015   Procedure: INCISION AND DRAINAGE UMBILICAL ABSCESS;  Surgeon: Guss Bunde, MD;  Location: [facility];  Service: Gynecology;  Laterality: N/A;   LAPAROSCOPIC UNILATERAL SALPINGO OOPHERECTOMY Left 06/28/2015   Procedure: LAPAROSCOPIC LEFT SALPINGO OOPHORECTOMY;  Surgeon: Mora Bellman, MD;  Location: [facility];  Service: Gynecology;  Laterality: Left;   TUBAL LIGATION      Family History  Problem Relation Age of Onset   Depression Mother    Heart  disease Father    Cancer Maternal Grandmother        breast    Social History   Tobacco Use   Smoking status: Every Day    Packs/day: 1.00    Pack years: 0.00    Types: Cigarettes   Smokeless tobacco: Never  Vaping Use   Vaping Use: Former  Substance Use Topics   Alcohol use: No    Alcohol/week: 0.0 standard drinks   Drug use: No    Comment: uses E-cigs without the nicotine    ROS   Objective:   Vitals: BP 131/74 (BP Location: Right Arm)   Pulse 81   Temp 98.3 F (36.8 C) (Oral)   Resp 17   LMP 04/17/2021   SpO2 97%   Physical Exam Constitutional:      General: She is not in acute distress.    Appearance: Normal appearance. She is well-developed and normal weight. She is not ill-appearing, toxic-appearing or diaphoretic.  HENT:     Head: Normocephalic and atraumatic.     Right Ear: External ear normal.     Left Ear: External ear normal.     Nose: Nose normal.     Mouth/Throat:     Mouth: Mucous membranes are moist.     Pharynx: Oropharynx is clear.  Eyes:     General: No scleral icterus.    Extraocular Movements: Extraocular movements intact.     Pupils: Pupils are equal, round, and reactive to light.  Cardiovascular:     Rate and Rhythm: Normal rate and regular rhythm.     Pulses: Normal pulses.     Heart sounds: Normal heart sounds. No murmur heard.   No friction rub. No gallop.  Pulmonary:     Effort: Pulmonary effort is normal. No respiratory distress.     Breath sounds: Normal breath sounds. No stridor. No wheezing, rhonchi or rales.  Abdominal:     General: Bowel sounds are normal. There is no distension.     Palpations: Abdomen is soft. There is no mass.     Tenderness: There is no abdominal tenderness. There is no right CVA tenderness, left CVA tenderness, guarding or rebound.  Skin:    General: Skin is warm and dry.     Coloration: Skin is not pale.     Findings: No rash.  Neurological:     General: No focal deficit present.     Mental  Status: She is alert and oriented to person, place, and time.  Psychiatric:        Mood and Affect: Mood normal.        Behavior: Behavior normal.        Thought Content: Thought content normal.  Judgment: Judgment normal.     Assessment and Plan :   PDMP not reviewed this encounter.  1. Allergic rhinitis due to other allergic trigger, unspecified seasonality   2. Sinus pressure   3. Sinus congestion   4. Cough   5. Atypical chest pain   6. Smoker   7. Trichomonas infection   8. Bacterial vaginosis     Performed lab reviewed with patient and informed her that she tested positive for bacterial vaginosis and trichomonas infections.  Recommended metronidazole for this.  Also will use prednisone course to address recurrent allergic rhinitis.  We will hold off on antibiotic use for now given the timeline of her symptoms and lack of sinus pain on exam.  Patient refused COVID-19 testing.  Encouraged patient to consider smoking cessation.  Follow-up with PCP.  Counseled patient on potential for adverse effects with medications prescribed/recommended today, ER and return-to-clinic precautions discussed, patient verbalized understanding.    Jaynee Eagles, PA-C 04/25/21 1446

## 2021-08-02 ENCOUNTER — Other Ambulatory Visit: Payer: Self-pay

## 2021-08-02 ENCOUNTER — Ambulatory Visit (HOSPITAL_COMMUNITY)
Admission: EM | Admit: 2021-08-02 | Discharge: 2021-08-02 | Disposition: A | Discharge status: Home / Self Care | Payer: BC Managed Care – PPO | Attending: Family Medicine | Admitting: Family Medicine

## 2021-08-02 DIAGNOSIS — Z881 Allergy status to other antibiotic agents status: Secondary | ICD-10-CM | POA: Insufficient documentation

## 2021-08-02 DIAGNOSIS — R059 Cough, unspecified: Secondary | ICD-10-CM | POA: Diagnosis present

## 2021-08-02 DIAGNOSIS — J069 Acute upper respiratory infection, unspecified: Secondary | ICD-10-CM | POA: Insufficient documentation

## 2021-08-02 DIAGNOSIS — Z880 Allergy status to penicillin: Secondary | ICD-10-CM | POA: Insufficient documentation

## 2021-08-02 DIAGNOSIS — Z20822 Contact with and (suspected) exposure to covid-19: Secondary | ICD-10-CM | POA: Diagnosis not present

## 2021-08-02 DIAGNOSIS — R000 Tachycardia, unspecified: Secondary | ICD-10-CM | POA: Diagnosis not present

## 2021-08-02 DIAGNOSIS — H8110 Benign paroxysmal vertigo, unspecified ear: Secondary | ICD-10-CM | POA: Insufficient documentation

## 2021-08-02 DIAGNOSIS — F17210 Nicotine dependence, cigarettes, uncomplicated: Secondary | ICD-10-CM | POA: Diagnosis not present

## 2021-08-02 LAB — POC INFLUENZA A AND B ANTIGEN (URGENT CARE ONLY)
INFLUENZA A ANTIGEN, POC: NEGATIVE
INFLUENZA B ANTIGEN, POC: NEGATIVE

## 2021-08-02 MED ORDER — ALBUTEROL SULFATE HFA 108 (90 BASE) MCG/ACT IN AERS: 1.0000 | INHALATION_SPRAY | Freq: Four times a day (QID) | RESPIRATORY_TRACT | 1 refills | Status: DC | PRN

## 2021-08-02 MED ORDER — PREDNISONE 20 MG PO TABS: 40.0000 mg | ORAL_TABLET | Freq: Every day | ORAL | 0 refills | Status: DC

## 2021-08-02 MED ORDER — MECLIZINE HCL 25 MG PO TABS: 25.0000 mg | ORAL_TABLET | Freq: Three times a day (TID) | ORAL | 0 refills | Status: DC | PRN

## 2021-08-02 NOTE — ED Triage Notes (Signed)
Pt HR 130 . Pt reports HR is 130 this morning. Pt reports she takes caffeine pill every day. Pt reports she had COVID in 01-2021. Pt has had a cough since COVID . Pt needs a refill on [facility] . Pt now has cough ,chills and SHOB.

## 2021-08-02 NOTE — Discharge Instructions (Signed)
You have been tested for COVID-19 today. °If your test returns positive, you will receive a phone call from [facility] regarding your results. °Negative test results are not called. °Both positive and negative results area always visible on MyChart. °If you do not have a MyChart account, sign up instructions are provided in your discharge papers. °Please do not hesitate to contact us should you have questions or concerns. ° °

## 2021-08-03 LAB — SARS CORONAVIRUS 2 (TAT 6-24 HRS): SARS Coronavirus 2: NEGATIVE

## 2021-08-03 NOTE — ED Provider Notes (Signed)
[place]   [id number] 08/02/21 Arrival Time: 1740  ASSESSMENT & PLAN:  1. Viral URI with cough   2. Benign paroxysmal positional vertigo, unspecified laterality    Discussed typical duration of suspected viral illness. Rapid flu negative. COVID-19 testing sent. OTC symptom care as needed. See AVS for d/c information.  Begin: Meds ordered this encounter  Medications   meclizine (ANTIVERT) 25 MG tablet    Sig: Take 1 tablet (25 mg total) by mouth 3 (three) times daily as needed for dizziness.    Dispense:  30 tablet    Refill:  0   albuterol (VENTOLIN HFA) 108 (90 Base) MCG/ACT inhaler    Sig: Inhale 1-2 puffs into the lungs every 6 (six) hours as needed for wheezing or shortness of breath.    Dispense:  1 each    Refill:  1   predniSONE (DELTASONE) 20 MG tablet    Sig: Take 2 tablets (40 mg total) by mouth daily.    Dispense:  10 tablet    Refill:  0     Follow-up Information     [facility] at [facility].   Specialty: Urgent Care Why: If worsening or failing to improve as anticipated. Contact information: [place] [phone]                Reviewed expectations re: course of current medical issues. Questions answered. Outlined signs and symptoms indicating need for more acute intervention. Understanding verbalized. After Visit Summary given.   SUBJECTIVE: History from: patient. Winnifred R Keasling is a 45 y.o. female who reports cough, chills, vertigo; couple of days. Vertigo with head movements. Does feel heart racing at times. No CP or specific SOB. Denies: fever. Normal PO intake without n/v/d. Does feel she is wheezing at times.  OBJECTIVE:  Vitals:   08/02/21 1136  BP: 140/66  Pulse: (!) 130  Resp: 20  Temp: 97.6 F (36.4 C)  SpO2: 99%    Recheck P: 112 General appearance: alert; no distress Eyes: PERRLA; EOMI; conjunctiva normal HENT: [place]; AT; with nasal congestion Neck:  supple  CV: reg Lungs: speaks full sentences without difficulty; unlabored; bilateral wheezing Extremities: no edema Skin: warm and dry Neurologic: normal gait; CN 2-12 grossly intact; reports vertigo with quick head movements Psychological: alert and cooperative; normal mood and affect  Labs:  Labs Reviewed  SARS CORONAVIRUS 2 (TAT 6-24 HRS)  POC INFLUENZA A AND B ANTIGEN (URGENT CARE ONLY)   Allergies  Allergen Reactions   Amoxicillin Other (See Comments)    Made her very hot and her lips turned blue. She also had frequent urination.   Erythromycin     Was told not to take it in the hospital   Penicillins Other (See Comments)    She can take any cillins    Correction  Pt can  Not take  cillens per pt   Latex Rash    Past Medical History:  Diagnosis Date   Anxiety    Asthma    Social History   Socioeconomic History   Marital status: Married    Spouse name: Not on file   Number of children: Not on file   Years of education: Not on file   Highest education level: Not on file  Occupational History   Not on file  Tobacco Use   Smoking status: Every Day    Packs/day: 1.00    Types: Cigarettes   Smokeless tobacco: Never  Vaping Use   Vaping Use: Former  Substance and Sexual Activity   Alcohol use: No    Alcohol/week: 0.0 standard drinks   Drug use: No    Comment: uses E-cigs without the nicotine   Sexual activity: Not Currently  Other Topics Concern   Not on file  Social History Narrative   Not on file   Social Determinants of Health   Financial Resource Strain: Not on file  Food Insecurity: Not on file  Transportation Needs: Not on file  Physical Activity: Not on file  Stress: Not on file  Social Connections: Not on file  Intimate Partner Violence: Not on file   Family History  Problem Relation Age of Onset   Depression Mother    Heart disease Father    Cancer Maternal Grandmother        breast   Past Surgical History:  Procedure Laterality Date    CHOLECYSTECTOMY     INCISION AND DRAINAGE ABSCESS N/A 07/14/2015   Procedure: INCISION AND DRAINAGE UMBILICAL ABSCESS;  Surgeon: Guss Bunde, MD;  Location: [facility];  Service: Gynecology;  Laterality: N/A;   LAPAROSCOPIC UNILATERAL SALPINGO OOPHERECTOMY Left 06/28/2015   Procedure: LAPAROSCOPIC LEFT SALPINGO OOPHORECTOMY;  Surgeon: Mora Bellman, MD;  Location: [facility];  Service: Gynecology;  Laterality: Left;   TUBAL LIGATION       Vanessa Kick, MD 08/03/21 1029

## 2021-11-10 ENCOUNTER — Other Ambulatory Visit: Payer: Self-pay

## 2021-11-10 ENCOUNTER — Encounter (HOSPITAL_COMMUNITY): Payer: Self-pay | Admitting: Emergency Medicine

## 2021-11-10 ENCOUNTER — Emergency Department (HOSPITAL_COMMUNITY)
Admission: EM | Admit: 2021-11-10 | Discharge: 2021-11-10 | Disposition: A | Discharge status: Home / Self Care | Payer: BC Managed Care – PPO | Attending: Emergency Medicine | Admitting: Emergency Medicine

## 2021-11-10 DIAGNOSIS — M79675 Pain in left toe(s): Secondary | ICD-10-CM | POA: Diagnosis present

## 2021-11-10 DIAGNOSIS — Z91040 Latex allergy status: Secondary | ICD-10-CM | POA: Diagnosis not present

## 2021-11-10 DIAGNOSIS — L03032 Cellulitis of left toe: Secondary | ICD-10-CM | POA: Insufficient documentation

## 2021-11-10 MED ORDER — DOXYCYCLINE HYCLATE 100 MG PO CAPS: 100.0000 mg | ORAL_CAPSULE | Freq: Two times a day (BID) | ORAL | 0 refills | Status: AC

## 2021-11-10 MED ORDER — LIDOCAINE HCL (PF) 1 % IJ SOLN
10.0000 mL | Freq: Once | INTRAMUSCULAR | Status: DC
  Filled 2021-11-10: qty 10

## 2021-11-10 NOTE — ED Provider Triage Note (Signed)
Emergency Medicine Provider Triage Evaluation Note  Sara Booth , a 46 y.o. female  was evaluated in triage.  Pt complains of ingrown toenail to left great toe.  Says she has been dealing with this for the last 2 months.  Pain became worse yesterday after she worked.    Review of Systems  Positive: Ingrown toenail, left great toe pain Negative: Fever, chills  Physical Exam  BP 121/70 (BP Location: Right Arm)    Pulse 67    Temp 98.5 F (36.9 C) (Oral)    Resp 17    SpO2 95%  Gen:   Awake, no distress   Resp:  Normal effort  MSK:   Moves extremities without difficulty  Other:  Ingrown toenail to left great toe.  Medical Decision Making  Medically screening exam initiated at 6:19 AM.  Appropriate orders placed.  Sara Booth was informed that the remainder of the evaluation will be completed by another provider, this initial triage assessment does not replace that evaluation, and the importance of remaining in the ED until their evaluation is complete.     Loni Beckwith, [place] 11/10/21 [phone]

## 2021-11-10 NOTE — ED Provider Notes (Signed)
[facility] EMERGENCY DEPARTMENT Provider Note   CSN: [id number] Arrival date & time: 11/10/21  0505     History  Chief Complaint  Patient presents with   Toe Pain    Sara Booth is a 46 y.o. female.   Toe Pain Pertinent negatives include no chest pain, no headaches and no shortness of breath.  Patient is complaining of of having L great toe pain. Achy constant and moderate.  No fever or chills. No NVD.   States pain for 1 month worse now.   States pain is achy and constant.     Home Medications Prior to Admission medications   Medication Sig Start Date End Date Taking? Authorizing Provider  doxycycline (VIBRAMYCIN) 100 MG capsule Take 1 capsule (100 mg total) by mouth 2 (two) times daily for 7 days. 11/10/21 11/17/21 Yes Aliah Eriksson S, PA  acetaminophen (TYLENOL) 500 MG tablet Take 500 mg by mouth every 6 (six) hours as needed.    [provider]  albuterol (VENTOLIN HFA) 108 (90 Base) MCG/ACT inhaler Inhale 1-2 puffs into the lungs every 6 (six) hours as needed for wheezing or shortness of breath. 08/02/21   Vanessa Kick, MD  benzonatate (TESSALON) 100 MG capsule Take 1-2 capsules (100-200 mg total) by mouth 3 (three) times daily as needed. 04/25/21   Jaynee Eagles, PA-C  caffeine 200 MG TABS tablet Take 200 mg by mouth every 4 (four) hours as needed.    [provider]  cetirizine (ZYRTEC) 10 MG tablet Take 10 mg by mouth daily.    [provider]  fluconazole (DIFLUCAN) 150 MG tablet Take 1 tablet (150 mg total) by mouth every 3 (three) days as needed for up to 3 doses. 04/23/21   Raspet, Junie Panning K, PA-C  fluticasone (FLONASE) 50 MCG/ACT nasal spray Place 1 spray into both nostrils daily.    [provider]  ibuprofen (ADVIL,MOTRIN) 600 MG tablet Take 1 tablet (600 mg total) by mouth every 6 (six) hours as needed for moderate pain. 07/18/15   Truett Mainland, DO  meclizine (ANTIVERT) 25 MG tablet Take 1 tablet (25 mg total) by  mouth 3 (three) times daily as needed for dizziness. 08/02/21   Vanessa Kick, MD  metroNIDAZOLE (FLAGYL) 500 MG tablet Take 1 tablet (500 mg total) by mouth 2 (two) times daily. 04/25/21   Jaynee Eagles, PA-C  ondansetron (ZOFRAN) 8 MG tablet Take 1 tablet (8 mg total) by mouth every 8 (eight) hours as needed for nausea or vomiting. 12/31/20   Hazel Sams, PA-C  ondansetron (ZOFRAN-ODT) 4 MG disintegrating tablet Take 1 tablet (4 mg total) by mouth every 8 (eight) hours as needed for nausea or vomiting. 06/08/20   Zigmund Gottron, NP  permethrin (ELIMITE) 5 % cream Apply to affected area once, apply head toe and then leave on for 8 hours and wash off. Repeat in 2 weeks if needed. 60 07/08/19   Bast, Traci A, NP  predniSONE (DELTASONE) 20 MG tablet Take 2 tablets (40 mg total) by mouth daily. 08/02/21   Vanessa Kick, MD  Prenatal Vit-Fe Fumarate-FA (PRENATAL MULTIVITAMIN) TABS tablet Take 1 tablet by mouth daily at 12 noon.    [provider]  triamcinolone cream (KENALOG) 0.1 % Apply 1 application topically 2 (two) times daily. 07/08/19   Loura Halt A, NP      Allergies    Amoxicillin, Erythromycin, Penicillins, and Latex    Review of Systems   Review of Systems  Constitutional:  Negative for fever.  HENT:  Negative for congestion.   Respiratory:  Negative for shortness of breath.   Cardiovascular:  Negative for chest pain.  Gastrointestinal:  Negative for abdominal distention.  Musculoskeletal:        Left toe pain  Neurological:  Negative for dizziness and headaches.   Physical Exam Updated Vital Signs BP 118/79 (BP Location: Right Arm)    Pulse 71    Temp 98.3 F (36.8 C) (Oral)    Resp 18    SpO2 97%  Physical Exam Vitals and nursing note reviewed.  Constitutional:      General: She is not in acute distress.    Appearance: Normal appearance. She is not ill-appearing.  HENT:     Head: Normocephalic and atraumatic.  Eyes:     General: No scleral icterus.       Right eye:  No discharge.        Left eye: No discharge.     Conjunctiva/sclera: Conjunctivae normal.  Pulmonary:     Effort: Pulmonary effort is normal.     Breath sounds: No stridor.  Skin:    General: Skin is warm and dry.     Comments: Paronychia of left great toe  Neurological:     Mental Status: She is alert and oriented to person, place, and time. Mental status is at baseline.    ED Results / Procedures / Treatments   Labs (all labs ordered are listed, but only abnormal results are displayed) Labs Reviewed - No data to display  EKG None  Radiology No results found.  Procedures .Marland KitchenIncision and Drainage  Date/Time: 11/10/2021 3:42 PM Performed by: Tedd Sias, PA Authorized by: Tedd Sias, PA   Consent:    Consent obtained:  Verbal   Consent given by:  Patient   Risks discussed:  Bleeding, incomplete drainage, pain and damage to other organs   Alternatives discussed:  No treatment Universal protocol:    Procedure explained and questions answered to patient or proxy's satisfaction: yes     Relevant documents present and verified: yes     Test results available : yes     Imaging studies available: yes     Required blood products, implants, devices, and special equipment available: yes     Site/side marked: yes     Immediately prior to procedure, a time out was called: yes     Patient identity confirmed:  Verbally with patient Location:    Type:  Abscess   Location:  Lower extremity   Lower extremity location:  Toe   Toe location:  L big toe Pre-procedure details:    Skin preparation:  Betadine Anesthesia:    Anesthesia method:  Nerve block   Block location:  Digital block of left big toe   Block needle gauge:  27 G   Block anesthetic:  Lidocaine 1% w/o epi   Block technique:  Digital   Block injection procedure:  Anatomic landmarks identified and introduced needle Procedure type:    Complexity:  Simple Procedure details:    Incision types:  Single  straight   Incision depth:  Subcutaneous   Wound management:  Probed and deloculated, irrigated with saline and extensive cleaning   Drainage:  Purulent   Drainage amount:  Moderate Post-procedure details:    Procedure completion:  Tolerated well, no immediate complications    Medications Ordered in ED Medications  lidocaine (PF) (XYLOCAINE) 1 % injection 10 mL (has no administration in time  range)    ED Course/ Medical Decision Making/ A&P                           Medical Decision Making  Patient with paronychia of her left great toe.  This was incised and drained.  Patient had improvement in her symptoms after nerve block.  Purulent drainage moderate amount for the size of wound was expressed.   Vitals WNLs  I&D successful w purulent DC.   Pt will follow up w PCP   We had a long discussion about the importance of PCP FU.   Doxy at DC  Final Clinical Impression(s) / ED Diagnoses Final diagnoses:  Paronychia of great toe of left foot    Rx / DC Orders ED Discharge Orders          Ordered    doxycycline (VIBRAMYCIN) 100 MG capsule  2 times daily        11/10/21 1341              Pati Gallo [place], [place] 11/11/21 1627    Tegeler, Gwenyth Allegra, MD 11/12/21 [phone]

## 2021-11-10 NOTE — ED Triage Notes (Signed)
Patient here with left great toe pain.  She states that she had a pedicure a few months ago, they cut the great toe down and now she is having swelling and redness around the toe nail bed.

## 2021-11-10 NOTE — Discharge Instructions (Signed)
Taken biotic as prescribed warm compresses 4 times daily follow-up with your primary care doctor.  It is vitally important for you to follow-up with a primary care doctor to be checked for diabetes and have your blood pressure checked and practice preventative medicine.

## 2021-12-04 ENCOUNTER — Other Ambulatory Visit: Payer: Self-pay

## 2021-12-04 ENCOUNTER — Encounter (HOSPITAL_COMMUNITY): Payer: Self-pay | Admitting: *Deleted

## 2021-12-04 ENCOUNTER — Ambulatory Visit (HOSPITAL_COMMUNITY)
Admission: EM | Admit: 2021-12-04 | Discharge: 2021-12-04 | Disposition: A | Discharge status: Home / Self Care | Payer: BC Managed Care – PPO | Attending: Internal Medicine | Admitting: Internal Medicine

## 2021-12-04 ENCOUNTER — Telehealth (HOSPITAL_COMMUNITY): Payer: Self-pay | Admitting: *Deleted

## 2021-12-04 DIAGNOSIS — J4521 Mild intermittent asthma with (acute) exacerbation: Secondary | ICD-10-CM | POA: Diagnosis not present

## 2021-12-04 DIAGNOSIS — J069 Acute upper respiratory infection, unspecified: Secondary | ICD-10-CM

## 2021-12-04 MED ORDER — GUAIFENESIN ER 600 MG PO TB12: 600.0000 mg | ORAL_TABLET | Freq: Two times a day (BID) | ORAL | 0 refills | Status: DC

## 2021-12-04 MED ORDER — BENZONATATE 100 MG PO CAPS: 100.0000 mg | ORAL_CAPSULE | Freq: Three times a day (TID) | ORAL | 0 refills | Status: DC | PRN

## 2021-12-04 MED ORDER — PREDNISONE 20 MG PO TABS: 20.0000 mg | ORAL_TABLET | Freq: Every day | ORAL | 0 refills | Status: AC

## 2021-12-04 MED ORDER — ALBUTEROL SULFATE HFA 108 (90 BASE) MCG/ACT IN AERS: 1.0000 | INHALATION_SPRAY | Freq: Four times a day (QID) | RESPIRATORY_TRACT | 1 refills | Status: DC | PRN

## 2021-12-04 NOTE — ED Provider Notes (Signed)
[facility]    CSN: [id number] Arrival date & time: 12/04/21  1218      History   Chief Complaint Chief Complaint  Patient presents with   Asthma   Cough    HPI Sara Booth is a 46 y.o. female with a history of asthma comes to urgent care with a few days history of runny nose, sore throat and a cough.  Patient's symptoms started about 3 days ago and has been persistent.  She endorses chest tightness and wheezing.  She has been using her inhaler with no improvement in symptoms.  Cough is not productive of sputum.  She denies any chest pain or chest pressure.  No fever or chills.  No sick contacts.  No vomiting or diarrhea.  Patient is not vaccinated against COVID-19 virus.  She has run out of her inhaler. HPI  Past Medical History:  Diagnosis Date   Anxiety    Asthma     Patient Active Problem List   Diagnosis Date Noted   Rosacea 40/81/4481   Abscess, umbilical 85/63/1497   Post op infection 07/15/2015   Subcutaneous abscess 07/14/2015   Dermoid cyst of left ovary    Pelvic adhesions    Amenorrhea 03/23/2013   Bipolar disorder ([place]) 03/03/2012   Sinus infection 12/20/2011   Callus of foot 01/12/2011   PAP SMER CERV W/HI GRADE SQUAMOUS INTRAEPITH LES 10/19/2010   IBS 07/09/2008   IRREGULAR MENSTRUAL CYCLE 09/19/2007   ANXIETY STATE NOS 01/10/2007   OBESITY, NOS 01/02/2007   TOBACCO DEPENDENCE 01/02/2007   RHINITIS, ALLERGIC 01/02/2007   ASTHMA, PERSISTENT 01/02/2007   CERVICAL DYSPLASIA 01/02/2007    Past Surgical History:  Procedure Laterality Date   CHOLECYSTECTOMY     INCISION AND DRAINAGE ABSCESS N/A 07/14/2015   Procedure: INCISION AND DRAINAGE UMBILICAL ABSCESS;  Surgeon: Guss Bunde, MD;  Location: [facility];  Service: Gynecology;  Laterality: N/A;   LAPAROSCOPIC UNILATERAL SALPINGO OOPHERECTOMY Left 06/28/2015   Procedure: LAPAROSCOPIC LEFT SALPINGO OOPHORECTOMY;  Surgeon: Mora Bellman, MD;  Location: [facility];  Service: Gynecology;  Laterality:  Left;   TUBAL LIGATION      OB History     Gravida  5   Para  4   Term  4   Preterm      AB  1   Living  4      SAB  1   IAB      Ectopic      Multiple      Live Births               Home Medications    Prior to Admission medications   Medication Sig Start Date End Date Taking? Authorizing Provider  guaiFENesin (MUCINEX) 600 MG 12 hr tablet Take 1 tablet (600 mg total) by mouth 2 (two) times daily. 12/04/21  Yes Minnie Legros, Myrene Galas, MD  predniSONE (DELTASONE) 20 MG tablet Take 1 tablet (20 mg total) by mouth daily for 5 days. 12/04/21 12/09/21 Yes Ceclia Koker, Myrene Galas, MD  acetaminophen (TYLENOL) 500 MG tablet Take 500 mg by mouth every 6 (six) hours as needed.    [provider]  albuterol (VENTOLIN HFA) 108 (90 Base) MCG/ACT inhaler Inhale 1 puff into the lungs every 6 (six) hours as needed for wheezing or shortness of breath. 12/04/21   Azelie Noguera, Myrene Galas, MD  benzonatate (TESSALON) 100 MG capsule Take 1 capsule (100 mg total) by mouth 3 (three) times daily as needed. 12/04/21  Chase Picket, MD  caffeine 200 MG TABS tablet Take 200 mg by mouth every 4 (four) hours as needed.    [provider]  cetirizine (ZYRTEC) 10 MG tablet Take 10 mg by mouth daily.    [provider]  fluconazole (DIFLUCAN) 150 MG tablet Take 1 tablet (150 mg total) by mouth every 3 (three) days as needed for up to 3 doses. 04/23/21   Raspet, Junie Panning K, PA-C  fluticasone (FLONASE) 50 MCG/ACT nasal spray Place 1 spray into both nostrils daily.    [provider]  ibuprofen (ADVIL,MOTRIN) 600 MG tablet Take 1 tablet (600 mg total) by mouth every 6 (six) hours as needed for moderate pain. 07/18/15   Truett Mainland, DO  meclizine (ANTIVERT) 25 MG tablet Take 1 tablet (25 mg total) by mouth 3 (three) times daily as needed for dizziness. 08/02/21   Vanessa Kick, MD  metroNIDAZOLE (FLAGYL) 500 MG tablet Take 1 tablet (500 mg total) by mouth 2 (two) times daily. 04/25/21    Jaynee Eagles, PA-C  ondansetron (ZOFRAN) 8 MG tablet Take 1 tablet (8 mg total) by mouth every 8 (eight) hours as needed for nausea or vomiting. 12/31/20   Hazel Sams, PA-C  ondansetron (ZOFRAN-ODT) 4 MG disintegrating tablet Take 1 tablet (4 mg total) by mouth every 8 (eight) hours as needed for nausea or vomiting. 06/08/20   Zigmund Gottron, NP  permethrin (ELIMITE) 5 % cream Apply to affected area once, apply head toe and then leave on for 8 hours and wash off. Repeat in 2 weeks if needed. 60 07/08/19   Bast, Traci A, NP  Prenatal Vit-Fe Fumarate-FA (PRENATAL MULTIVITAMIN) TABS tablet Take 1 tablet by mouth daily at 12 noon.    [provider]  triamcinolone cream (KENALOG) 0.1 % Apply 1 application topically 2 (two) times daily. 07/08/19   Orvan July, NP    Family History Family History  Problem Relation Age of Onset   Depression Mother    Heart disease Father    Cancer Maternal Grandmother        breast    Social History Social History   Tobacco Use   Smoking status: Every Day    Packs/day: 1.00    Types: Cigarettes   Smokeless tobacco: Never  Vaping Use   Vaping Use: Former  Substance Use Topics   Alcohol use: No    Alcohol/week: 0.0 standard drinks   Drug use: No    Comment: uses E-cigs without the nicotine     Allergies   Amoxicillin, Erythromycin, Penicillins, and Latex   Review of Systems Review of Systems  HENT: Negative.    Respiratory:  Positive for cough, chest tightness, shortness of breath and wheezing.   Cardiovascular:  Negative for chest pain and palpitations.  Gastrointestinal: Negative.   Neurological: Negative.     Physical Exam Triage Vital Signs ED Triage Vitals  Enc Vitals Group     BP 12/04/21 1329 135/78     Pulse Rate 12/04/21 1329 69     Resp 12/04/21 1329 20     Temp 12/04/21 1329 98.3 F (36.8 C)     Temp src --      SpO2 --      Weight --      Height --      Head Circumference --      Peak Flow --      Pain Score  12/04/21 1327 0     Pain Loc --  Pain Edu? --      Excl. in [place]? --    No data found.  Updated Vital Signs BP 135/78    Pulse 69    Temp 98.3 F (36.8 C)    Resp 20    LMP 11/03/2021   Visual Acuity Right Eye Distance:   Left Eye Distance:   Bilateral Distance:    Right Eye Near:   Left Eye Near:    Bilateral Near:     Physical Exam Vitals and nursing note reviewed.  Constitutional:      General: She is not in acute distress.    Appearance: She is not ill-appearing.  Cardiovascular:     Rate and Rhythm: Normal rate and regular rhythm.     Pulses: Normal pulses.     Heart sounds: Normal heart sounds.  Pulmonary:     Effort: Pulmonary effort is normal.     Breath sounds: Normal breath sounds. No wheezing or rhonchi.  Abdominal:     General: Bowel sounds are normal.     Palpations: Abdomen is soft.  Neurological:     Mental Status: She is alert.     UC Treatments / Results  Labs (all labs ordered are listed, but only abnormal results are displayed) Labs Reviewed  SARS CORONAVIRUS 2 (TAT 6-24 HRS)    EKG   Radiology No results found.  Procedures Procedures (including critical care time)  Medications Ordered in UC Medications - No data to display  Initial Impression / Assessment and Plan / UC Course  I have reviewed the triage vital signs and the nursing notes.  Pertinent labs & imaging results that were available during my care of the patient were reviewed by me and considered in my medical decision making (see chart for details).     1.  Mild intermittent asthma with acute exacerbation: Refill albuterol inhaler Prednisone 20 mg orally daily for 5 days Mucinex 600 mg every 12 hours Maintain adequate hydration Tessalon Perles as needed for cough Return precautions given No indication for chest x-ray at this time. Final Clinical Impressions(s) / UC Diagnoses   Final diagnoses:  Viral URI with cough  Mild intermittent asthma with (acute)  exacerbation     Discharge Instructions      Please use medications as prescribed We will call you with recommendations if labs are abnormal Maintain adequate hydration Tylenol/Motrin as needed for pain   ED Prescriptions     Medication Sig Dispense Auth. Provider   albuterol (VENTOLIN HFA) 108 (90 Base) MCG/ACT inhaler Inhale 1 puff into the lungs every 6 (six) hours as needed for wheezing or shortness of breath. 18 g Chase Picket, MD   guaiFENesin (MUCINEX) 600 MG 12 hr tablet Take 1 tablet (600 mg total) by mouth 2 (two) times daily. 20 tablet Barnes Florek, Myrene Galas, MD   predniSONE (DELTASONE) 20 MG tablet Take 1 tablet (20 mg total) by mouth daily for 5 days. 5 tablet Miki Labuda, Myrene Galas, MD   benzonatate (TESSALON) 100 MG capsule Take 1 capsule (100 mg total) by mouth 3 (three) times daily as needed. 30 capsule Maxie Debose, Myrene Galas, MD      PDMP not reviewed this encounter.   Chase Picket, MD 12/04/21 [phone]

## 2021-12-04 NOTE — Discharge Instructions (Addendum)
Please use medications as prescribed We will call you with recommendations if labs are abnormal Maintain adequate hydration Tylenol/Motrin as needed for pain

## 2021-12-04 NOTE — ED Triage Notes (Signed)
Pt reports increased problems with asthma and has used all her HHN . Pt reports she needs a refill. Pt also reports a HA and back ache.

## 2022-01-31 ENCOUNTER — Ambulatory Visit (HOSPITAL_COMMUNITY)
Admission: EM | Admit: 2022-01-31 | Discharge: 2022-01-31 | Disposition: A | Discharge status: Home / Self Care | Payer: BC Managed Care – PPO | Attending: Physician Assistant | Admitting: Physician Assistant

## 2022-01-31 ENCOUNTER — Other Ambulatory Visit: Payer: Self-pay

## 2022-01-31 ENCOUNTER — Encounter (HOSPITAL_COMMUNITY): Payer: Self-pay | Admitting: Emergency Medicine

## 2022-01-31 DIAGNOSIS — N920 Excessive and frequent menstruation with regular cycle: Secondary | ICD-10-CM | POA: Insufficient documentation

## 2022-01-31 DIAGNOSIS — R5383 Other fatigue: Secondary | ICD-10-CM | POA: Insufficient documentation

## 2022-01-31 DIAGNOSIS — R52 Pain, unspecified: Secondary | ICD-10-CM | POA: Diagnosis not present

## 2022-01-31 DIAGNOSIS — R5381 Other malaise: Secondary | ICD-10-CM | POA: Insufficient documentation

## 2022-01-31 DIAGNOSIS — R051 Acute cough: Secondary | ICD-10-CM | POA: Diagnosis not present

## 2022-01-31 LAB — POCT URINALYSIS DIPSTICK, ED / UC
Bilirubin Urine: NEGATIVE
Glucose, UA: NEGATIVE mg/dL
Ketones, ur: NEGATIVE mg/dL
Leukocytes,Ua: NEGATIVE
Nitrite: NEGATIVE
Protein, ur: NEGATIVE mg/dL
Specific Gravity, Urine: 1.015 (ref 1.005–1.030)
Urobilinogen, UA: 0.2 mg/dL (ref 0.0–1.0)
pH: 5.5 (ref 5.0–8.0)

## 2022-01-31 LAB — POC INFLUENZA A AND B ANTIGEN (URGENT CARE ONLY)
INFLUENZA A ANTIGEN, POC: NEGATIVE
INFLUENZA B ANTIGEN, POC: NEGATIVE

## 2022-01-31 MED ORDER — ALBUTEROL SULFATE HFA 108 (90 BASE) MCG/ACT IN AERS: 1.0000 | INHALATION_SPRAY | Freq: Four times a day (QID) | RESPIRATORY_TRACT | 0 refills | Status: DC | PRN

## 2022-01-31 NOTE — Discharge Instructions (Signed)
I will contact you with your blood counts if they are abnormal.  I do think you should follow-up with an OB/GYN.  Make sure you are resting and drinking plenty of fluid.  Eat small frequent meals.  If you have any worsening symptoms you should return for reevaluation. ?

## 2022-01-31 NOTE — ED Triage Notes (Signed)
Pt is present today with c/o cough, chills, and body aches.  ?Pt sx started this morning ?

## 2022-01-31 NOTE — ED Provider Notes (Signed)
?[facility] ? ? ? ?CSN: [id number] ?Arrival date & time: 01/31/22  1005 ? ? ?  ? ?History   ?Chief Complaint ?Chief Complaint  ?Patient presents with  ? Cough  ? Chills  ? Nasal Congestion  ? ? ?HPI ?Sara Booth is a 46 y.o. female.  ? ?Patient presents today with a 1 day history of abdominal upset and diarrhea.  Reports several loose bowel movements.  She is unsure if there is blood present as she is currently on her menstrual cycle.  She reports associated nausea but denies any vomiting.  She has felt chills but denies any fever.  Reports associated nasal congestion and cough but is unsure if this is related to chronic allergies and smoking or underlying illness.  Denies any known sick contacts, recent antibiotic use, recent travel.  She does not have a history of diverticulitis.  She has had COVID approximately 1 year ago. ? ? ?Past Medical History:  ?Diagnosis Date  ? Anxiety   ? Asthma   ? ? ?Patient Active Problem List  ? Diagnosis Date Noted  ? Rosacea 09/06/2015  ? Abscess, umbilical 21/97/5883  ? Post op infection 07/15/2015  ? Subcutaneous abscess 07/14/2015  ? Dermoid cyst of left ovary   ? Pelvic adhesions   ? Amenorrhea 03/23/2013  ? Bipolar disorder ([place]) 03/03/2012  ? Sinus infection 12/20/2011  ? Callus of foot 01/12/2011  ? PAP SMER CERV W/HI GRADE SQUAMOUS INTRAEPITH LES 10/19/2010  ? IBS 07/09/2008  ? IRREGULAR MENSTRUAL CYCLE 09/19/2007  ? ANXIETY STATE NOS 01/10/2007  ? OBESITY, NOS 01/02/2007  ? TOBACCO DEPENDENCE 01/02/2007  ? RHINITIS, ALLERGIC 01/02/2007  ? ASTHMA, PERSISTENT 01/02/2007  ? CERVICAL DYSPLASIA 01/02/2007  ? ? ?Past Surgical History:  ?Procedure Laterality Date  ? CHOLECYSTECTOMY    ? INCISION AND DRAINAGE ABSCESS N/A 07/14/2015  ? Procedure: INCISION AND DRAINAGE UMBILICAL ABSCESS;  Surgeon: Guss Bunde, MD;  Location: [facility];  Service: Gynecology;  Laterality: N/A;  ? LAPAROSCOPIC UNILATERAL SALPINGO OOPHERECTOMY Left 06/28/2015  ? Procedure: LAPAROSCOPIC LEFT  SALPINGO OOPHORECTOMY;  Surgeon: Mora Bellman, MD;  Location: [facility];  Service: Gynecology;  Laterality: Left;  ? TUBAL LIGATION    ? ? ?OB History   ? ? Gravida  ?5  ? Para  ?4  ? Term  ?4  ? Preterm  ?   ? AB  ?1  ? Living  ?4  ?  ? ? SAB  ?1  ? IAB  ?   ? Ectopic  ?   ? Multiple  ?   ? Live Births  ?   ?   ?  ?  ? ? ? ?Home Medications   ? ?Prior to Admission medications   ?Medication Sig Start Date End Date Taking? Authorizing Provider  ?acetaminophen (TYLENOL) 500 MG tablet Take 500 mg by mouth every 6 (six) hours as needed.    [provider]  ?albuterol (VENTOLIN HFA) 108 (90 Base) MCG/ACT inhaler Inhale 1 puff into the lungs every 6 (six) hours as needed for wheezing or shortness of breath. 12/04/21   LampteyMyrene Galas, MD  ?caffeine 200 MG TABS tablet Take 200 mg by mouth every 4 (four) hours as needed.    [provider]  ?cetirizine (ZYRTEC) 10 MG tablet Take 10 mg by mouth daily.    [provider]  ?fluticasone (FLONASE) 50 MCG/ACT nasal spray Place 1 spray into both nostrils daily.    [provider]  ?ibuprofen (  ADVIL,MOTRIN) 600 MG tablet Take 1 tablet (600 mg total) by mouth every 6 (six) hours as needed for moderate pain. 07/18/15   Truett Mainland, DO  ?permethrin (ELIMITE) 5 % cream Apply to affected area once, apply head toe and then leave on for 8 hours and wash off. Repeat in 2 weeks if needed. 60 07/08/19   Orvan July, NP  ? ? ?Family History ?Family History  ?Problem Relation Age of Onset  ? Depression Mother   ? Heart disease Father   ? Cancer Maternal Grandmother   ?     breast  ? ? ?Social History ?Social History  ? ?Tobacco Use  ? Smoking status: Every Day  ?  Packs/day: 1.00  ?  Types: Cigarettes  ? Smokeless tobacco: Never  ?Vaping Use  ? Vaping Use: Former  ?Substance Use Topics  ? Alcohol use: No  ?  Alcohol/week: 0.0 standard drinks  ? Drug use: No  ?  Comment: uses E-cigs without the nicotine  ? ? ? ?Allergies   ?Amoxicillin, Erythromycin,  Penicillins, and Latex ? ? ?Review of Systems ?Review of Systems  ?Constitutional:  Positive for activity change and fever. Negative for appetite change and fatigue.  ?HENT:  Positive for congestion. Negative for sinus pressure, sneezing and sore throat.   ?Respiratory:  Positive for cough. Negative for shortness of breath.   ?Cardiovascular:  Negative for chest pain.  ?Gastrointestinal:  Positive for abdominal pain, diarrhea and nausea. Negative for vomiting.  ?Neurological:  Negative for dizziness, light-headedness and headaches.  ? ? ?Physical Exam ?Triage Vital Signs ?ED Triage Vitals  ?Enc Vitals Group  ?   BP 01/31/22 1057 132/64  ?   Pulse Rate 01/31/22 1057 62  ?   Resp 01/31/22 1057 18  ?   Temp 01/31/22 1057 97.9 ?F (36.6 ?C)  ?   Temp Source 01/31/22 1057 Oral  ?   SpO2 01/31/22 1057 100 %  ?   Weight --   ?   Height --   ?   Head Circumference --   ?   Peak Flow --   ?   Pain Score 01/31/22 1054 0  ?   Pain Loc --   ?   Pain Edu? --   ?   Excl. in [place]? --   ? ?No data found. ? ?Updated Vital Signs ?BP 132/64   Pulse 62   Temp 97.9 ?F (36.6 ?C) (Oral)   Resp 18   LMP 01/29/2022   SpO2 100%  ? ?Visual Acuity ?Right Eye Distance:   ?Left Eye Distance:   ?Bilateral Distance:   ? ?Right Eye Near:   ?Left Eye Near:    ?Bilateral Near:    ? ?Physical Exam ?Vitals reviewed.  ?Constitutional:   ?   General: She is awake. She is not in acute distress. ?   Appearance: Normal appearance. She is well-developed. She is not ill-appearing.  ?   Comments: Very pleasant female appears stated age in no acute distress  ?HENT:  ?   Head: Normocephalic and atraumatic.  ?   Right Ear: Tympanic membrane, ear canal and external ear normal. Tympanic membrane is not erythematous or bulging.  ?   Left Ear: Tympanic membrane, ear canal and external ear normal. Tympanic membrane is not erythematous or bulging.  ?   Nose:  ?   Right Sinus: No maxillary sinus tenderness or frontal sinus tenderness.  ?   Left Sinus: No maxillary  sinus tenderness  or frontal sinus tenderness.  ?   Mouth/Throat:  ?   Pharynx: Uvula midline. Posterior oropharyngeal erythema present. No oropharyngeal exudate.  ?Cardiovascular:  ?   Rate and Rhythm: Normal rate and regular rhythm.  ?   Heart sounds: Normal heart sounds, S1 normal and S2 normal. No murmur heard. ?Pulmonary:  ?   Effort: Pulmonary effort is normal.  ?   Breath sounds: Normal breath sounds. No wheezing, rhonchi or rales.  ?   Comments: Clear to auscultation bilaterally ?Abdominal:  ?   General: Bowel sounds are normal.  ?   Palpations: Abdomen is soft.  ?   Tenderness: There is abdominal tenderness in the suprapubic area and left lower quadrant. There is no right CVA tenderness, left CVA tenderness, guarding or rebound.  ?Psychiatric:     ?   Behavior: Behavior is cooperative.  ? ? ? ?UC Treatments / Results  ?Labs ?(all labs ordered are listed, but only abnormal results are displayed) ?Labs Reviewed  ?POCT URINALYSIS DIPSTICK, ED / UC - Abnormal; Notable for the following components:  ?    Result Value  ? Hgb urine dipstick TRACE (*)   ? All other components within normal limits  ?URINE CULTURE  ?CBC  ?POC INFLUENZA A AND B ANTIGEN (URGENT CARE ONLY)  ? ? ?EKG ? ? ?Radiology ?No results found. ? ?Procedures ?Procedures (including critical care time) ? ?Medications Ordered in UC ?Medications - No data to display ? ?Initial Impression / Assessment and Plan / UC Course  ?I have reviewed the triage vital signs and the nursing notes. ? ?Pertinent labs & imaging results that were available during my care of the patient were reviewed by me and considered in my medical decision making (see chart for details). ? ?  ? ?UA showed trace hemoglobin was otherwise normal which is likely related to her menstrual cycle.  Given suprapubic pain on exam we will send this off for culture but defer antibiotics until results are available.  Flu testing was obtained given URI symptoms and body aches which was negative.   Offered COVID testing but she declined this.  Patient is unsure if symptoms could be related to very heavy menstrual cycle which she is currently experiencing.  She denies history of anemia but is interested in having

## 2022-02-01 LAB — URINE CULTURE: Culture: NO GROWTH

## 2022-02-15 ENCOUNTER — Ambulatory Visit (HOSPITAL_COMMUNITY)
Admission: EM | Admit: 2022-02-15 | Discharge: 2022-02-15 | Disposition: A | Discharge status: Home / Self Care | Payer: BC Managed Care – PPO | Attending: Internal Medicine | Admitting: Internal Medicine

## 2022-02-15 ENCOUNTER — Encounter (HOSPITAL_COMMUNITY): Payer: Self-pay | Admitting: Emergency Medicine

## 2022-02-15 DIAGNOSIS — Z20822 Contact with and (suspected) exposure to covid-19: Secondary | ICD-10-CM | POA: Diagnosis not present

## 2022-02-15 DIAGNOSIS — J309 Allergic rhinitis, unspecified: Secondary | ICD-10-CM | POA: Insufficient documentation

## 2022-02-15 DIAGNOSIS — R0982 Postnasal drip: Secondary | ICD-10-CM | POA: Diagnosis present

## 2022-02-15 MED ORDER — MONTELUKAST SODIUM 10 MG PO TABS: 10.0000 mg | ORAL_TABLET | Freq: Every day | ORAL | 2 refills | Status: DC

## 2022-02-15 MED ORDER — IPRATROPIUM BROMIDE 0.03 % NA SOLN: 2.0000 | Freq: Two times a day (BID) | NASAL | 12 refills | Status: DC

## 2022-02-15 MED ORDER — PROMETHAZINE-DM 6.25-15 MG/5ML PO SYRP: 5.0000 mL | ORAL_SOLUTION | Freq: Four times a day (QID) | ORAL | 0 refills | Status: DC | PRN

## 2022-02-15 MED ORDER — GUAIFENESIN ER 600 MG PO TB12: 600.0000 mg | ORAL_TABLET | Freq: Two times a day (BID) | ORAL | 0 refills | Status: DC | PRN

## 2022-02-15 NOTE — ED Triage Notes (Signed)
Pt is present today with sinus pressure,nasal congestion, abdominal pain, diarrhea. Pt sx started one week ago ?

## 2022-02-15 NOTE — Discharge Instructions (Addendum)
Humidifier with vapor rub will help with nasal congestion and cough ?Take medications as prescribed ?Use your albuterol inhaler as needed ?Maintain adequate hydration ?We will call you with recommendations if labs are abnormal ?Return to urgent care if symptoms persist. ?

## 2022-02-16 LAB — SARS CORONAVIRUS 2 (TAT 6-24 HRS): SARS Coronavirus 2: NEGATIVE

## 2022-02-16 NOTE — ED Provider Notes (Signed)
?[facility] ? ? ? ?CSN: [id number] ?Arrival date & time: 02/15/22  1430 ? ? ?  ? ?History   ?Chief Complaint ?Chief Complaint  ?Patient presents with  ? Abdominal Pain  ? Diarrhea  ? Cough  ? Nasal Congestion  ? ? ?HPI ?Sara Booth is a 46 y.o. female comes to the urgent care with 1 week history of nasal congestion, sinus pressure, abdominal pain and diarrhea.  Patient's symptoms started a week ago.  She endorses discolored nasal discharge.  No fever or chills.  She endorses postnasal drainage with cough at night.  No shortness of breath or chest tightness.  No chest pain or sick contacts.  Patient complains of few episodes of diarrhea over the past week.  No laxative use.  No change in dietary habits.  No recent travel.  Patient endorses seasonal allergies.  She works in an environment where this significant amount of dust. ?HPI ? ?Past Medical History:  ?Diagnosis Date  ? Anxiety   ? Asthma   ? ? ?Patient Active Problem List  ? Diagnosis Date Noted  ? Rosacea 09/06/2015  ? Abscess, umbilical 56/21/3086  ? Post op infection 07/15/2015  ? Subcutaneous abscess 07/14/2015  ? Dermoid cyst of left ovary   ? Pelvic adhesions   ? Amenorrhea 03/23/2013  ? Bipolar disorder ([place]) 03/03/2012  ? Sinus infection 12/20/2011  ? Callus of foot 01/12/2011  ? PAP SMER CERV W/HI GRADE SQUAMOUS INTRAEPITH LES 10/19/2010  ? IBS 07/09/2008  ? IRREGULAR MENSTRUAL CYCLE 09/19/2007  ? ANXIETY STATE NOS 01/10/2007  ? OBESITY, NOS 01/02/2007  ? TOBACCO DEPENDENCE 01/02/2007  ? RHINITIS, ALLERGIC 01/02/2007  ? ASTHMA, PERSISTENT 01/02/2007  ? CERVICAL DYSPLASIA 01/02/2007  ? ? ?Past Surgical History:  ?Procedure Laterality Date  ? CHOLECYSTECTOMY    ? INCISION AND DRAINAGE ABSCESS N/A 07/14/2015  ? Procedure: INCISION AND DRAINAGE UMBILICAL ABSCESS;  Surgeon: Guss Bunde, MD;  Location: [facility];  Service: Gynecology;  Laterality: N/A;  ? LAPAROSCOPIC UNILATERAL SALPINGO OOPHERECTOMY Left 06/28/2015  ? Procedure: LAPAROSCOPIC LEFT  SALPINGO OOPHORECTOMY;  Surgeon: Mora Bellman, MD;  Location: [facility];  Service: Gynecology;  Laterality: Left;  ? TUBAL LIGATION    ? ? ?OB History   ? ? Gravida  ?5  ? Para  ?4  ? Term  ?4  ? Preterm  ?   ? AB  ?1  ? Living  ?4  ?  ? ? SAB  ?1  ? IAB  ?   ? Ectopic  ?   ? Multiple  ?   ? Live Births  ?   ?   ?  ?  ? ? ? ?Home Medications   ? ?Prior to Admission medications   ?Medication Sig Start Date End Date Taking? Authorizing Provider  ?guaiFENesin (MUCINEX) 600 MG 12 hr tablet Take 1 tablet (600 mg total) by mouth 2 (two) times daily as needed. 02/15/22  Yes Corinthia Helmers, Myrene Galas, MD  ?ipratropium (ATROVENT) 0.03 % nasal spray Place 2 sprays into both nostrils every 12 (twelve) hours. 02/15/22  Yes Lynel Forester, Myrene Galas, MD  ?montelukast (SINGULAIR) 10 MG tablet Take 1 tablet (10 mg total) by mouth at bedtime. 02/15/22  Yes Arnesha Schiraldi, Myrene Galas, MD  ?promethazine-dextromethorphan (PROMETHAZINE-DM) 6.25-15 MG/5ML syrup Take 5 mLs by mouth 4 (four) times daily as needed for cough. 02/15/22  Yes Etna Forquer, Myrene Galas, MD  ?acetaminophen (TYLENOL) 500 MG tablet Take 500 mg by mouth every 6 (six) hours as needed.  [provider]  ?albuterol (VENTOLIN HFA) 108 (90 Base) MCG/ACT inhaler Inhale 1 puff into the lungs every 6 (six) hours as needed for wheezing or shortness of breath. 01/31/22   Raspet, Derry Skill, PA-C  ?caffeine 200 MG TABS tablet Take 200 mg by mouth every 4 (four) hours as needed.    [provider]  ?cetirizine (ZYRTEC) 10 MG tablet Take 10 mg by mouth daily.    [provider]  ?fluticasone (FLONASE) 50 MCG/ACT nasal spray Place 1 spray into both nostrils daily.    [provider]  ?ibuprofen (ADVIL,MOTRIN) 600 MG tablet Take 1 tablet (600 mg total) by mouth every 6 (six) hours as needed for moderate pain. 07/18/15   Truett Mainland, DO  ?permethrin (ELIMITE) 5 % cream Apply to affected area once, apply head toe and then leave on for 8 hours and wash off. Repeat in 2 weeks if  needed. 60 07/08/19   Orvan July, NP  ? ? ?Family History ?Family History  ?Problem Relation Age of Onset  ? Depression Mother   ? Heart disease Father   ? Cancer Maternal Grandmother   ?     breast  ? ? ?Social History ?Social History  ? ?Tobacco Use  ? Smoking status: Every Day  ?  Packs/day: 1.00  ?  Types: Cigarettes  ? Smokeless tobacco: Never  ?Vaping Use  ? Vaping Use: Former  ?Substance Use Topics  ? Alcohol use: No  ?  Alcohol/week: 0.0 standard drinks  ? Drug use: No  ?  Comment: uses E-cigs without the nicotine  ? ? ? ?Allergies   ?Amoxicillin, Erythromycin, Penicillins, and Latex ? ? ?Review of Systems ?Review of Systems  ?Constitutional: Negative.   ?HENT:  Positive for congestion, postnasal drip, sinus pressure, sinus pain and sore throat. Negative for rhinorrhea.   ?Respiratory: Negative.    ?Cardiovascular: Negative.   ?Gastrointestinal:  Positive for diarrhea. Negative for nausea and vomiting.  ? ? ?Physical Exam ?Triage Vital Signs ?ED Triage Vitals  ?Enc Vitals Group  ?   BP 02/15/22 1612 130/80  ?   Pulse Rate 02/15/22 1612 64  ?   Resp 02/15/22 1612 17  ?   Temp 02/15/22 1612 98.2 ?F (36.8 ?C)  ?   Temp Source 02/15/22 1612 Oral  ?   SpO2 02/15/22 1612 97 %  ?   Weight --   ?   Height --   ?   Head Circumference --   ?   Peak Flow --   ?   Pain Score 02/15/22 1610 0  ?   Pain Loc --   ?   Pain Edu? --   ?   Excl. in [place]? --   ? ?No data found. ? ?Updated Vital Signs ?BP 130/80   Pulse 64   Temp 98.2 ?F (36.8 ?C) (Oral)   Resp 17   LMP 01/29/2022   SpO2 97%  ? ?Visual Acuity ?Right Eye Distance:   ?Left Eye Distance:   ?Bilateral Distance:   ? ?Right Eye Near:   ?Left Eye Near:    ?Bilateral Near:    ? ?Physical Exam ?Vitals and nursing note reviewed.  ?Constitutional:   ?   General: She is not in acute distress. ?   Appearance: She is not ill-appearing.  ?HENT:  ?   Head: Normocephalic and atraumatic.  ?   Mouth/Throat:  ?   Pharynx: No pharyngeal swelling or oropharyngeal exudate.   ?Cardiovascular:  ?  Rate and Rhythm: Normal rate and regular rhythm.  ?Pulmonary:  ?   Effort: Pulmonary effort is normal.  ?   Breath sounds: Normal breath sounds.  ?Abdominal:  ?   Palpations: Abdomen is soft. There is no hepatomegaly or splenomegaly.  ?   Tenderness: There is no abdominal tenderness.  ?   Hernia: No hernia is present.  ?Neurological:  ?   Mental Status: She is alert.  ? ? ? ?UC Treatments / Results  ?Labs ?(all labs ordered are listed, but only abnormal results are displayed) ?Labs Reviewed  ?SARS CORONAVIRUS 2 (TAT 6-24 HRS)  ? ? ?EKG ? ? ?Radiology ?No results found. ? ?Procedures ?Procedures (including critical care time) ? ?Medications Ordered in UC ?Medications - No data to display ? ?Initial Impression / Assessment and Plan / UC Course  ?I have reviewed the triage vital signs and the nursing notes. ? ?Pertinent labs & imaging results that were available during my care of the patient were reviewed by me and considered in my medical decision making (see chart for details). ? ?  ? ?1.  Allergic rhinitis with postnasal drip: ?Humidifier/vapor use will help with nasal congestion ?Singulair orally daily ?Promethazine-dextromethorphan 4 times daily as needed for coughing ?Mucinex twice daily as needed ?Maintain adequate hydration ?Return to urgent care if symptoms worsen. ?Final Clinical Impressions(s) / UC Diagnoses  ? ?Final diagnoses:  ?Allergic rhinitis with postnasal drip  ? ? ? ?Discharge Instructions   ? ?  ?Humidifier with vapor rub will help with nasal congestion and cough ?Take medications as prescribed ?Use your albuterol inhaler as needed ?Maintain adequate hydration ?We will call you with recommendations if labs are abnormal ?Return to urgent care if symptoms persist. ? ? ?ED Prescriptions   ? ? Medication Sig Dispense Auth. Provider  ? montelukast (SINGULAIR) 10 MG tablet Take 1 tablet (10 mg total) by mouth at bedtime. 30 tablet Shamieka Gullo, Myrene Galas, MD  ? ipratropium (ATROVENT)  0.03 % nasal spray Place 2 sprays into both nostrils every 12 (twelve) hours. 30 mL Ahonesty Woodfin, Myrene Galas, MD  ? promethazine-dextromethorphan (PROMETHAZINE-DM) 6.25-15 MG/5ML syrup Take 5 mLs by mouth 4 (four) times daily as n

## 2022-07-13 ENCOUNTER — Ambulatory Visit (HOSPITAL_COMMUNITY)
Admission: EM | Admit: 2022-07-13 | Discharge: 2022-07-13 | Disposition: A | Discharge status: Home / Self Care | Payer: BC Managed Care – PPO | Attending: Emergency Medicine | Admitting: Emergency Medicine

## 2022-07-13 ENCOUNTER — Encounter (HOSPITAL_COMMUNITY): Payer: Self-pay

## 2022-07-13 DIAGNOSIS — J45990 Exercise induced bronchospasm: Secondary | ICD-10-CM

## 2022-07-13 DIAGNOSIS — J4521 Mild intermittent asthma with (acute) exacerbation: Secondary | ICD-10-CM

## 2022-07-13 MED ORDER — MONTELUKAST SODIUM 10 MG PO TABS: 10.0000 mg | ORAL_TABLET | Freq: Every day | ORAL | 2 refills | Status: DC

## 2022-07-13 MED ORDER — ALBUTEROL SULFATE HFA 108 (90 BASE) MCG/ACT IN AERS: 1.0000 | INHALATION_SPRAY | Freq: Four times a day (QID) | RESPIRATORY_TRACT | 2 refills | Status: DC | PRN

## 2022-07-13 MED ORDER — IPRATROPIUM-ALBUTEROL 0.5-2.5 (3) MG/3ML IN SOLN
RESPIRATORY_TRACT | Status: AC
  Filled 2022-07-13: qty 3

## 2022-07-13 MED ORDER — IPRATROPIUM-ALBUTEROL 0.5-2.5 (3) MG/3ML IN SOLN
3.0000 mL | Freq: Once | RESPIRATORY_TRACT | Status: AC
  Administered 2022-07-13: 3 mL via RESPIRATORY_TRACT

## 2022-07-13 NOTE — ED Provider Notes (Signed)
[facility]    CSN: [id number] Arrival date & time: 07/13/22  1024     History   Chief Complaint Chief Complaint  Patient presents with   Medication Refill   Asthma    HPI Sara Booth is a 46 y.o. female.  Presents with 1 day history of chest tightness and shortness of breath.  She has history of asthma, mostly exercise-induced.  She has been going to the gym a lot recently and ran out of her inhaler yesterday.  Went to the gym without it and that is when she developed her symptoms. She is requesting refill of albuterol and Singulair.  She is establishing with a new primary care provider soon  Past Medical History:  Diagnosis Date   Anxiety    Asthma     Patient Active Problem List   Diagnosis Date Noted   Rosacea 93/81/8299   Abscess, umbilical 37/16/9678   Post op infection 07/15/2015   Subcutaneous abscess 07/14/2015   Dermoid cyst of left ovary    Pelvic adhesions    Amenorrhea 03/23/2013   Bipolar disorder ([place]) 03/03/2012   Sinus infection 12/20/2011   Callus of foot 01/12/2011   PAP SMER CERV W/HI GRADE SQUAMOUS INTRAEPITH LES 10/19/2010   IBS 07/09/2008   IRREGULAR MENSTRUAL CYCLE 09/19/2007   ANXIETY STATE NOS 01/10/2007   OBESITY, NOS 01/02/2007   TOBACCO DEPENDENCE 01/02/2007   RHINITIS, ALLERGIC 01/02/2007   ASTHMA, PERSISTENT 01/02/2007   CERVICAL DYSPLASIA 01/02/2007    Past Surgical History:  Procedure Laterality Date   CHOLECYSTECTOMY     INCISION AND DRAINAGE ABSCESS N/A 07/14/2015   Procedure: INCISION AND DRAINAGE UMBILICAL ABSCESS;  Surgeon: Guss Bunde, MD;  Location: [facility];  Service: Gynecology;  Laterality: N/A;   LAPAROSCOPIC UNILATERAL SALPINGO OOPHERECTOMY Left 06/28/2015   Procedure: LAPAROSCOPIC LEFT SALPINGO OOPHORECTOMY;  Surgeon: Mora Bellman, MD;  Location: [facility];  Service: Gynecology;  Laterality: Left;   TUBAL LIGATION      OB History     Gravida  5   Para  4   Term  4   Preterm      AB  1    Living  4      SAB  1   IAB      Ectopic      Multiple      Live Births               Home Medications    Prior to Admission medications   Medication Sig Start Date End Date Taking? Authorizing Provider  albuterol (VENTOLIN HFA) 108 (90 Base) MCG/ACT inhaler Inhale 1-2 puffs into the lungs every 6 (six) hours as needed for wheezing or shortness of breath. 07/13/22   Armoni Kludt, PA-C  caffeine 200 MG TABS tablet Take 200 mg by mouth every 4 (four) hours as needed.    [provider]  cetirizine (ZYRTEC) 10 MG tablet Take 10 mg by mouth daily.    [provider]  fluticasone (FLONASE) 50 MCG/ACT nasal spray Place 1 spray into both nostrils daily.    [provider]  ipratropium (ATROVENT) 0.03 % nasal spray Place 2 sprays into both nostrils every 12 (twelve) hours. 02/15/22   Lamptey, Myrene Galas, MD  montelukast (SINGULAIR) 10 MG tablet Take 1 tablet (10 mg total) by mouth at bedtime. 07/13/22   Tyquarius Paglia, Wells Guiles PA-C    Family History Family History  Problem Relation Age of Onset   Depression Mother  Heart disease Father    Cancer Maternal Grandmother        breast    Social History Social History   Tobacco Use   Smoking status: Every Day    Packs/day: 1.00    Types: Cigarettes   Smokeless tobacco: Never  Vaping Use   Vaping Use: Former  Substance Use Topics   Alcohol use: No    Alcohol/week: 0.0 standard drinks of alcohol   Drug use: No    Comment: uses E-cigs without the nicotine     Allergies   Amoxicillin, Erythromycin, Penicillins, and Latex   Review of Systems Review of Systems Per HPI  Physical Exam Triage Vital Signs ED Triage Vitals [07/13/22 1056]  Enc Vitals Group     BP 128/78     Pulse Rate 67     Resp 18     Temp 98.6 F (37 C)     Temp Source Oral     SpO2 97 %     Weight      Height      Head Circumference      Peak Flow      Pain Score      Pain Loc      Pain Edu?      Excl. in [place]?    No  data found.  Updated Vital Signs BP 128/78 (BP Location: Left Arm)   Pulse 67   Temp 98.6 F (37 C) (Oral)   Resp 18   SpO2 97%    Physical Exam Vitals and nursing note reviewed.  Constitutional:      General: She is not in acute distress.    Appearance: Normal appearance.     Comments: Speaking in full sentences, no acute distress  HENT:     Mouth/Throat:     Mouth: Mucous membranes are moist.     Pharynx: Uvula midline. No posterior oropharyngeal erythema.     Tonsils: No tonsillar exudate or tonsillar abscesses.  Eyes:     Conjunctiva/sclera: Conjunctivae normal.  Cardiovascular:     Rate and Rhythm: Normal rate and regular rhythm.     Pulses: Normal pulses.     Heart sounds: Normal heart sounds.  Pulmonary:     Effort: Pulmonary effort is normal. No respiratory distress.     Breath sounds: Normal breath sounds. No wheezing, rhonchi or rales.     Comments: Lungs are clear with normal effort of breathing.  Patient reports difficult to take a deep breath Musculoskeletal:     Cervical back: Normal range of motion.  Lymphadenopathy:     Cervical: No cervical adenopathy.  Neurological:     Mental Status: She is alert and oriented to person, place, and time.     UC Treatments / Results  Labs (all labs ordered are listed, but only abnormal results are displayed) Labs Reviewed - No data to display  EKG  Radiology No results found.  Procedures Procedures (including critical care time)  Medications Ordered in UC Medications  ipratropium-albuterol (DUONEB) 0.5-2.5 (3) MG/3ML nebulizer solution 3 mL (3 mLs Nebulization Given 07/13/22 1142)    Initial Impression / Assessment and Plan / UC Course  I have reviewed the triage vital signs and the nursing notes.  Pertinent labs & imaging results that were available during my care of the patient were reviewed by me and considered in my medical decision making (see chart for details).  DuoNeb given and patient reports  symptoms have fully improved.  She is taking  deeper breaths and feels like her shortness of breath is resolved. Refilled albuterol inhaler and Singulair.  Recommend patient follow-up with primary care provider regarding asthma management.  Strict return precautions and ED precautions.  Patient agrees to plan  Final Clinical Impressions(s) / UC Diagnoses   Final diagnoses:  Mild intermittent asthma with exacerbation  Exercise-induced asthma     Discharge Instructions      I have refilled your albuterol inhaler and the nightly allergy medicine.  When you are able to establish with your new primary care provider, please follow-up with them regarding your asthma management.  With any worsening symptoms including shortness of breath or trouble breathing, please go to the emergency department.    ED Prescriptions     Medication Sig Dispense Auth. Provider   montelukast (SINGULAIR) 10 MG tablet Take 1 tablet (10 mg total) by mouth at bedtime. 30 tablet Tycen Dockter, PA-C   albuterol (VENTOLIN HFA) 108 (90 Base) MCG/ACT inhaler Inhale 1-2 puffs into the lungs every 6 (six) hours as needed for wheezing or shortness of breath. 18 g Dalexa Gentz, Wells Guiles, PA-C      PDMP not reviewed this encounter.   Chamya Hunton, Wells Guiles, [place] 07/13/22 1206

## 2022-07-13 NOTE — Discharge Instructions (Addendum)
I have refilled your albuterol inhaler and the nightly allergy medicine.  When you are able to establish with your new primary care provider, please follow-up with them regarding your asthma management.  With any worsening symptoms including shortness of breath or trouble breathing, please go to the emergency department.

## 2022-07-13 NOTE — ED Triage Notes (Signed)
Onset 1-2 days of asthma flare-up and sob. Pt reports she ran out of her inhaler yesterday.  Pt is requesting a refill for Albuterol.

## 2022-08-15 ENCOUNTER — Encounter (HOSPITAL_COMMUNITY): Payer: Self-pay

## 2022-08-15 ENCOUNTER — Ambulatory Visit (HOSPITAL_COMMUNITY)
Admission: EM | Admit: 2022-08-15 | Discharge: 2022-08-15 | Disposition: A | Discharge status: Home / Self Care | Payer: BC Managed Care – PPO | Attending: Family Medicine | Admitting: Family Medicine

## 2022-08-15 DIAGNOSIS — L255 Unspecified contact dermatitis due to plants, except food: Secondary | ICD-10-CM

## 2022-08-15 MED ORDER — PREDNISONE 10 MG (48) PO TBPK: ORAL_TABLET | ORAL | 0 refills | Status: DC

## 2022-08-15 NOTE — ED Provider Notes (Signed)
[facility]   [id number] 08/15/22 Arrival Time: 9735  ASSESSMENT & PLAN:  1. Rhus dermatitis    No signs of bacterial skin infection. Begin: Meds ordered this encounter  Medications   predniSONE (STERAPRED UNI-PAK 48 TAB) 10 MG (48) TBPK tablet    Sig: Take as directed.    Dispense:  48 tablet    Refill:  0   Benadryl if needed. Will follow up with PCP or here if worsening or failing to improve as anticipated. Reviewed expectations re: course of current medical issues. Questions answered. Outlined signs and symptoms indicating need for more acute intervention. Patient verbalized understanding. After Visit Summary given.   SUBJECTIVE:  Sara Booth is a 46 y.o. female who presents with a skin complaint. Itchy rash; x week; after working outside. OTC creams and Benadryl without much relief. Afebrile.  OBJECTIVE: Vitals:   08/15/22 1539  BP: 122/66  Pulse: 82  Resp: 18  Temp: 98 F (36.7 C)  TempSrc: Oral  SpO2: 96%    General appearance: alert; no distress HEENT: [place]; AT Neck: supple with FROM Extremities: no edema; moves all extremities normally Skin: warm and dry;areas of linear papules and vesicles with surrounding erythema over extremities and torso Psychological: alert and cooperative; normal mood and affect  Allergies  Allergen Reactions   Amoxicillin Other (See Comments)    Made her very hot and her lips turned blue. She also had frequent urination.   Erythromycin     Was told not to take it in the hospital   Penicillins Other (See Comments)    She can take any cillins    Correction  Pt can  Not take  cillens per pt   Latex Rash    Past Medical History:  Diagnosis Date   Anxiety    Asthma    Social History   Socioeconomic History   Marital status: Married    Spouse name: Not on file   Number of children: Not on file   Years of education: Not on file   Highest education level: Not on file  Occupational History   Not on file   Tobacco Use   Smoking status: Every Day    Packs/day: 1.00    Types: Cigarettes   Smokeless tobacco: Never  Vaping Use   Vaping Use: Former  Substance and Sexual Activity   Alcohol use: No    Alcohol/week: 0.0 standard drinks of alcohol   Drug use: No    Comment: uses E-cigs without the nicotine   Sexual activity: Not Currently  Other Topics Concern   Not on file  Social History Narrative   Not on file   Social Determinants of Health   Financial Resource Strain: Not on file  Food Insecurity: Not on file  Transportation Needs: Not on file  Physical Activity: Not on file  Stress: Not on file  Social Connections: Not on file  Intimate Partner Violence: Not on file   Family History  Problem Relation Age of Onset   Depression Mother    Heart disease Father    Cancer Maternal Grandmother        breast   Past Surgical History:  Procedure Laterality Date   CHOLECYSTECTOMY     INCISION AND DRAINAGE ABSCESS N/A 07/14/2015   Procedure: INCISION AND DRAINAGE UMBILICAL ABSCESS;  Surgeon: Guss Bunde, MD;  Location: [facility];  Service: Gynecology;  Laterality: N/A;   LAPAROSCOPIC UNILATERAL SALPINGO OOPHERECTOMY Left 06/28/2015   Procedure: LAPAROSCOPIC LEFT SALPINGO  OOPHORECTOMY;  Surgeon: Mora Bellman, MD;  Location: [facility];  Service: Gynecology;  Laterality: Left;   TUBAL LIGATION        Vanessa Kick, MD 08/15/22 [phone]

## 2022-08-15 NOTE — ED Triage Notes (Signed)
Pt reports rash all over her body x 1 week.

## 2022-09-05 ENCOUNTER — Encounter (HOSPITAL_COMMUNITY): Payer: Self-pay | Admitting: Emergency Medicine

## 2022-09-05 ENCOUNTER — Other Ambulatory Visit: Payer: Self-pay

## 2022-09-05 ENCOUNTER — Ambulatory Visit (HOSPITAL_COMMUNITY)
Admission: EM | Admit: 2022-09-05 | Discharge: 2022-09-05 | Disposition: A | Discharge status: Home / Self Care | Payer: BC Managed Care – PPO | Attending: Urgent Care | Admitting: Urgent Care

## 2022-09-05 DIAGNOSIS — K047 Periapical abscess without sinus: Secondary | ICD-10-CM | POA: Diagnosis not present

## 2022-09-05 DIAGNOSIS — K0889 Other specified disorders of teeth and supporting structures: Secondary | ICD-10-CM | POA: Diagnosis not present

## 2022-09-05 MED ORDER — CLINDAMYCIN HCL 150 MG PO CAPS: 150.0000 mg | ORAL_CAPSULE | Freq: Three times a day (TID) | ORAL | 0 refills | Status: AC

## 2022-09-05 MED ORDER — HYDROCODONE-ACETAMINOPHEN 5-325 MG PO TABS: 1.0000 | ORAL_TABLET | Freq: Four times a day (QID) | ORAL | 0 refills | Status: DC | PRN

## 2022-09-05 NOTE — Discharge Instructions (Addendum)
You have gingivitis and a dental infection. Please keep your appointment tomorrow with the dentist. You may continue taking 600 mg of ibuprofen every 6 hours with food for mild to moderate pain. Only take hydrocodone for severe (7-10/10) pain.  Use with caution as this may make you drowsy. Start taking clindamycin 3 times daily until gone. This medication is known to cause softer stools, please purchase over-the-counter probiotic such as Culturelle to prevent diarrhea. Discuss possibly starting chlorhexidine mouthwash and/or Prevident toothpaste with your dentist tomorrow

## 2022-09-05 NOTE — ED Triage Notes (Signed)
Dental pain started 2 weeks ago.  Pain worsened over the past 2 days.  Patient has an appt tomorrow, but it will consist of telling her what is needed.  Top, right tooth, right side of face is painful

## 2022-09-05 NOTE — ED Provider Notes (Signed)
[place]    CSN: [id number] Arrival date & time: 09/05/22  1513      History   Chief Complaint Chief Complaint  Patient presents with   Dental Pain    HPI Sara Booth is a 46 y.o. female.   46 year old female presents today due to concerns of dental pain.  States she has 3 molars on the top right that need to be pulled.  This has been bothering her for the past 2 weeks, but over the past 2 days the pain has increased substantially.  Patient has been taking ibuprofen with mild pain relief.  She denies a fever, but is concerned that her lymph nodes are swollen on that side.  States she has been told she has periodontal disease in the past, brushes twice daily with Sensodyne toothpaste.  Has an appointment tomorrow at 3 PM to obtain x-rays and see the dentist.  Patient denies dysphagia, trismus, drooling.   Dental Pain   Past Medical History:  Diagnosis Date   Anxiety    Asthma     Patient Active Problem List   Diagnosis Date Noted   Rosacea 62/83/6629   Abscess, umbilical 47/65/4650   Post op infection 07/15/2015   Subcutaneous abscess 07/14/2015   Dermoid cyst of left ovary    Pelvic adhesions    Amenorrhea 03/23/2013   Bipolar disorder ([place]) 03/03/2012   Sinus infection 12/20/2011   Callus of foot 01/12/2011   PAP SMER CERV W/HI GRADE SQUAMOUS INTRAEPITH LES 10/19/2010   IBS 07/09/2008   IRREGULAR MENSTRUAL CYCLE 09/19/2007   ANXIETY STATE NOS 01/10/2007   OBESITY, NOS 01/02/2007   TOBACCO DEPENDENCE 01/02/2007   RHINITIS, ALLERGIC 01/02/2007   ASTHMA, PERSISTENT 01/02/2007   CERVICAL DYSPLASIA 01/02/2007    Past Surgical History:  Procedure Laterality Date   CHOLECYSTECTOMY     INCISION AND DRAINAGE ABSCESS N/A 07/14/2015   Procedure: INCISION AND DRAINAGE UMBILICAL ABSCESS;  Surgeon: Guss Bunde, MD;  Location: [facility];  Service: Gynecology;  Laterality: N/A;   LAPAROSCOPIC UNILATERAL SALPINGO OOPHERECTOMY Left 06/28/2015   Procedure:  LAPAROSCOPIC LEFT SALPINGO OOPHORECTOMY;  Surgeon: Mora Bellman, MD;  Location: [facility];  Service: Gynecology;  Laterality: Left;   TUBAL LIGATION      OB History     Gravida  5   Para  4   Term  4   Preterm      AB  1   Living  4      SAB  1   IAB      Ectopic      Multiple      Live Births               Home Medications    Prior to Admission medications   Medication Sig Start Date End Date Taking? Authorizing Provider  clindamycin (CLEOCIN) 150 MG capsule Take 1 capsule (150 mg total) by mouth 3 (three) times daily for 7 days. 09/05/22 09/12/22 Yes Omid Deardorff L, PA  HYDROcodone-acetaminophen (NORCO/VICODIN) 5-325 MG tablet Take 1 tablet by mouth every 6 (six) hours as needed for severe pain. 09/05/22  Yes Kirstin Kugler L, PA  albuterol (VENTOLIN HFA) 108 (90 Base) MCG/ACT inhaler Inhale 1-2 puffs into the lungs every 6 (six) hours as needed for wheezing or shortness of breath. 07/13/22   Rising, Rebecca, PA-C  caffeine 200 MG TABS tablet Take 200 mg by mouth every 4 (four) hours as needed.    [provider]  cetirizine (  ZYRTEC) 10 MG tablet Take 10 mg by mouth daily.    [provider]  fluticasone (FLONASE) 50 MCG/ACT nasal spray Place 1 spray into both nostrils daily.    [provider]  ipratropium (ATROVENT) 0.03 % nasal spray Place 2 sprays into both nostrils every 12 (twelve) hours. 02/15/22   Lamptey, Myrene Galas, MD  montelukast (SINGULAIR) 10 MG tablet Take 1 tablet (10 mg total) by mouth at bedtime. Patient not taking: Reported on 09/05/2022 07/13/22   Rising, Wells Guiles, PA-C  predniSONE (STERAPRED UNI-PAK 48 TAB) 10 MG (48) TBPK tablet Take as directed. Patient not taking: Reported on 09/05/2022 08/15/22   Vanessa Kick, MD    Family History Family History  Problem Relation Age of Onset   Depression Mother    Heart disease Father    Cancer Maternal Grandmother        breast    Social History Social History   Tobacco Use    Smoking status: Every Day    Packs/day: 1.00    Types: Cigarettes   Smokeless tobacco: Never  Vaping Use   Vaping Use: Former  Substance Use Topics   Alcohol use: No    Alcohol/week: 0.0 standard drinks of alcohol   Drug use: No    Comment: uses E-cigs without the nicotine     Allergies   Amoxicillin, Erythromycin, Penicillins, and Latex   Review of Systems Review of Systems As per HPI  Physical Exam Triage Vital Signs ED Triage Vitals  Enc Vitals Group     BP 09/05/22 1547 120/66     Pulse Rate 09/05/22 1547 69     Resp 09/05/22 1547 18     Temp 09/05/22 1547 98.5 F (36.9 C)     Temp Source 09/05/22 1547 Oral     SpO2 09/05/22 1547 98 %     Weight --      Height --      Head Circumference --      Peak Flow --      Pain Score 09/05/22 1545 8     Pain Loc --      Pain Edu? --      Excl. in [place]? --    No data found.  Updated Vital Signs BP 120/66 (BP Location: Left Arm) Comment (BP Location): large cuff  Pulse 69   Temp 98.5 F (36.9 C) (Oral)   Resp 18   LMP 07/09/2022   SpO2 98%   Visual Acuity Right Eye Distance:   Left Eye Distance:   Bilateral Distance:    Right Eye Near:   Left Eye Near:    Bilateral Near:     Physical Exam Vitals and nursing note reviewed.  Constitutional:      General: She is not in acute distress.    Appearance: Normal appearance. She is not ill-appearing or toxic-appearing.  HENT:     Head: Normocephalic and atraumatic.     Right Ear: Ear canal and external ear normal. There is no impacted cerumen.     Left Ear: Tympanic membrane, ear canal and external ear normal. There is no impacted cerumen.     Ears:     Comments: Opacified TM on R    Nose: Nose normal.     Mouth/Throat:     Mouth: Mucous membranes are moist.     Dentition: Abnormal dentition. Dental tenderness, gingival swelling and dental caries present. No dental abscesses or gum lesions.     Pharynx: Oropharynx is clear. Uvula  midline. No pharyngeal  swelling, oropharyngeal exudate, posterior oropharyngeal erythema or uvula swelling.   Eyes:     General: No scleral icterus.       Right eye: No discharge.        Left eye: No discharge.     Extraocular Movements: Extraocular movements intact.     Conjunctiva/sclera: Conjunctivae normal.     Pupils: Pupils are equal, round, and reactive to light.  Cardiovascular:     Rate and Rhythm: Normal rate.  Pulmonary:     Effort: Pulmonary effort is normal. No respiratory distress.  Musculoskeletal:     Cervical back: Normal range of motion and neck supple. No rigidity or tenderness.  Lymphadenopathy:     Cervical: No cervical adenopathy.  Skin:    General: Skin is warm and dry.     Coloration: Skin is not jaundiced.     Findings: No bruising, erythema or rash.  Neurological:     Mental Status: She is alert.      UC Treatments / Results  Labs (all labs ordered are listed, but only abnormal results are displayed) Labs Reviewed - No data to display  EKG   Radiology No results found.  Procedures Procedures (including critical care time)  Medications Ordered in UC Medications - No data to display  Initial Impression / Assessment and Plan / UC Course  I have reviewed the triage vital signs and the nursing notes.  Pertinent labs & imaging results that were available during my care of the patient were reviewed by me and considered in my medical decision making (see chart for details).     Dental pain / infection -no appreciable abscess.  No systemic signs or symptoms.  Patient has an appointment with a dentist in 24 hours.  PDMP reviewed prior to prescribing pain medication, she has had no prescriptions in the past 2 years filled.  Patient admits when she has used them in the past, she has cut them in half and will likely do this again.  6 tablets would be appropriate, will defer any additional treatment for pain to dentist.  We will also start clindamycin as patient is allergic to  penicillins.  Side effect profile discussed.   Final Clinical Impressions(s) / UC Diagnoses   Final diagnoses:  Pain, dental  Dental infection     Discharge Instructions      You have gingivitis and a dental infection. Please keep your appointment tomorrow with the dentist. You may continue taking 600 mg of ibuprofen every 6 hours with food for mild to moderate pain. Only take hydrocodone for severe (7-10/10) pain.  Use with caution as this may make you drowsy. Start taking clindamycin 3 times daily until gone. This medication is known to cause softer stools, please purchase over-the-counter probiotic such as Culturelle to prevent diarrhea. Discuss possibly starting chlorhexidine mouthwash and/or Prevident toothpaste with your dentist tomorrow     ED Prescriptions     Medication Sig Dispense Auth. Provider   clindamycin (CLEOCIN) 150 MG capsule Take 1 capsule (150 mg total) by mouth 3 (three) times daily for 7 days. 21 capsule Nikholas Geffre L, PA   HYDROcodone-acetaminophen (NORCO/VICODIN) 5-325 MG tablet Take 1 tablet by mouth every 6 (six) hours as needed for severe pain. 6 tablet Jevaughn Degollado L, [place]      I have reviewed the PDMP during this encounter.   Chaney Malling, [place] 09/05/22 1610

## 2022-10-09 ENCOUNTER — Encounter (HOSPITAL_COMMUNITY): Payer: Self-pay

## 2022-10-09 ENCOUNTER — Ambulatory Visit (INDEPENDENT_AMBULATORY_CARE_PROVIDER_SITE_OTHER): Payer: BC Managed Care – PPO

## 2022-10-09 ENCOUNTER — Ambulatory Visit (HOSPITAL_COMMUNITY)
Admission: EM | Admit: 2022-10-09 | Discharge: 2022-10-09 | Disposition: A | Discharge status: Home / Self Care | Payer: BC Managed Care – PPO | Attending: Internal Medicine | Admitting: Internal Medicine

## 2022-10-09 DIAGNOSIS — J441 Chronic obstructive pulmonary disease with (acute) exacerbation: Secondary | ICD-10-CM | POA: Insufficient documentation

## 2022-10-09 DIAGNOSIS — R062 Wheezing: Secondary | ICD-10-CM

## 2022-10-09 DIAGNOSIS — J40 Bronchitis, not specified as acute or chronic: Secondary | ICD-10-CM | POA: Insufficient documentation

## 2022-10-09 DIAGNOSIS — R059 Cough, unspecified: Secondary | ICD-10-CM

## 2022-10-09 DIAGNOSIS — F17200 Nicotine dependence, unspecified, uncomplicated: Secondary | ICD-10-CM | POA: Insufficient documentation

## 2022-10-09 DIAGNOSIS — F17218 Nicotine dependence, cigarettes, with other nicotine-induced disorders: Secondary | ICD-10-CM | POA: Diagnosis not present

## 2022-10-09 DIAGNOSIS — Z1152 Encounter for screening for COVID-19: Secondary | ICD-10-CM | POA: Insufficient documentation

## 2022-10-09 DIAGNOSIS — J45901 Unspecified asthma with (acute) exacerbation: Secondary | ICD-10-CM

## 2022-10-09 LAB — SARS CORONAVIRUS 2 (TAT 6-24 HRS): SARS Coronavirus 2: NEGATIVE

## 2022-10-09 MED ORDER — IPRATROPIUM-ALBUTEROL 0.5-2.5 (3) MG/3ML IN SOLN
3.0000 mL | Freq: Once | RESPIRATORY_TRACT | Status: AC
  Administered 2022-10-09: 3 mL via RESPIRATORY_TRACT

## 2022-10-09 MED ORDER — DOXYCYCLINE HYCLATE 100 MG PO CAPS: 100.0000 mg | ORAL_CAPSULE | Freq: Two times a day (BID) | ORAL | 0 refills | Status: DC

## 2022-10-09 MED ORDER — IPRATROPIUM-ALBUTEROL 0.5-2.5 (3) MG/3ML IN SOLN
RESPIRATORY_TRACT | Status: AC
  Filled 2022-10-09: qty 3

## 2022-10-09 MED ORDER — PREDNISONE 10 MG (21) PO TBPK: ORAL_TABLET | Freq: Every day | ORAL | 0 refills | Status: DC

## 2022-10-09 MED ORDER — BUDESONIDE-FORMOTEROL FUMARATE 160-4.5 MCG/ACT IN AERO: 2.0000 | INHALATION_SPRAY | Freq: Two times a day (BID) | RESPIRATORY_TRACT | 1 refills | Status: DC

## 2022-10-09 NOTE — ED Provider Notes (Signed)
[place]    CSN: [id number] Arrival date & time: 10/09/22  1007      History   Chief Complaint Chief Complaint  Patient presents with   Cough    HPI Sara Booth is a 46 y.o. female with onset of ST and fatigue 4 days ago, and the next day developed rhinitis and cough. Has felt hot, but has not checked her temp. Has been sweating a lot. Her cough is productive with brown sputum. Forgot to use her rescue inhaler this am which she has been using q3h lately. She is still a smoker of 1ppd. Has never seen a pulmonologist.     Past Medical History:  Diagnosis Date   Anxiety    Asthma     Patient Active Problem List   Diagnosis Date Noted   Rosacea 61/60/7371   Abscess, umbilical 04/30/9484   Post op infection 07/15/2015   Subcutaneous abscess 07/14/2015   Dermoid cyst of left ovary    Pelvic adhesions    Amenorrhea 03/23/2013   Bipolar disorder ([place]) 03/03/2012   Sinus infection 12/20/2011   Callus of foot 01/12/2011   PAP SMER CERV W/HI GRADE SQUAMOUS INTRAEPITH LES 10/19/2010   IBS 07/09/2008   IRREGULAR MENSTRUAL CYCLE 09/19/2007   ANXIETY STATE NOS 01/10/2007   OBESITY, NOS 01/02/2007   TOBACCO DEPENDENCE 01/02/2007   RHINITIS, ALLERGIC 01/02/2007   ASTHMA, PERSISTENT 01/02/2007   CERVICAL DYSPLASIA 01/02/2007    Past Surgical History:  Procedure Laterality Date   CHOLECYSTECTOMY     INCISION AND DRAINAGE ABSCESS N/A 07/14/2015   Procedure: INCISION AND DRAINAGE UMBILICAL ABSCESS;  Surgeon: Guss Bunde, MD;  Location: [facility];  Service: Gynecology;  Laterality: N/A;   LAPAROSCOPIC UNILATERAL SALPINGO OOPHERECTOMY Left 06/28/2015   Procedure: LAPAROSCOPIC LEFT SALPINGO OOPHORECTOMY;  Surgeon: Mora Bellman, MD;  Location: [facility];  Service: Gynecology;  Laterality: Left;   TUBAL LIGATION      OB History     Gravida  5   Para  4   Term  4   Preterm      AB  1   Living  4      SAB  1   IAB      Ectopic      Multiple      Live  Births               Home Medications    Prior to Admission medications   Medication Sig Start Date End Date Taking? Authorizing Provider  budesonide-formoterol (SYMBICORT) 160-4.5 MCG/ACT inhaler Inhale 2 puffs into the lungs 2 (two) times daily. 10/09/22  Yes Rodriguez-Southworth, Sunday Spillers, PA-C  doxycycline (VIBRAMYCIN) 100 MG capsule Take 1 capsule (100 mg total) by mouth 2 (two) times daily. 10/09/22  Yes Rodriguez-Southworth, Sunday Spillers, PA-C  predniSONE (STERAPRED UNI-PAK 21 TAB) 10 MG (21) TBPK tablet Take by mouth daily. Take 6 tabs by mouth daily  for 2 days, then 5 tabs for 2 days, then 4 tabs for 2 days, then 3 tabs for 2 days, 2 tabs for 2 days, then 1 tab by mouth daily for 2 days 10/09/22  Yes Rodriguez-Southworth, Sunday Spillers, PA-C  albuterol (VENTOLIN HFA) 108 (90 Base) MCG/ACT inhaler Inhale 1-2 puffs into the lungs every 6 (six) hours as needed for wheezing or shortness of breath. 07/13/22   Rising, Rebecca, PA-C  caffeine 200 MG TABS tablet Take 200 mg by mouth every 4 (four) hours as needed.    [provider]  cetirizine (  ZYRTEC) 10 MG tablet Take 10 mg by mouth daily.    [provider]  fluticasone (FLONASE) 50 MCG/ACT nasal spray Place 1 spray into both nostrils daily.    [provider]  HYDROcodone-acetaminophen (NORCO/VICODIN) 5-325 MG tablet Take 1 tablet by mouth every 6 (six) hours as needed for severe pain. 09/05/22   Crain, Whitney L, PA  ipratropium (ATROVENT) 0.03 % nasal spray Place 2 sprays into both nostrils every 12 (twelve) hours. 02/15/22   LampteyMyrene Galas, MD    Family History Family History  Problem Relation Age of Onset   Depression Mother    Heart disease Father    Cancer Maternal Grandmother        breast    Social History Social History   Tobacco Use   Smoking status: Every Day    Packs/day: 1.00    Types: Cigarettes   Smokeless tobacco: Never  Vaping Use   Vaping Use: Former  Substance Use Topics   Alcohol use: No     Alcohol/week: 0.0 standard drinks of alcohol   Drug use: No    Comment: uses E-cigs without the nicotine     Allergies   Amoxicillin, Erythromycin, Penicillins, and Latex   Review of Systems Review of Systems  Constitutional:  Positive for diaphoresis and fatigue. Negative for activity change, appetite change, chills and fever.  HENT:  Positive for rhinorrhea. Negative for sore throat.   Eyes:  Negative for discharge.  Respiratory:  Positive for cough and wheezing. Negative for chest tightness.   Cardiovascular:  Negative for chest pain.  Musculoskeletal:  Negative for myalgias.  Neurological:  Negative for headaches.     Physical Exam Triage Vital Signs ED Triage Vitals [10/09/22 1101]  Enc Vitals Group     BP 117/66     Pulse Rate 67     Resp 18     Temp 97.9 F (36.6 C)     Temp src      SpO2 97 %     Weight      Height      Head Circumference      Peak Flow      Pain Score 0     Pain Loc      Pain Edu?      Excl. in [place]?    No data found.  Updated Vital Signs BP 117/66   Pulse 67   Temp 97.9 F (36.6 C)   Resp 18   LMP 09/10/2022   SpO2 97%   Visual Acuity Right Eye Distance:   Left Eye Distance:   Bilateral Distance:    Right Eye Near:   Left Eye Near:    Bilateral Near:     Physical Exam Vitals and nursing note reviewed.  Constitutional:      General: She is not in acute distress.    Appearance: She is not toxic-appearing.  HENT:     Right Ear: Tympanic membrane, ear canal and external ear normal.     Left Ear: Tympanic membrane, ear canal and external ear normal.     Mouth/Throat:     Mouth: Mucous membranes are moist.     Pharynx: Oropharynx is clear.  Eyes:     General: No scleral icterus.    Conjunctiva/sclera: Conjunctivae normal.  Cardiovascular:     Rate and Rhythm: Normal rate and regular rhythm.     Heart sounds: No murmur heard. Pulmonary:     Comments: Has barrel chest. Has decreased breath  sounds all over and her  effort is poor Musculoskeletal:        General: Normal range of motion.     Cervical back: Neck supple.  Lymphadenopathy:     Cervical: No cervical adenopathy.  Skin:    General: Skin is warm.     Comments: clammy  Neurological:     Mental Status: She is alert and oriented to person, place, and time.     Gait: Gait normal.  Psychiatric:        Mood and Affect: Mood normal.        Behavior: Behavior normal.        Thought Content: Thought content normal.        Judgment: Judgment normal.      UC Treatments / Results  Labs (all labs ordered are listed, but only abnormal results are displayed) Labs Reviewed  SARS CORONAVIRUS 2 (TAT 6-24 HRS)    EKG   Radiology DG Chest 2 View  Result Date: 10/09/2022 CLINICAL DATA:  Smoker with cough and wheezing for 3 days. EXAM: CHEST - 2 VIEW COMPARISON:  Radiographs 11/12/2016. FINDINGS: The PA view was repeated. The heart size and mediastinal contours are normal. The lungs are hyperinflated but clear. There is no pleural effusion or pneumothorax. No acute osseous findings are evident. Mild degenerative changes in the spine. IMPRESSION: Chronic obstructive pulmonary disease. No evidence of acute cardiopulmonary process. Electronically Signed   By: Richardean Sale M.D.   On: 10/09/2022 12:35    Procedures Procedures (including critical care time)  Medications Ordered in UC Medications  ipratropium-albuterol (DUONEB) 0.5-2.5 (3) MG/3ML nebulizer solution 3 mL (3 mLs Nebulization Given 10/09/22 1145)    Initial Impression / Assessment and Plan / UC Course  I have reviewed the triage vital signs and the nursing notes. She was given a duoneb treatment which helped her breathing effort, but she still has prolonged expiratory wheezing  Pertinent labs & imaging results that were available during my care of the patient were reviewed by me and considered in my medical decision making (see chart for details).  COPD Acute bronchitis Asthma  exacerbation  I placed her on Symbicort inhaler, prednisone and Doxy as noted. See instructions.    Final Clinical Impressions(s) / UC Diagnoses   Final diagnoses:  Moderate asthma with exacerbation, unspecified whether persistent  Smoker  Bronchitis  COPD exacerbation ([place])     Discharge Instructions      Use the Symbicort inhaler every day for prevention from your asthma flairring up. Work on quit smoking, if you need help discuss it with your PCP to prescribe you something to help you quit.  Do not use the Albuterol inhaler more frequent than 4 hours since it can cause elevation of potasium.  Your chest xray shows you have COPD You need to follow up with a pulmonolgist.      ED Prescriptions     Medication Sig Dispense Auth. Provider   budesonide-formoterol (SYMBICORT) 160-4.5 MCG/ACT inhaler Inhale 2 puffs into the lungs 2 (two) times daily. 10.2 g Rodriguez-Southworth, Sunday Spillers, PA-C   predniSONE (STERAPRED UNI-PAK 21 TAB) 10 MG (21) TBPK tablet Take by mouth daily. Take 6 tabs by mouth daily  for 2 days, then 5 tabs for 2 days, then 4 tabs for 2 days, then 3 tabs for 2 days, 2 tabs for 2 days, then 1 tab by mouth daily for 2 days 42 tablet Rodriguez-Southworth, Sunday Spillers, PA-C   doxycycline (VIBRAMYCIN) 100 MG capsule Take 1 capsule (  100 mg total) by mouth 2 (two) times daily. 20 capsule Rodriguez-Southworth, Sunday Spillers, PA-C      PDMP not reviewed this encounter.   Shelby Mattocks, PA-C 10/09/22 1247

## 2022-10-09 NOTE — Discharge Instructions (Addendum)
Use the Symbicort inhaler every day for prevention from your asthma flairring up. Work on quit smoking, if you need help discuss it with your PCP to prescribe you something to help you quit.  Do not use the Albuterol inhaler more frequent than 4 hours since it can cause elevation of potasium.  Your chest xray shows you have COPD You need to follow up with a pulmonolgist.

## 2022-10-09 NOTE — ED Triage Notes (Signed)
Pt presents with complaints of cough. Reports she started feeling bad Saturday with sore throat, nasal congestion, cough, and fatigue.

## 2022-12-31 ENCOUNTER — Encounter (HOSPITAL_COMMUNITY): Payer: Self-pay

## 2022-12-31 ENCOUNTER — Ambulatory Visit (INDEPENDENT_AMBULATORY_CARE_PROVIDER_SITE_OTHER): Payer: BC Managed Care – PPO

## 2022-12-31 ENCOUNTER — Ambulatory Visit (HOSPITAL_COMMUNITY)
Admission: EM | Admit: 2022-12-31 | Discharge: 2022-12-31 | Disposition: A | Discharge status: Home / Self Care | Payer: BC Managed Care – PPO | Attending: Family Medicine | Admitting: Family Medicine

## 2022-12-31 DIAGNOSIS — J4541 Moderate persistent asthma with (acute) exacerbation: Secondary | ICD-10-CM

## 2022-12-31 DIAGNOSIS — R0602 Shortness of breath: Secondary | ICD-10-CM | POA: Diagnosis not present

## 2022-12-31 MED ORDER — IPRATROPIUM-ALBUTEROL 0.5-2.5 (3) MG/3ML IN SOLN
RESPIRATORY_TRACT | Status: AC
  Filled 2022-12-31: qty 3

## 2022-12-31 MED ORDER — BUDESONIDE-FORMOTEROL FUMARATE 160-4.5 MCG/ACT IN AERO: 2.0000 | INHALATION_SPRAY | Freq: Two times a day (BID) | RESPIRATORY_TRACT | 2 refills | Status: DC

## 2022-12-31 MED ORDER — PREDNISONE 20 MG PO TABS: 40.0000 mg | ORAL_TABLET | Freq: Every day | ORAL | 0 refills | Status: DC

## 2022-12-31 MED ORDER — IPRATROPIUM-ALBUTEROL 0.5-2.5 (3) MG/3ML IN SOLN
3.0000 mL | Freq: Once | RESPIRATORY_TRACT | Status: AC
  Administered 2022-12-31: 3 mL via RESPIRATORY_TRACT

## 2022-12-31 MED ORDER — ALBUTEROL SULFATE (2.5 MG/3ML) 0.083% IN NEBU
INHALATION_SOLUTION | RESPIRATORY_TRACT | Status: AC
  Filled 2022-12-31: qty 3

## 2022-12-31 MED ORDER — ALBUTEROL SULFATE HFA 108 (90 BASE) MCG/ACT IN AERS: 1.0000 | INHALATION_SPRAY | Freq: Four times a day (QID) | RESPIRATORY_TRACT | 2 refills | Status: DC | PRN

## 2022-12-31 NOTE — ED Triage Notes (Signed)
Pt is here for SOB, cough, lightheaded, and dizziness x 2days

## 2023-01-09 NOTE — ED Provider Notes (Signed)
[place]   [id number] 12/31/22 Arrival Time: [id number]  ASSESSMENT & PLAN:  1. Moderate persistent asthma with acute exacerbation    I have personally viewed and individually interpreted the imaging studies ordered this visit. CXR: no acute changes; no pneumothorax; no infiltrates.  Meds ordered this encounter  Medications   ipratropium-albuterol (DUONEB) 0.5-2.5 (3) MG/3ML nebulizer solution 3 mL   albuterol (VENTOLIN HFA) 108 (90 Base) MCG/ACT inhaler    Sig: Inhale 1-2 puffs into the lungs every 6 (six) hours as needed for wheezing or shortness of breath.    Dispense:  18 g    Refill:  2   budesonide-formoterol (SYMBICORT) 160-4.5 MCG/ACT inhaler    Sig: Inhale 2 puffs into the lungs 2 (two) times daily.    Dispense:  10.2 g    Refill:  2   predniSONE (DELTASONE) 20 MG tablet    Sig: Take 2 tablets (40 mg total) by mouth daily.    Dispense:  10 tablet    Refill:  0   Asthma precautions given. OTC symptom care as needed.  Recommend:  Follow-up Information     [facility].   Specialty: Urgent Care Why: If worsening or failing to improve as anticipated. Contact information: [place] [id number] [phone]                Reviewed expectations re: course of current medical issues. Questions answered. Outlined signs and symptoms indicating need for more acute intervention. Patient verbalized understanding. After Visit Summary given.  SUBJECTIVE: History from: patient.  Sara Booth is a 47 y.o. female who presents with complaint of wheezing SOB, cough, lightheaded, and dizziness x 2 days. Currently wheezing bothering her the most. Denies fever. No tx PTA. Needs asthma medications.  Social History   Tobacco Use  Smoking Status Every Day   Packs/day: 1.00   Types: Cigarettes  Smokeless Tobacco Never    OBJECTIVE:  Vitals:   12/31/22 0948  BP: 126/64  Pulse: 78  Resp: 16   Temp: 98.4 F (36.9 C)  TempSrc: Oral  SpO2: 95%    General appearance: alert; NAD but does appear uncomfortable HEENT: [place]; AT; without appreciable nasal congestion Neck: supple without LAD CV: reg Lungs: unlabored respirations,  marked  bilateral expiratory wheezing; cough: absent; no significant respiratory distress; able to speak short sentences without distress Skin: warm and dry Psychological: alert and cooperative; normal mood and affect  Imaging: DG Chest 2 View  Result Date: 12/31/2022 CLINICAL DATA:  Shortness of breath EXAM: CHEST - 2 VIEW COMPARISON:  Radiograph 10/09/2022 FINDINGS: The heart size and mediastinal contours are within normal limits.No focal airspace disease. No pleural effusion or pneumothorax.No acute osseous abnormality. IMPRESSION: No evidence of acute cardiopulmonary disease. Electronically Signed   By: Maurine Simmering M.D.   On: 12/31/2022 10:51    Allergies  Allergen Reactions   Amoxicillin Other (See Comments)    Made her very hot and her lips turned blue. She also had frequent urination.   Erythromycin     Was told not to take it in the hospital   Penicillins Other (See Comments)    She can take any cillins    Correction  Pt can  Not take  cillens per pt   Latex Rash    Past Medical History:  Diagnosis Date   Anxiety    Asthma    Family History  Problem Relation Age of Onset   Depression  Mother    Heart disease Father    Cancer Maternal Grandmother        breast   Social History   Socioeconomic History   Marital status: Married    Spouse name: Not on file   Number of children: Not on file   Years of education: Not on file   Highest education level: Not on file  Occupational History   Not on file  Tobacco Use   Smoking status: Every Day    Packs/day: 1.00    Types: Cigarettes   Smokeless tobacco: Never  Vaping Use   Vaping Use: Former  Substance and Sexual Activity   Alcohol use: No    Alcohol/week: 0.0 standard drinks of  alcohol   Drug use: No    Comment: uses E-cigs without the nicotine   Sexual activity: Not Currently  Other Topics Concern   Not on file  Social History Narrative   Not on file   Social Determinants of Health   Financial Resource Strain: Not on file  Food Insecurity: Not on file  Transportation Needs: Not on file  Physical Activity: Not on file  Stress: Not on file  Social Connections: Not on file  Intimate Partner Violence: Not on file             Vanessa Kick, MD 01/09/23 1021

## 2023-02-10 ENCOUNTER — Ambulatory Visit (HOSPITAL_COMMUNITY)
Admission: EM | Admit: 2023-02-10 | Discharge: 2023-02-10 | Disposition: A | Discharge status: Home / Self Care | Payer: BC Managed Care – PPO | Attending: Emergency Medicine | Admitting: Emergency Medicine

## 2023-02-10 ENCOUNTER — Encounter (HOSPITAL_COMMUNITY): Payer: Self-pay

## 2023-02-10 DIAGNOSIS — J019 Acute sinusitis, unspecified: Secondary | ICD-10-CM

## 2023-02-10 DIAGNOSIS — B370 Candidal stomatitis: Secondary | ICD-10-CM | POA: Diagnosis not present

## 2023-02-10 DIAGNOSIS — B9689 Other specified bacterial agents as the cause of diseases classified elsewhere: Secondary | ICD-10-CM

## 2023-02-10 DIAGNOSIS — J4520 Mild intermittent asthma, uncomplicated: Secondary | ICD-10-CM

## 2023-02-10 MED ORDER — BUDESONIDE-FORMOTEROL FUMARATE 160-4.5 MCG/ACT IN AERO: 2.0000 | INHALATION_SPRAY | Freq: Two times a day (BID) | RESPIRATORY_TRACT | 2 refills | Status: DC

## 2023-02-10 MED ORDER — DOXYCYCLINE HYCLATE 100 MG PO CAPS: 100.0000 mg | ORAL_CAPSULE | Freq: Two times a day (BID) | ORAL | 0 refills | Status: AC

## 2023-02-10 MED ORDER — NYSTATIN 100000 UNIT/ML MT SUSP: 500000.0000 [IU] | Freq: Four times a day (QID) | OROMUCOSAL | 0 refills | Status: DC

## 2023-02-10 MED ORDER — ALBUTEROL SULFATE HFA 108 (90 BASE) MCG/ACT IN AERS: INHALATION_SPRAY | RESPIRATORY_TRACT | 1 refills | Status: DC

## 2023-02-10 NOTE — Discharge Instructions (Addendum)
Please take the antibiotic as prescribed.  Take with food to avoid upset stomach.  It is very important you finish the full course; you should not have any leftover pills  Please gargle and spit with the nystatin suspension 4 times daily for the next week.  Will help with the yeast infection in your mouth  I have refilled your albuterol and Symbicort; I am not sure if the pharmacy will be able to fill these given you have had problems in the past with prior authorization.  I have asked them to fill the generic versions of each medicine. Please scan the QR code on the last page to set up with a primary care provider as soon as able

## 2023-02-10 NOTE — ED Triage Notes (Signed)
Patient here today with c/o nasal congestion, HA, dizziness, runny nose, facial pain X 1 month. She has notice blood in her right nostril. Patient has a h/o COPD. She has taken ipratropium nasal spray which helps some. No sick contacts. No fever. No recent travel.

## 2023-02-10 NOTE — ED Provider Notes (Addendum)
[facility]    CSN: [id number] Arrival date & time: 02/10/23  1216     History   Chief Complaint Chief Complaint  Patient presents with   Nasal Congestion    HPI Sara Booth is a 47 y.o. female.  1 month history of nasal congestion and sinus pressure Denies worsening of symptoms but they have been persisting despite use of nasal spray No fevers Productive cough but reports that is her baseline History of asthma.  Patient also reports history of COPD although not seen on chart review  Past Medical History:  Diagnosis Date   Anxiety    Asthma     Patient Active Problem List   Diagnosis Date Noted   Rosacea 09/06/2015   Abscess, umbilical 07/22/2015   Post op infection 07/15/2015   Subcutaneous abscess 07/14/2015   Dermoid cyst of left ovary    Pelvic adhesions    Amenorrhea 03/23/2013   Bipolar disorder 03/03/2012   Sinus infection 12/20/2011   Callus of foot 01/12/2011   PAP SMER CERV W/HI GRADE SQUAMOUS INTRAEPITH LES 10/19/2010   IBS 07/09/2008   IRREGULAR MENSTRUAL CYCLE 09/19/2007   ANXIETY STATE NOS 01/10/2007   OBESITY, NOS 01/02/2007   TOBACCO DEPENDENCE 01/02/2007   RHINITIS, ALLERGIC 01/02/2007   ASTHMA, PERSISTENT 01/02/2007   CERVICAL DYSPLASIA 01/02/2007    Past Surgical History:  Procedure Laterality Date   CHOLECYSTECTOMY     INCISION AND DRAINAGE ABSCESS N/A 07/14/2015   Procedure: INCISION AND DRAINAGE UMBILICAL ABSCESS;  Surgeon: Lesly Dukes, MD;  Location: [facility];  Service: Gynecology;  Laterality: N/A;   LAPAROSCOPIC UNILATERAL SALPINGO OOPHERECTOMY Left 06/28/2015   Procedure: LAPAROSCOPIC LEFT SALPINGO OOPHORECTOMY;  Surgeon: Catalina Antigua, MD;  Location: [facility];  Service: Gynecology;  Laterality: Left;   TUBAL LIGATION      OB History     Gravida  5   Para  4   Term  4   Preterm      AB  1   Living  4      SAB  1   IAB      Ectopic      Multiple      Live Births               Home  Medications    Prior to Admission medications   Medication Sig Start Date End Date Taking? Authorizing Provider  albuterol (VENTOLIN HFA) 108 (90 Base) MCG/ACT inhaler Use 2 puffs every 6 hours as needed 02/10/23  Yes Lilyanah Celestin, PA-C  caffeine 200 MG TABS tablet Take 200 mg by mouth every 4 (four) hours as needed.   Yes [provider]  doxycycline (VIBRAMYCIN) 100 MG capsule Take 1 capsule (100 mg total) by mouth 2 (two) times daily for 5 days. 02/10/23 02/15/23 Yes Kresha Abelson, PA-C  ipratropium (ATROVENT) 0.03 % nasal spray Place 2 sprays into both nostrils every 12 (twelve) hours. 02/15/22  Yes Lamptey, Britta Mccreedy, MD  nystatin (MYCOSTATIN) 100000 UNIT/ML suspension Take 5 mLs (500,000 Units total) by mouth 4 (four) times daily. Gargle and spit 02/10/23  Yes Wilbur Labuda, Lurena Joiner, PA-C  budesonide-formoterol (SYMBICORT) 160-4.5 MCG/ACT inhaler Inhale 2 puffs into the lungs 2 (two) times daily. 02/10/23   Nattalie Santiesteban, Lurena Joiner, PA-C    Family History Family History  Problem Relation Age of Onset   Depression Mother    Heart disease Father    Cancer Maternal Grandmother        breast  Social History Social History   Tobacco Use   Smoking status: Every Day    Packs/day: 1    Types: Cigarettes   Smokeless tobacco: Never  Vaping Use   Vaping Use: Former  Substance Use Topics   Alcohol use: No    Alcohol/week: 0.0 standard drinks of alcohol   Drug use: No    Comment: uses E-cigs without the nicotine     Allergies   Amoxicillin, Erythromycin, and Latex   Review of Systems Review of Systems As per HPI  Physical Exam Triage Vital Signs ED Triage Vitals  Enc Vitals Group     BP 02/10/23 1302 130/82     Pulse Rate 02/10/23 1302 72     Resp 02/10/23 1302 16     Temp 02/10/23 1302 97.9 F (36.6 C)     Temp Source 02/10/23 1302 Oral     SpO2 02/10/23 1302 94 %     Weight 02/10/23 1302 157 lb (71.2 kg)     Height 02/10/23 1302 5\' 7"  (1.702 m)     Head Circumference --       Peak Flow --      Pain Score 02/10/23 1301 0     Pain Loc --      Pain Edu? --      Excl. in GC? --    No data found.  Updated Vital Signs BP 130/82 (BP Location: Right Arm)   Pulse 72   Temp 97.9 F (36.6 C) (Oral)   Resp 16   Ht 5\' 7"  (1.702 m)   Wt 157 lb (71.2 kg)   LMP 02/10/2023 (Exact Date)   SpO2 94%   BMI 24.59 kg/m    Physical Exam Vitals and nursing note reviewed.  Constitutional:      General: She is not in acute distress.    Appearance: She is not ill-appearing.  HENT:     Right Ear: Tympanic membrane and ear canal normal.     Left Ear: Tympanic membrane and ear canal normal.     Nose: Congestion present. No rhinorrhea.     Right Turbinates: Swollen.     Left Turbinates: Swollen.     Mouth/Throat:     Mouth: Mucous membranes are moist.     Pharynx: Oropharynx is clear.     Tonsils: No tonsillar exudate.      Comments: A couple white spots on the left oropharynx  Eyes:     Conjunctiva/sclera: Conjunctivae normal.     Pupils: Pupils are equal, round, and reactive to light.  Cardiovascular:     Rate and Rhythm: Normal rate and regular rhythm.     Heart sounds: Normal heart sounds.  Pulmonary:     Effort: Pulmonary effort is normal. No respiratory distress.     Breath sounds: Normal breath sounds. No wheezing.  Musculoskeletal:     Cervical back: Normal range of motion.  Skin:    General: Skin is warm and dry.  Neurological:     Mental Status: She is alert and oriented to person, place, and time.     UC Treatments / Results  Labs (all labs ordered are listed, but only abnormal results are displayed) Labs Reviewed - No data to display  EKG  Radiology No results found.  Procedures Procedures (including critical care time)  Medications Ordered in UC Medications - No data to display  Initial Impression / Assessment and Plan / UC Course  I have reviewed the triage vital signs and the  nursing notes.  Pertinent labs & imaging results  that were available during my care of the patient were reviewed by me and considered in my medical decision making (see chart for details).  Afebrile and well appearing. With 1 month history of sinus congestion will cover for bacterial etiology with doxycycline BID x 5  Couple white spots in the back of the throat likely yeast, related to ICS use.  Nystatin gargle 4 times daily. Recommend to rinse mouth after inhaler use as well.  Refilled albuterol and Symbicort per patient request. Lungs clear on exam, normal work of breathing in clinic  Final Clinical Impressions(s) / UC Diagnoses   Final diagnoses:  Candida infection, oral  Mild intermittent asthma without complication  Acute bacterial sinusitis     Discharge Instructions      Please take the antibiotic as prescribed.  Take with food to avoid upset stomach.  It is very important you finish the full course; you should not have any leftover pills  Please gargle and spit with the nystatin suspension 4 times daily for the next week.  Will help with the yeast infection in your mouth  I have refilled your albuterol and Symbicort; I am not sure if the pharmacy will be able to fill these given you have had problems in the past with prior authorization.  I have asked them to fill the generic versions of each medicine. Please scan the QR code on the last page to set up with a primary care provider as soon as able    ED Prescriptions     Medication Sig Dispense Auth. Provider   albuterol (VENTOLIN HFA) 108 (90 Base) MCG/ACT inhaler Use 2 puffs every 6 hours as needed 1 each Cleaster Shiffer, PA-C   budesonide-formoterol (SYMBICORT) 160-4.5 MCG/ACT inhaler Inhale 2 puffs into the lungs 2 (two) times daily. 10.2 g Marbeth Smedley, PA-C   doxycycline (VIBRAMYCIN) 100 MG capsule Take 1 capsule (100 mg total) by mouth 2 (two) times daily for 5 days. 10 capsule Lavilla Delamora, PA-C   nystatin (MYCOSTATIN) 100000 UNIT/ML suspension Take 5 mLs  (500,000 Units total) by mouth 4 (four) times daily. Gargle and spit 60 mL Rustin Erhart, Lurena Joinerebecca, PA-C      PDMP not reviewed this encounter.   Rilyn Upshaw, Ray ChurchRebecca, PA-C 02/10/23 1422    Gavriela Cashin, Lurena JoinerRebecca, PA-C 02/10/23 1422

## 2023-03-14 ENCOUNTER — Encounter: Payer: Self-pay | Admitting: Family

## 2023-03-14 ENCOUNTER — Ambulatory Visit (INDEPENDENT_AMBULATORY_CARE_PROVIDER_SITE_OTHER): Payer: BC Managed Care – PPO | Admitting: Family

## 2023-03-14 VITALS — BP 118/77 | HR 65 | Temp 97.3°F | Ht 67.0 in | Wt 162.2 lb

## 2023-03-14 DIAGNOSIS — J3089 Other allergic rhinitis: Secondary | ICD-10-CM | POA: Diagnosis not present

## 2023-03-14 DIAGNOSIS — M79671 Pain in right foot: Secondary | ICD-10-CM | POA: Diagnosis not present

## 2023-03-14 DIAGNOSIS — F17200 Nicotine dependence, unspecified, uncomplicated: Secondary | ICD-10-CM | POA: Insufficient documentation

## 2023-03-14 DIAGNOSIS — J439 Emphysema, unspecified: Secondary | ICD-10-CM

## 2023-03-14 DIAGNOSIS — M79672 Pain in left foot: Secondary | ICD-10-CM

## 2023-03-14 DIAGNOSIS — F17210 Nicotine dependence, cigarettes, uncomplicated: Secondary | ICD-10-CM | POA: Diagnosis not present

## 2023-03-14 MED ORDER — IPRATROPIUM BROMIDE 0.03 % NA SOLN: 2.0000 | Freq: Two times a day (BID) | NASAL | 11 refills | Status: DC

## 2023-03-14 MED ORDER — BUDESONIDE-FORMOTEROL FUMARATE 160-4.5 MCG/ACT IN AERO: 2.0000 | INHALATION_SPRAY | Freq: Two times a day (BID) | RESPIRATORY_TRACT | 5 refills | Status: DC

## 2023-03-14 MED ORDER — MONTELUKAST SODIUM 10 MG PO TABS: 10.0000 mg | ORAL_TABLET | Freq: Every day | ORAL | 1 refills | Status: DC

## 2023-03-14 MED ORDER — ALBUTEROL SULFATE HFA 108 (90 BASE) MCG/ACT IN AERS: INHALATION_SPRAY | RESPIRATORY_TRACT | 5 refills | Status: DC

## 2023-03-14 MED ORDER — VARENICLINE TARTRATE 0.5 MG PO TABS: 0.5000 mg | ORAL_TABLET | ORAL | 3 refills | Status: DC

## 2023-03-14 NOTE — Progress Notes (Signed)
New Patient Office Visit  Subjective:  Patient ID: Sara Booth, female    DOB: 1975-11-09  Age: 47 y.o. MRN: [id number]  CC:  Chief Complaint  Patient presents with   Establish Care   COPD    Refill of symbicort and Albuterol inhaler. Would like to try Montelukast again at bedtime.    Foot Pain    Pt c/o bilateral foot pain. Has been taking ibuprofen 200 mg OTC 4 daily.    HPI Sara Booth presents for establishing care today.  COPD/current smoker:  pt reports smoking 1/2-1 ppd since she was 47yo. States she has been seen in UC last month for yeast and they sent in Symbicort refills for her, but she says the pharmacy can't fill because she doesn't have a PCP.  Has been on Symbicort since after Covid, after she had the virus and could not recover for months due to her lung sx. Also uses rescue inhaler. States she works out at [employer] and needs the rescue inhaler after working out. Reports she has quit smoking on her own using e-cigs w/o nicotine for a year, but then restarted.   Allergic rhinitis:  pt reports having allergies all year long, not just seasonal. Reports taking Singulair and Ipratropium nasal spray daily which controls her symptoms.  Bilateral foot pain:   pt has a hammer toe on 5th toe, blistered skin on both big toes & hammer toe, also painful calluses on plantar feet at medial and lateral sides of feet in the forefoot (ball of foot). Reports she has to wear steel toe boots for work and uses insoles and they are not tight on her feet.    Assessment & Plan:  Pulmonary emphysema, unspecified emphysema type (HCC) Assessment & Plan: chronic smoking for 32 yrs 1/2-1 ppd started needing inhalers after covid virus 2-81yrs ago - has been getting from UC, no PCP for years but now required to have one sending refills of Symbicort 160-4.5mg  & Albuterol, reminded pt of use & SE encouraged pt to quit smoking and she states she is ready f/u 6 mos or prn  Orders: -      Budesonide-Formoterol Fumarate; Inhale 2 puffs into the lungs 2 (two) times daily.  Dispense: 10.2 g; Refill: 5 -     Albuterol Sulfate HFA; Use 2 puffs every 6 hours as needed  Dispense: 1 each; Refill: 5  Allergic rhinitis due to other allergic trigger, unspecified seasonality Assessment & Plan: chronic pt reports having seasonal and non-seasonal allergies takes Singulair 10mg  qhs and Ipratropium nasal spray daily which controls her sx sending refills f/u 6 mos or prn  Orders: -     Ipratropium Bromide; Place 2 sprays into both nostrils every 12 (twelve) hours.  Dispense: 30 mL; Refill: 11 -     Montelukast Sodium; Take 1 tablet (10 mg total) by mouth at bedtime.  Dispense: 90 tablet; Refill: 1  Cigarette nicotine dependence without complication Assessment & Plan: chronic  reports quitting on her own in past using e-cigs w/o nicotine & quit for 1 year but then started back  currently smoking 1/2 ppd she purchased patches but afraid they will make her sick advised on Chantix and she would like to try this sending in generic with dose titration instructions, advised pt to let me know if any SE or concerns after starting important to stay of for at least 3 mos after quitting to ensure continued success  Orders: -     Varenicline  Tartrate; Take 1 tablet (0.5 mg total) by mouth as directed. START with 1 pill daily AFTER eating for 1 week, then increase to 1 pill bid x 1 week, then 2 pill qam, 1 pill qpm x 1week, then 2 pills qam & qpm if needed.  Dispense: 60 tablet; Refill: 3  Foot pain, bilateral - hammer toe, calluses, blisters, sending referral to Podiatry. Advised pt on good fitting shoes, wearing proper insoles for her arch and moisture wicking socks, take boots off as soon as possible, air dry, sprinkle OTC antifungal powder on feet before work and after.   -     Ambulatory referral to Podiatry  Subjective:    Outpatient Medications Prior to Visit  Medication Sig Dispense  Refill   COLLAGEN PO Take by mouth. OLLY gummies     FIBER PO Take by mouth. OLLY gummies     ibuprofen (ADVIL) 200 MG tablet Take 200 mg by mouth every 6 (six) hours as needed for moderate pain.     TURMERIC PO Take by mouth.     albuterol (VENTOLIN HFA) 108 (90 Base) MCG/ACT inhaler Use 2 puffs every 6 hours as needed 1 each 1   budesonide-formoterol (SYMBICORT) 160-4.5 MCG/ACT inhaler Inhale 2 puffs into the lungs 2 (two) times daily. 10.2 g 2   caffeine 200 MG TABS tablet Take 200 mg by mouth every 4 (four) hours as needed.     ipratropium (ATROVENT) 0.03 % nasal spray Place 2 sprays into both nostrils every 12 (twelve) hours. 30 mL 12   nystatin (MYCOSTATIN) 100000 UNIT/ML suspension Take 5 mLs (500,000 Units total) by mouth 4 (four) times daily. Gargle and spit 60 mL 0   No facility-administered medications prior to visit.   Past Medical History:  Diagnosis Date   Abscess, umbilical 07/22/2015   Amenorrhea 03/23/2013   Anxiety    Asthma    Callus of foot 01/12/2011   Patient continues to trim.  Don't think these are plantar warts.  Patient has moleskin cushion which she would to try to cushion her feet.    She doesn't want to try anything else for this at this time.  Discussed possible podiatry referral, but patient moving to [place] in 4 months and states that she would like to hold off.    Will follow up in 2-3 months with her.     Irregular menstrual cycle 09/19/2007   Qualifier: Diagnosis of   By: Burnadette Pop  MD, [place]       OBESITY, NOS 01/02/2007   Qualifier: Diagnosis of   By: Knox Royalty       Pelvic adhesions    Post op infection 07/15/2015   Subcutaneous abscess 07/14/2015   TOBACCO DEPENDENCE 01/02/2007   Qualifier: Diagnosis of   By: Knox Royalty       Past Surgical History:  Procedure Laterality Date   CHOLECYSTECTOMY     INCISION AND DRAINAGE ABSCESS N/A 07/14/2015   Procedure: INCISION AND DRAINAGE UMBILICAL ABSCESS;  Surgeon: Lesly Dukes, MD;  Location: [facility]  OR;  Service: Gynecology;  Laterality: N/A;   LAPAROSCOPIC UNILATERAL SALPINGO OOPHERECTOMY Left 06/28/2015   Procedure: LAPAROSCOPIC LEFT SALPINGO OOPHORECTOMY;  Surgeon: Catalina Antigua, MD;  Location: [facility];  Service: Gynecology;  Laterality: Left;   TUBAL LIGATION      Objective:   Today's Vitals: BP 118/77   Pulse 65   Temp (!) 97.3 F (36.3 C) (Temporal)   Ht 5\' 7"  (1.702 m)   Wt 162 lb 3.2  oz (73.6 kg)   SpO2 95%   BMI 25.40 kg/m   Physical Exam Vitals and nursing note reviewed.  Constitutional:      Appearance: Normal appearance.  Cardiovascular:     Rate and Rhythm: Normal rate and regular rhythm.  Pulmonary:     Effort: Pulmonary effort is normal.     Breath sounds: Normal breath sounds.  Musculoskeletal:        General: Normal range of motion.     Left foot: Deformity (hammer toe) present.       Feet:  Feet:     Right foot:     Skin integrity: Skin breakdown and callus present.     Left foot:     Skin integrity: Skin breakdown and callus present.  Skin:    General: Skin is warm and dry.  Neurological:     Mental Status: She is alert.  Psychiatric:        Mood and Affect: Mood normal.        Behavior: Behavior normal.     Meds ordered this encounter  Medications   budesonide-formoterol (SYMBICORT) 160-4.5 MCG/ACT inhaler    Sig: Inhale 2 puffs into the lungs 2 (two) times daily.    Dispense:  10.2 g    Refill:  5    Please fill generic or whatever is prescribed without needed prior auth   albuterol (VENTOLIN HFA) 108 (90 Base) MCG/ACT inhaler    Sig: Use 2 puffs every 6 hours as needed    Dispense:  1 each    Refill:  5    Please fill whatever generic is covered by insurance and does not need prior auth   ipratropium (ATROVENT) 0.03 % nasal spray    Sig: Place 2 sprays into both nostrils every 12 (twelve) hours.    Dispense:  30 mL    Refill:  11    Order Specific Question:   Supervising Provider    Answer:   ANDY, CAMILLE L [[id number]]    montelukast (SINGULAIR) 10 MG tablet    Sig: Take 1 tablet (10 mg total) by mouth at bedtime.    Dispense:  90 tablet    Refill:  1    Order Specific Question:   Supervising Provider    Answer:   ANDY, CAMILLE L [[id number]]   varenicline (CHANTIX) 0.5 MG tablet    Sig: Take 1 tablet (0.5 mg total) by mouth as directed. START with 1 pill daily AFTER eating for 1 week, then increase to 1 pill bid x 1 week, then 2 pill qam, 1 pill qpm x 1week, then 2 pills qam & qpm if needed.    Dispense:  60 tablet    Refill:  3    Order Specific Question:   Supervising Provider    Answer:   ANDY, CAMILLE L [[id number]]    Dulce Sellar, NP

## 2023-03-14 NOTE — Assessment & Plan Note (Signed)
chronic pt reports having seasonal and non-seasonal allergies takes Singulair 10mg  qhs and Ipratropium nasal spray daily which controls her sx sending refills f/u 6 mos or prn

## 2023-03-14 NOTE — Assessment & Plan Note (Signed)
chronic smoking for 32 yrs 1/2-1 ppd started needing inhalers after covid virus 2-75yrs ago - has been getting from UC, no PCP for years but now required to have one sending refills of Symbicort 160-4.5mg  & Albuterol, reminded pt of use & SE encouraged pt to quit smoking and she states she is ready f/u 6 mos or prn

## 2023-03-14 NOTE — Assessment & Plan Note (Signed)
chronic  reports quitting on her own in past using e-cigs w/o nicotine & quit for 1 year but then started back  currently smoking 1/2 ppd she purchased patches but afraid they will make her sick advised on Chantix and she would like to try this sending in generic with dose titration instructions, advised pt to let me know if any SE or concerns after starting important to stay of for at least 3 mos after quitting to ensure continued success

## 2023-03-14 NOTE — Patient Instructions (Addendum)
Welcome to [facility], It was a pleasure meeting you today!    As discussed, I have sent your refills to your online pharmacy. I have sent a referral to our podiatry office about your feet problems. I also have sent the generic Chantix to start taking when you are ready to completely stop smoking. Be sure to keep taking the medicine for 3 months even after you stop smoking. Let me know if you need refills. See the handout attached for other tips on quitting smoking.  Please schedule a 6 month follow up visit today for refills, or you can come in sooner as needed.    PLEASE NOTE: If you had any LAB tests please let us know if you have not heard back within a few days. You may see your results on MyChart before we have a chance to review them but we will give you a call once they are reviewed by [place]. If we ordered any REFERRALS today, please let us know if you have not heard from their office within the next week.  Let us know through MyChart if you are needing REFILLS, or have your pharmacy send [place] the request. You can also use MyChart to communicate with me or any office staff.  Please try these tips to maintain a healthy lifestyle: It is important that you exercise regularly at least 30 minutes 5 times a week. Think about what you will eat, plan ahead. Choose whole foods, & think  "clean, green, fresh or frozen" over canned, processed or packaged foods which are more sugary, salty, and fatty. 70 to 75% of food eaten should be fresh vegetables and protein. 2-3  meals daily with healthy snacks between meals, but must be whole fruit, protein or vegetables. Aim to eat over a 10 hour period when you are active, for example, 7am to 5pm, and then STOP after your last meal of the day, drinking only water.  Shorter eating windows, 6-8 hours, are showing benefits in heart disease and blood sugar regulation. Drink water every day! Shoot for 64 ounces daily = 8 cups, no  other drink is as healthy! Fruit juice is best enjoyed in a healthy way, by EATING the fruit.

## 2023-03-15 ENCOUNTER — Encounter: Payer: Self-pay | Admitting: Family

## 2023-03-27 ENCOUNTER — Ambulatory Visit (INDEPENDENT_AMBULATORY_CARE_PROVIDER_SITE_OTHER): Payer: BC Managed Care – PPO | Admitting: Podiatry

## 2023-03-27 DIAGNOSIS — Q6672 Congenital pes cavus, left foot: Secondary | ICD-10-CM | POA: Diagnosis not present

## 2023-03-27 DIAGNOSIS — L600 Ingrowing nail: Secondary | ICD-10-CM

## 2023-03-27 DIAGNOSIS — Q667 Congenital pes cavus, unspecified foot: Secondary | ICD-10-CM

## 2023-03-27 DIAGNOSIS — Q6671 Congenital pes cavus, right foot: Secondary | ICD-10-CM

## 2023-03-27 NOTE — Progress Notes (Signed)
Subjective:  Patient ID: Sara Booth, female    DOB: Aug 31, 1976,  MRN: [id number]  Chief Complaint  Patient presents with   Callouses    47 y.o. female presents with the above complaint.  Patient presents with bilateral hallux lateral border ingrown painful to touch is progressive gotten worse worse with ambulation is with pressure she has not seen and was prior to seeing me.  She would like to discuss treatment options.  She has secondary complaint of generalized foot pain at the callus spot.  Callus spots on bilateral hallux fifth digit and heel.  Patient has a pes cavus foot structure she does not wear any orthotics   Review of Systems: Negative except as noted in the HPI. Denies N/V/F/Ch.  Past Medical History:  Diagnosis Date   Abscess, umbilical 07/22/2015   Amenorrhea 03/23/2013   Anxiety    Asthma    Callus of foot 01/12/2011   Patient continues to trim.  Don't think these are plantar warts.  Patient has moleskin cushion which she would to try to cushion her feet.    She doesn't want to try anything else for this at this time.  Discussed possible podiatry referral, but patient moving to [place] in 4 months and states that she would like to hold off.    Will follow up in 2-3 months with her.     IBS 07/09/2008   Qualifier: Diagnosis of   By: Burnadette Pop  MD, Trisha Mangle       Irregular menstrual cycle 09/19/2007   Qualifier: Diagnosis of   By: Burnadette Pop  MD, Kanhka       OBESITY, NOS 01/02/2007   Qualifier: Diagnosis of   By: Knox Royalty       Pelvic adhesions    Post op infection 07/15/2015   Sinus infection 12/20/2011   Subcutaneous abscess 07/14/2015   TOBACCO DEPENDENCE 01/02/2007   Qualifier: Diagnosis of   By: Knox Royalty        Current Outpatient Medications:    albuterol (VENTOLIN HFA) 108 (90 Base) MCG/ACT inhaler, Use 2 puffs every 6 hours as needed, Disp: 1 each, Rfl: 5   budesonide-formoterol (SYMBICORT) 160-4.5 MCG/ACT inhaler, Inhale 2 puffs into the lungs 2  (two) times daily., Disp: 10.2 g, Rfl: 5   COLLAGEN PO, Take by mouth. OLLY gummies, Disp: , Rfl:    FIBER PO, Take by mouth. OLLY gummies, Disp: , Rfl:    ibuprofen (ADVIL) 200 MG tablet, Take 200 mg by mouth every 6 (six) hours as needed for moderate pain., Disp: , Rfl:    ipratropium (ATROVENT) 0.03 % nasal spray, Place 2 sprays into both nostrils every 12 (twelve) hours., Disp: 30 mL, Rfl: 11   montelukast (SINGULAIR) 10 MG tablet, Take 1 tablet (10 mg total) by mouth at bedtime., Disp: 90 tablet, Rfl: 1   TURMERIC PO, Take by mouth., Disp: , Rfl:    varenicline (CHANTIX) 0.5 MG tablet, Take 1 tablet (0.5 mg total) by mouth as directed. START with 1 pill daily AFTER eating for 1 week, then increase to 1 pill bid x 1 week, then 2 pill qam, 1 pill qpm x 1week, then 2 pills qam & qpm if needed., Disp: 60 tablet, Rfl: 3  Social History   Tobacco Use  Smoking Status Every Day   Packs/day: 1   Types: Cigarettes  Smokeless Tobacco Never    Allergies  Allergen Reactions   Amoxicillin Other (See Comments)    Made her very hot and her  lips turned blue. She also had frequent urination.   Erythromycin     Was told not to take it in the hospital   Latex Rash   Objective:  There were no vitals filed for this visit. There is no height or weight on file to calculate BMI. Constitutional Well developed. Well nourished.  Vascular Dorsalis pedis pulses palpable bilaterally. Posterior tibial pulses palpable bilaterally. Capillary refill normal to all digits.  No cyanosis or clubbing noted. Pedal hair growth normal.  Neurologic Normal speech. Oriented to person, place, and time. Epicritic sensation to light touch grossly present bilaterally.  Dermatologic Painful ingrowing nail at lateral nail borders of the hallux nail bilaterally. No other open wounds. No skin lesions.  Orthopedic: Hyperkeratotic lesion with underlying plantarflexed metatarsal of bilateral first and fifth noted with  hyperkeratotic lesion of the heel.  Pes cavus foot structure noted   Radiographs: None Assessment:   1. Ingrown toenail of right foot   2. Ingrown left big toenail   3. Pes cavus    Plan:  Patient was evaluated and treated and all questions answered.  Ingrown Nail, bilaterally -Patient elects to proceed with minor surgery to remove ingrown toenail removal today. Consent reviewed and signed by patient. -Ingrown nail excised. See procedure note. -Educated on post-procedure care including soaking. Written instructions provided and reviewed. -Patient to follow up in 2 weeks for nail check.  Procedure: Excision of Ingrown Toenail Location: Bilateral 1st toe lateral nail borders. Anesthesia: Lidocaine 1% plain; 1.5 mL and Marcaine 0.5% plain; 1.5 mL, digital block. Skin Prep: Betadine. Dressing: Silvadene; telfa; dry, sterile, compression dressing. Technique: Following skin prep, the toe was exsanguinated and a tourniquet was secured at the base of the toe. The affected nail border was freed, split with a nail splitter, and excised. Chemical matrixectomy was then performed with phenol and irrigated out with alcohol. The tourniquet was then removed and sterile dressing applied. Disposition: Patient tolerated procedure well. Patient to return in 2 weeks for follow-up.    Pes cavus -I explained to patient the etiology of pes cavus and relationship with Planter fasciitis and various treatment options were discussed.  Given patient foot structure in the setting of pes cavus I believe patient will benefit from custom-made orthotics to help control the hindfoot motion support the arch of the foot and take the stress away from plantar fascial.  Patient agrees with the plan like to proceed with orthotics -Patient was casted for orthotics

## 2023-04-09 ENCOUNTER — Encounter: Payer: Self-pay | Admitting: Podiatry

## 2023-04-23 ENCOUNTER — Telehealth: Payer: Self-pay | Admitting: Podiatry

## 2023-04-23 NOTE — Telephone Encounter (Signed)
Lmom for patient to call back to schedule picking up orthotics   Balance [phone]

## 2023-04-24 ENCOUNTER — Other Ambulatory Visit: Payer: BC Managed Care – PPO

## 2023-05-01 NOTE — Telephone Encounter (Signed)
Lmom for patient to call back to schedule picking up orthotics   Balance 469.41  

## 2023-05-29 ENCOUNTER — Encounter: Payer: Self-pay | Admitting: Family

## 2023-05-29 ENCOUNTER — Ambulatory Visit (INDEPENDENT_AMBULATORY_CARE_PROVIDER_SITE_OTHER): Payer: BC Managed Care – PPO | Admitting: Family

## 2023-05-29 VITALS — BP 100/58 | HR 67 | Temp 97.7°F | Ht 67.0 in | Wt 168.6 lb

## 2023-05-29 DIAGNOSIS — J438 Other emphysema: Secondary | ICD-10-CM | POA: Diagnosis not present

## 2023-05-29 DIAGNOSIS — Z23 Encounter for immunization: Secondary | ICD-10-CM | POA: Diagnosis not present

## 2023-05-29 DIAGNOSIS — M79601 Pain in right arm: Secondary | ICD-10-CM

## 2023-05-29 DIAGNOSIS — F17200 Nicotine dependence, unspecified, uncomplicated: Secondary | ICD-10-CM | POA: Diagnosis not present

## 2023-05-29 DIAGNOSIS — N939 Abnormal uterine and vaginal bleeding, unspecified: Secondary | ICD-10-CM

## 2023-05-29 DIAGNOSIS — R5383 Other fatigue: Secondary | ICD-10-CM | POA: Diagnosis not present

## 2023-05-29 LAB — CBC WITH DIFFERENTIAL/PLATELET
Basophils Absolute: 0.1 10*3/uL (ref 0.0–0.1)
Basophils Relative: 0.8 % (ref 0.0–3.0)
Eosinophils Absolute: 0.1 10*3/uL (ref 0.0–0.7)
Eosinophils Relative: 2.2 % (ref 0.0–5.0)
HCT: 45.1 % (ref 36.0–46.0)
Hemoglobin: 15.1 g/dL — ABNORMAL HIGH (ref 12.0–15.0)
Lymphocytes Relative: 23.1 % (ref 12.0–46.0)
Lymphs Abs: 1.5 10*3/uL (ref 0.7–4.0)
MCHC: 33.5 g/dL (ref 30.0–36.0)
MCV: 96.2 fl (ref 78.0–100.0)
Monocytes Absolute: 0.6 10*3/uL (ref 0.1–1.0)
Monocytes Relative: 8.5 % (ref 3.0–12.0)
Neutro Abs: 4.4 10*3/uL (ref 1.4–7.7)
Neutrophils Relative %: 65.4 % (ref 43.0–77.0)
Platelets: 229 10*3/uL (ref 150.0–400.0)
RBC: 4.69 Mil/uL (ref 3.87–5.11)
RDW: 12.5 % (ref 11.5–15.5)
WBC: 6.7 10*3/uL (ref 4.0–10.5)

## 2023-05-29 LAB — COMPREHENSIVE METABOLIC PANEL
ALT: 22 U/L (ref 0–35)
AST: 19 U/L (ref 0–37)
Albumin: 4.3 g/dL (ref 3.5–5.2)
Alkaline Phosphatase: 55 U/L (ref 39–117)
BUN: 23 mg/dL (ref 6–23)
CO2: 28 mEq/L (ref 19–32)
Calcium: 9.6 mg/dL (ref 8.4–10.5)
Chloride: 103 mEq/L (ref 96–112)
Creatinine, Ser: 0.73 mg/dL (ref 0.40–1.20)
GFR: 99.87 mL/min (ref >60.00)
Glucose, Bld: 79 mg/dL (ref 70–99)
Potassium: 4 mEq/L (ref 3.5–5.1)
Sodium: 140 mEq/L (ref 135–145)
Total Bilirubin: 0.5 mg/dL (ref 0.2–1.2)
Total Protein: 6.6 g/dL (ref 6.0–8.3)

## 2023-05-29 LAB — TSH: TSH: 2.18 u[IU]/mL (ref 0.35–5.50)

## 2023-05-29 LAB — VITAMIN D 25 HYDROXY (VIT D DEFICIENCY, FRACTURES): VITD: 51.38 ng/mL (ref 30.00–100.00)

## 2023-05-29 MED ORDER — BREZTRI AEROSPHERE 160-9-4.8 MCG/ACT IN AERO: 2.0000 | INHALATION_SPRAY | Freq: Two times a day (BID) | RESPIRATORY_TRACT | 1 refills | Status: DC

## 2023-05-29 NOTE — Assessment & Plan Note (Signed)
chronic smoking for 32 yrs 1/2-1 ppd, switched to vaping in the last month, having increased mucus using Symbicort 160-4.5mg  2 puffs bid, Albuterol prn given sample of Breztri to use in place of Symbicort x 2w, let me know if better, will require prior auth f/u 3 mos or prn

## 2023-05-29 NOTE — Progress Notes (Signed)
Patient ID: Sara Booth, female    DOB: 1976-07-05, 47 y.o.   MRN: [id number]  Chief Complaint  Patient presents with   Fatigue    She has been extremely tired   spotting blood    Pt stated that she has been spotting for the past 2 week    HPI: Right arm pain:  pt feels pain from her wrist which has swelling down to her elbow, denies tingling or numbness. States she does not feel pain in tendons of elbow. Wears wrist brace overnight, but can't wear at work due to job duties & has to bend her wrist. They give her a wrist brace at work to wear that just wraps around her wrist for support.  Vaginal spotting:   pt reports having regular cycles in May & June, but only spotting this month. No previous GYN surgery.   Fatigue:  states she feels very tired and can't get out of bed, happens just every other month or so. pt works in [employer], states the air conditioning broke, not in a hurry to fix, gets over heated despite drinking a gallon of water. Reports she has stopped smoking cigs and switched to vaping.   COPD/current smoker:  pt reports smoking 1/2-1 ppd since she was 47yo. Has been on Symbicort since after Covid, after she had the virus and could not recover for months due to her lung sx. Also uses rescue inhaler. States she works out at [employer] and needs the rescue inhaler after working out. Reports she has quit smoking on her own using e-cigs w/o nicotine for a year, but then restarted. *Today pt reports having more mucus build up and fatigue. States the generic Chantix was too expensive, but she has switched to Vaping and trying to gradually reduce her nicotine.  Assessment & Plan:  Other fatigue- advised on taking Vitamin D3 and Vitamin B complex daily. Needs to stop all vaping/nicotine. Hot working conditions contributing to sx, doesn't feel like exercise after work due to being so exhausted from heat. Advised on continued hydration of at least 2.5liters of water daily, wear cold neck  compresses. Checking labs to rule out other etiology.  -     VITAMIN D 25 Hydroxy (Vit-D Deficiency, Fractures) -     CBC with Differential/Platelet -     Comprehensive metabolic panel -     TSH  Other emphysema (HCC) Assessment & Plan: chronic smoking for 32 yrs 1/2-1 ppd, switched to vaping in the last month, having increased mucus using Symbicort 160-4.5mg  2 puffs bid, Albuterol prn given sample of Breztri to use in place of Symbicort x 2w, let me know if better, will require prior auth f/u 3 mos or prn  Orders: -     Breztri Aerosphere; Inhale 2 puffs into the lungs in the morning and at bedtime.  Dispense: 5.9 g; Refill: 1  Vaping nicotine dependence, non-tobacco product Assessment & Plan: chronic, smoking 1/2 ppd sent Chantix last visit, pt said cost too high >$200 decided to switch to vaping & gradually tapering nicotine down continue to encourage complete cessation f/u prn   Right arm pain- advised to continue wearing wrist brace at night. Can try to wear a compression glove while at work for support. Apply ice up to 20 min tid to right wrist. Can take 1-2 generic Aleve bid prn. Call back if pain is persisting after another 2w.  Vaginal spotting- advised she may be having peri-menopausal sx. Normal for cycles to become  irregular, longer, shorter, or stop for 1, 2 or more months. Advised to let me know if cycle becomes heavy or more painful or lasts longer than normal.  Need for Tdap vaccination -     Tdap vaccine greater than or equal to 7yo IM   Subjective:    Outpatient Medications Prior to Visit  Medication Sig Dispense Refill   albuterol (VENTOLIN HFA) 108 (90 Base) MCG/ACT inhaler Use 2 puffs every 6 hours as needed 1 each 5   budesonide-formoterol (SYMBICORT) 160-4.5 MCG/ACT inhaler Inhale 2 puffs into the lungs 2 (two) times daily. 10.2 g 5   COLLAGEN PO Take by mouth. OLLY gummies     FIBER PO Take by mouth. OLLY gummies     ibuprofen (ADVIL) 200 MG tablet  Take 200 mg by mouth every 6 (six) hours as needed for moderate pain.     ipratropium (ATROVENT) 0.03 % nasal spray Place 2 sprays into both nostrils every 12 (twelve) hours. 30 mL 11   montelukast (SINGULAIR) 10 MG tablet Take 1 tablet (10 mg total) by mouth at bedtime. 90 tablet 1   TURMERIC PO Take by mouth.     varenicline (CHANTIX) 0.5 MG tablet Take 1 tablet (0.5 mg total) by mouth as directed. START with 1 pill daily AFTER eating for 1 week, then increase to 1 pill bid x 1 week, then 2 pill qam, 1 pill qpm x 1week, then 2 pills qam & qpm if needed. 60 tablet 3   No facility-administered medications prior to visit.   Past Medical History:  Diagnosis Date   Abscess, umbilical 07/22/2015   Amenorrhea 03/23/2013   Anxiety    Asthma    Callus of foot 01/12/2011   Patient continues to trim.  Don't think these are plantar warts.  Patient has moleskin cushion which she would to try to cushion her feet.    She doesn't want to try anything else for this at this time.  Discussed possible podiatry referral, but patient moving to [place] in 4 months and states that she would like to hold off.    Will follow up in 2-3 months with her.     IBS 07/09/2008   Qualifier: Diagnosis of   By: Burnadette Pop  MD, Trisha Mangle       Irregular menstrual cycle 09/19/2007   Qualifier: Diagnosis of   By: Burnadette Pop  MD, Kanhka       OBESITY, NOS 01/02/2007   Qualifier: Diagnosis of   By: Knox Royalty       Pelvic adhesions    Post op infection 07/15/2015   Sinus infection 12/20/2011   Subcutaneous abscess 07/14/2015   TOBACCO DEPENDENCE 01/02/2007   Qualifier: Diagnosis of   By: Knox Royalty       Past Surgical History:  Procedure Laterality Date   CHOLECYSTECTOMY     INCISION AND DRAINAGE ABSCESS N/A 07/14/2015   Procedure: INCISION AND DRAINAGE UMBILICAL ABSCESS;  Surgeon: Lesly Dukes, MD;  [facility];  Service: Gynecology;  Laterality: N/A;   LAPAROSCOPIC UNILATERAL SALPINGO OOPHERECTOMY Left 06/28/2015    Procedure: LAPAROSCOPIC LEFT SALPINGO OOPHORECTOMY;  Surgeon: Catalina Antigua, MD;  Location: [facility] ORS;  Service: Gynecology;  Laterality: Left;   TUBAL LIGATION     Allergies  Allergen Reactions   Amoxicillin Other (See Comments)    Made her very hot and her lips turned blue. She also had frequent urination.   Erythromycin     Was told not to take it in the  hospital   Latex Rash      Objective:    Physical Exam Vitals and nursing note reviewed.  Constitutional:      Appearance: Normal appearance.  Cardiovascular:     Rate and Rhythm: Normal rate and regular rhythm.  Pulmonary:     Effort: Pulmonary effort is normal.     Breath sounds: Normal breath sounds.  Musculoskeletal:        General: Normal range of motion.  Skin:    General: Skin is warm and dry.  Neurological:     Mental Status: She is alert.  Psychiatric:        Mood and Affect: Mood normal.        Behavior: Behavior normal.    BP (!) 100/58   Pulse 67   Temp 97.7 F (36.5 C)   Ht 5\' 7"  (1.702 m)   Wt 168 lb 9.6 oz (76.5 kg)   SpO2 97%   BMI 26.41 kg/m  Wt Readings from Last 3 Encounters:  05/29/23 168 lb 9.6 oz (76.5 kg)  03/14/23 162 lb 3.2 oz (73.6 kg)  02/10/23 157 lb (71.2 kg)       Dulce Sellar, NP

## 2023-05-29 NOTE — Patient Instructions (Signed)
It was very nice to see you today!   I will review your lab results via MyChart in a few days.   Start the [employer] inhaler I have you today. Use this in place of Symbicort. Let me know after about a week of using if you think it works better, and I will send to your pharmacy. It probably will require a prior authorization before can be filled.        PLEASE NOTE:  If you had any lab tests please let us know if you have not heard back within a few days. You may see your results on MyChart before we have a chance to review them but we will give you a call once they are reviewed by [place]. If we ordered any referrals today, please let us know if you have not heard from their office within the next week.

## 2023-05-29 NOTE — Assessment & Plan Note (Signed)
chronic, smoking 1/2 ppd sent Chantix last visit, pt said cost too high >$200 decided to switch to vaping & gradually tapering nicotine down continue to encourage complete cessation f/u prn

## 2023-06-05 ENCOUNTER — Telehealth: Payer: Self-pay | Admitting: Family

## 2023-06-05 ENCOUNTER — Other Ambulatory Visit: Payer: Self-pay

## 2023-06-05 DIAGNOSIS — J438 Other emphysema: Secondary | ICD-10-CM

## 2023-06-05 MED ORDER — BREZTRI AEROSPHERE 160-9-4.8 MCG/ACT IN AERO: 2.0000 | INHALATION_SPRAY | Freq: Two times a day (BID) | RESPIRATORY_TRACT | 0 refills | Status: DC

## 2023-06-05 NOTE — Telephone Encounter (Signed)
RX Sent.

## 2023-06-05 NOTE — Telephone Encounter (Signed)
Patient states the Budeson-Glycopyrrol-Formoterol (BREZTRI AEROSPHERE) 160-9-4.8 MCG/ACT AERO is working better than the Budesonide.  Requests to send RX for Budeson-Glycopyrrol-Formoterol (BREZTRI AEROSPHERE) 160-9-4.8 MCG/ACT AERO  to:   Phone: [phone]  Fax: [phone]    [facility]) - [place], [place] - [address] Phone: [phone]  Fax: [phone]

## 2023-06-06 ENCOUNTER — Ambulatory Visit (HOSPITAL_COMMUNITY)
Admission: EM | Admit: 2023-06-06 | Discharge: 2023-06-06 | Disposition: A | Discharge status: Home / Self Care | Payer: BC Managed Care – PPO | Attending: Family Medicine | Admitting: Family Medicine

## 2023-06-06 ENCOUNTER — Encounter (HOSPITAL_COMMUNITY): Payer: Self-pay

## 2023-06-06 DIAGNOSIS — K0889 Other specified disorders of teeth and supporting structures: Secondary | ICD-10-CM | POA: Diagnosis not present

## 2023-06-06 MED ORDER — CLINDAMYCIN HCL 300 MG PO CAPS: 300.0000 mg | ORAL_CAPSULE | Freq: Three times a day (TID) | ORAL | 0 refills | Status: DC

## 2023-06-06 MED ORDER — HYDROCODONE-ACETAMINOPHEN 5-325 MG PO TABS: 1.0000 | ORAL_TABLET | Freq: Four times a day (QID) | ORAL | 0 refills | Status: DC | PRN

## 2023-06-06 NOTE — ED Provider Notes (Signed)
[facility]   [id number] 06/06/23 Arrival Time: 1832  ASSESSMENT & PLAN:  1. Pain, dental    No sign of abscess requiring I&D at this time.  Meds ordered this encounter  Medications   HYDROcodone-acetaminophen (NORCO/VICODIN) 5-325 MG tablet    Sig: Take 1 tablet by mouth every 6 (six) hours as needed for moderate pain or severe pain.    Dispense:  8 tablet    Refill:  0   clindamycin (CLEOCIN) 300 MG capsule    Sig: Take 1 capsule (300 mg total) by mouth 3 (three) times daily.    Dispense:  30 capsule    Refill:  0   [place] Controlled Substances Registry consulted for this patient. I feel the risk/benefit ratio today is favorable for proceeding with this prescription for a controlled substance. Medication sedation precautions given.  Dental resource written instructions given. She will schedule dental evaluation as soon as possible if not improving over the next 24-48 hours.  Reviewed expectations re: course of current medical issues. Questions answered. Outlined signs and symptoms indicating need for more acute intervention. Patient verbalized understanding. After Visit Summary given.   SUBJECTIVE:  Sara Booth is a 47 y.o. female who reports gradual onset of left upper dental pain described as ache/throb. Present for several days. Saw dentist today but they told her they could not pull the tooth. No tx. Ibuprofen not helping. Fever: absent. Tolerating PO intake but reports pain with chewing. Normal swallowing.   OBJECTIVE: Vitals:   06/06/23 2011  BP: 104/72  Pulse: 63  Resp: 16  Temp: 98.3 F (36.8 C)  TempSrc: Oral  SpO2: 95%  Weight: 76.5 kg  Height: 5\' 7"  (1.702 m)    General appearance: alert; no distress HENT: normocephalic; atraumatic; dentition: poor; left upper rear gum without areas of fluctuance, drainage, or bleeding and with tenderness to palpation; normal jaw movement without difficulty Neck: supple without LAD; FROM; trachea midline Lungs:  normal respirations; unlabored; speaks full sentences without difficulty Skin: warm and dry Psychological: alert and cooperative; normal mood and affect  Allergies  Allergen Reactions   Amoxicillin Other (See Comments)    Made her very hot and her lips turned blue. She also had frequent urination.   Erythromycin     Was told not to take it in the hospital   Latex Rash    Past Medical History:  Diagnosis Date   Abscess, umbilical 07/22/2015   Amenorrhea 03/23/2013   Anxiety    Asthma    Callus of foot 01/12/2011   Patient continues to trim.  Don't think these are plantar warts.  Patient has moleskin cushion which she would to try to cushion her feet.    She doesn't want to try anything else for this at this time.  Discussed possible podiatry referral, but patient moving to [place] in 4 months and states that she would like to hold off.    Will follow up in 2-3 months with her.     IBS 07/09/2008   Qualifier: Diagnosis of   By: Burnadette Pop  MD, Trisha Mangle       Irregular menstrual cycle 09/19/2007   Qualifier: Diagnosis of   By: Burnadette Pop  MD, Kanhka       OBESITY, NOS 01/02/2007   Qualifier: Diagnosis of   By: Knox Royalty       Pelvic adhesions    Post op infection 07/15/2015   Sinus infection 12/20/2011   Subcutaneous abscess 07/14/2015   TOBACCO DEPENDENCE 01/02/2007  Qualifier: Diagnosis of   By: Knox Royalty       Social History   Socioeconomic History   Marital status: Married    Spouse name: Not on file   Number of children: Not on file   Years of education: Not on file   Highest education level: Not on file  Occupational History   Not on file  Tobacco Use   Smoking status: Former    Current packs/day: 1.00    Types: Cigarettes   Smokeless tobacco: Never  Vaping Use   Vaping status: Every Day   Substances: Flavoring  Substance and Sexual Activity   Alcohol use: No    Alcohol/week: 0.0 standard drinks of alcohol   Drug use: No    Comment: uses E-cigs without  the nicotine   Sexual activity: Not Currently  Other Topics Concern   Not on file  Social History Narrative   Not on file   Social Determinants of Health   Financial Resource Strain: Not on file  Food Insecurity: Not on file  Transportation Needs: Not on file  Physical Activity: Not on file  Stress: Not on file  Social Connections: Not on file  Intimate Partner Violence: Not on file   Family History  Problem Relation Age of Onset   Depression Mother    Hypertension Father    Hyperlipidemia Father    Drug abuse Father    Diabetes Father    Alcohol abuse Father    Heart disease Father    Alcohol abuse Sister    Hypertension Brother    Hyperlipidemia Brother    Heart disease Brother    Learning disabilities Daughter    Depression Daughter    Alcohol abuse Daughter    Learning disabilities Son    Cancer Maternal Grandmother        breast   Arthritis Maternal Grandmother    ADD / ADHD Maternal Grandmother    ADD / ADHD Paternal Grandfather    Past Surgical History:  Procedure Laterality Date   CHOLECYSTECTOMY     INCISION AND DRAINAGE ABSCESS N/A 07/14/2015   Procedure: INCISION AND DRAINAGE UMBILICAL ABSCESS;  Surgeon: Lesly Dukes, MD;  Location: [facility] OR;  Service: Gynecology;  Laterality: N/A;   LAPAROSCOPIC UNILATERAL SALPINGO OOPHERECTOMY Left 06/28/2015   Procedure: LAPAROSCOPIC LEFT SALPINGO OOPHORECTOMY;  Surgeon: Catalina Antigua, MD;  Location: [facility];  Service: Gynecology;  Laterality: Left;   TUBAL LIGATION        Mardella Layman, MD 06/06/23 2034

## 2023-06-06 NOTE — Discharge Instructions (Signed)
Be aware, you have been prescribed pain medications that may cause drowsiness. While taking this medication, do not take any other medications containing acetaminophen (Tylenol). Do not combine with alcohol or recreational drugs. Please do not drive, operate heavy machinery, or take part in activities that require making important decisions while on this medication as your judgement may be clouded.  

## 2023-06-06 NOTE — ED Triage Notes (Signed)
dental pain on the left upper portion of the mouth. Sore for a few days and worse this morning.   Hurts to eat, swallow, and talk. Was seen by dental and informed the teeth need to be pulled.

## 2023-06-20 ENCOUNTER — Telehealth: Payer: Self-pay | Admitting: Family

## 2023-06-20 NOTE — Telephone Encounter (Signed)
Patient dropped off document FMLA, to be filled out by provider. Patient requested to send it back via Call Patient to pick up within 5-days. Document is located in providers tray at front office.Please advise at Mobile [phone] (mobile)

## 2023-06-24 NOTE — Telephone Encounter (Signed)
Recieved

## 2023-06-25 ENCOUNTER — Telehealth: Payer: Self-pay

## 2023-06-25 NOTE — Telephone Encounter (Signed)
I called pt and LVM in regards to [facility] paper work received.
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
## 2023-06-28 ENCOUNTER — Other Ambulatory Visit: Payer: Self-pay | Admitting: Family

## 2023-06-28 DIAGNOSIS — J438 Other emphysema: Secondary | ICD-10-CM

## 2023-06-28 NOTE — Telephone Encounter (Signed)
LVM for pt in regards.

## 2023-06-28 NOTE — Telephone Encounter (Signed)
Patient returned call. Requests to be called (on weekdays Patient can be reached after 2:00 pm).

## 2023-07-01 ENCOUNTER — Ambulatory Visit (INDEPENDENT_AMBULATORY_CARE_PROVIDER_SITE_OTHER): Payer: BC Managed Care – PPO | Admitting: Family

## 2023-07-01 VITALS — BP 117/77 | HR 71 | Temp 98.0°F | Ht 67.0 in | Wt 183.2 lb

## 2023-07-01 DIAGNOSIS — R5383 Other fatigue: Secondary | ICD-10-CM

## 2023-07-01 DIAGNOSIS — R197 Diarrhea, unspecified: Secondary | ICD-10-CM

## 2023-07-01 MED ORDER — DICYCLOMINE HCL 20 MG PO TABS
20.0000 mg | ORAL_TABLET | Freq: Three times a day (TID) | ORAL | 0 refills | Status: DC
Start: 2023-07-01 — End: 2023-09-13

## 2023-07-01 NOTE — Telephone Encounter (Signed)
Seen today by Dulce Sellar, NP at 4:20 pm

## 2023-07-01 NOTE — Progress Notes (Unsigned)
Patient ID: Sara Booth, female    DOB: 1975/11/17, 47 y.o.   MRN: [id number]  Chief Complaint  Patient presents with  . FMLA  . Abdominal Pain    sx for about a month    HPI: Diarrhea & cramping:  Pt c/o Abdominal pain and diarrhea with mucus for a month, pt has moist flatulence, states she has had mucus & blood every time she has a loose stool, having 4-5 loose/watery stools daily, and happens after every time she eats. Has tried imodium - started on Friday, had to take 2 pills at time which helped but can only take 4 per day, and switched to taking 1 pill 4x/day, stopped working as well. Pt reports this problem off & on over the past years, but never seen by GI.   Fatigue:  states she feels very tired and can't get out of bed, happens just every other month or so. pt works in [employer], states the air conditioning broke, not in a hurry to fix, gets over heated despite drinking a gallon of water. Reports she has stopped smoking cigs and switched to vaping.   Assessment & Plan:  Diarrhea, unspecified type -     Dicyclomine HCl; Take 1 tablet (20 mg total) by mouth 3 (three) times daily before meals.  Dispense: 60 tablet; Refill: 0 -     Ambulatory referral to Gastroenterology  Other fatigue    Subjective:    Outpatient Medications Prior to Visit  Medication Sig Dispense Refill  . albuterol (VENTOLIN HFA) 108 (90 Base) MCG/ACT inhaler Use 2 puffs every 6 hours as needed 1 each 5  . BREZTRI AEROSPHERE 160-9-4.8 MCG/ACT AERO USE 2 INHALATIONS BY MOUTH IN  THE MORNING AND AT BEDTIME 10.7 g 11  . budesonide-formoterol (SYMBICORT) 160-4.5 MCG/ACT inhaler Inhale 2 puffs into the lungs 2 (two) times daily. 10.2 g 5  . CAFFEINE PO Take by mouth.    . clindamycin (CLEOCIN) 300 MG capsule Take 1 capsule (300 mg total) by mouth 3 (three) times daily. 30 capsule 0  . HYDROcodone-acetaminophen (NORCO/VICODIN) 5-325 MG tablet Take 1 tablet by mouth every 6 (six) hours as needed for moderate pain  or severe pain. 8 tablet 0  . ibuprofen (ADVIL) 200 MG tablet Take 200 mg by mouth every 6 (six) hours as needed for moderate pain.    Marland Kitchen ipratropium (ATROVENT) 0.03 % nasal spray Place 2 sprays into both nostrils every 12 (twelve) hours. 30 mL 11  . montelukast (SINGULAIR) 10 MG tablet Take 1 tablet (10 mg total) by mouth at bedtime. 90 tablet 1   No facility-administered medications prior to visit.   Past Medical History:  Diagnosis Date  . Abscess, umbilical 07/22/2015  . Amenorrhea 03/23/2013  . Anxiety   . Asthma   . Callus of foot 01/12/2011   Patient continues to trim.  Don't think these are plantar warts.  Patient has moleskin cushion which she would to try to cushion her feet.    She doesn't want to try anything else for this at this time.  Discussed possible podiatry referral, but patient moving to [place] in 4 months and states that she would like to hold off.    Will follow up in 2-3 months with her.    . IBS 07/09/2008   Qualifier: Diagnosis of   By: Burnadette Pop  MD, Trisha Mangle      . Irregular menstrual cycle 09/19/2007   Qualifier: Diagnosis of   By: Burnadette Pop  MD, Trisha Mangle      .  OBESITY, NOS 01/02/2007   Qualifier: Diagnosis of   By: Knox Royalty      . Pelvic adhesions   . Post op infection 07/15/2015  . Sinus infection 12/20/2011  . Subcutaneous abscess 07/14/2015  . TOBACCO DEPENDENCE 01/02/2007   Qualifier: Diagnosis of   By: Knox Royalty       Past Surgical History:  Procedure Laterality Date  . CHOLECYSTECTOMY    . INCISION AND DRAINAGE ABSCESS N/A 07/14/2015   Procedure: INCISION AND DRAINAGE UMBILICAL ABSCESS;  Surgeon: Lesly Dukes, MD;  Location: [facility] OR;  Service: Gynecology;  Laterality: N/A;  . LAPAROSCOPIC UNILATERAL SALPINGO OOPHERECTOMY Left 06/28/2015   Procedure: LAPAROSCOPIC LEFT SALPINGO OOPHORECTOMY;  Surgeon: Catalina Antigua, MD;  Location: [facility] ORS;  Service: Gynecology;  Laterality: Left;  . TUBAL LIGATION     Allergies  Allergen Reactions  .  Amoxicillin Other (See Comments)    Made her very hot and her lips turned blue. She also had frequent urination.  . Erythromycin     Was told not to take it in the hospital  . Latex Rash      Objective:    Physical Exam BP 117/77   Pulse 71   Temp 98 F (36.7 C) (Temporal)   Ht 5\' 7"  (1.702 m)   Wt 183 lb 4 oz (83.1 kg)   LMP 05/16/2023 (Approximate)   SpO2 98%   BMI 28.70 kg/m  Wt Readings from Last 3 Encounters:  07/01/23 183 lb 4 oz (83.1 kg)  06/06/23 168 lb 10.4 oz (76.5 kg)  05/29/23 168 lb 9.6 oz (76.5 kg)       Dulce Sellar, NP

## 2023-07-01 NOTE — Patient Instructions (Addendum)
It was very nice to see you today!  I have sent over a medicine to help your diarrhea and cramping, called Dicyclomine.  I also have sent a referral to our gastroenterology office.  Please increase your fluid intake. Hydration is key!  If you feel up to eating solid foods, try to follow the dietary guide below. These foods are gentler on the stomach.  Start a daily probiotic. Some examples include Align, Digestive Advantage, Culturelle.   Food Choices to Help Relieve Diarrhea, Adult When you have diarrhea, the foods you eat and your eating habits are very important. Choosing the right foods and drinks can help relieve diarrhea. Also, because diarrhea can last up to 7 days, you need to replace lost fluids and electrolytes (such as sodium, potassium, and chloride) in order to help prevent dehydration.  WHAT GENERAL GUIDELINES DO I NEED TO FOLLOW? Slowly drink 1 cup (8 oz) of fluid for each episode of diarrhea. If you are getting enough fluid, your urine will be clear or pale yellow. Eat starchy foods. Some good choices include white rice, white toast, pasta, low-fiber cereal, baked potatoes (without the skin), saltine crackers, and bagels. Avoid large servings of any cooked vegetables. Limit fruit to two servings per day. A serving is  cup or 1 small piece. Choose foods with less than 2 g of fiber per serving. Limit fats to less than 8 tsp (38 g) per day. Avoid fried foods. Eat foods that have probiotics in them. Probiotics can be found in certain dairy products. Avoid foods and beverages that may increase the speed at which food moves through the stomach and intestines (gastrointestinal tract). Things to avoid include: High-fiber foods, such as dried fruit, raw fruits and vegetables, nuts, seeds, and whole grain foods. Spicy foods and high-fat foods. Foods and beverages sweetened with high-fructose corn syrup, honey, or sugar alcohols such as xylitol, sorbitol, and mannitol. WHAT FOODS  ARE RECOMMENDED? Grains White rice. White, [place], or pita breads (fresh or toasted), including plain rolls, buns, or bagels. White pasta. Saltine, soda, or graham crackers. Pretzels. Low-fiber cereal. Cooked cereals made with water (such as cornmeal, farina, or cream cereals). Plain muffins. Matzo. Melba toast. Zwieback.  Vegetables Potatoes (without the skin). Strained tomato and vegetable juices. Most well-cooked and canned vegetables without seeds. Tender lettuce. Fruits Cooked or canned applesauce, apricots, cherries, fruit cocktail, grapefruit, peaches, pears, or plums. Fresh bananas, apples without skin, cherries, grapes, cantaloupe, grapefruit, peaches, oranges, or plums.  Meat and Other Protein Products Baked or boiled chicken. Eggs. Tofu. Fish. Seafood. Smooth peanut butter. Ground or well-cooked tender beef, ham, veal, lamb, pork, or poultry.  Dairy Plain yogurt, kefir, and unsweetened liquid yogurt. Lactose-free milk, buttermilk, or soy milk. Plain hard cheese. Beverages Sport drinks. Clear broths. Diluted fruit juices (except prune). Regular, caffeine-free sodas such as ginger ale. Water. Decaffeinated teas. Oral rehydration solutions. Sugar-free beverages not sweetened with sugar alcohols. Other Bouillon, broth, or soups made from recommended foods.  The items listed above may not be a complete list of recommended foods or beverages. Contact your dietitian for more options. WHAT FOODS ARE NOT RECOMMENDED? Grains Whole grain, whole wheat, bran, or rye breads, rolls, pastas, crackers, and cereals. Wild or brown rice. Cereals that contain more than 2 g of fiber per serving. Corn tortillas or taco shells. Cooked or dry oatmeal. Granola. Popcorn. Vegetables Raw vegetables. Cabbage, broccoli, Brussels sprouts, artichokes, baked beans, beet greens, corn, kale, legumes, peas, sweet potatoes, and yams. Potato skins. Cooked spinach and  cabbage. Fruits Dried fruit, including raisins and  dates. Raw fruits. Stewed or dried prunes. Fresh apples with skin, apricots, mangoes, pears, raspberries, and strawberries.  Meat and Other Protein Products Chunky peanut butter. Nuts and seeds. Beans and lentils. Tomasa Blase.  Dairy High-fat cheeses. Milk, chocolate milk, and beverages made with milk, such as milk shakes. Cream. Ice cream. Sweets and Desserts Sweet rolls, doughnuts, and sweet breads. Pancakes and waffles. Fats and Oils Butter. Cream sauces. Margarine. Salad oils. Plain salad dressings. Olives. Avocados.  Beverages Caffeinated beverages (such as coffee, tea, soda, or energy drinks). Alcoholic beverages. Fruit juices with pulp. Prune juice. Soft drinks sweetened with high-fructose corn syrup or sugar alcohols. Other Coconut. Hot sauce. Chili powder. Mayonnaise. Gravy. Cream-based or milk-based soups.  WHAT SHOULD I DO IF I BECOME DEHYDRATED? Diarrhea can sometimes lead to dehydration. Signs of dehydration include dark urine and dry mouth and skin. If you think you are dehydrated, you should rehydrate with an oral rehydration solution. These solutions can be purchased at pharmacies, retail stores, or online.  Drink -1 cup (120-240 mL) of oral rehydration solution each time you have an episode of diarrhea. If drinking this amount makes your diarrhea worse, try drinking smaller amounts more often. For example, drink 1-3 tsp (5-15 mL) every 5-10 minutes.  A general rule for staying hydrated is to drink 1-2 L of fluid per day. Talk to your health care provider about the specific amount you should be drinking each day. Drink enough fluids to keep your urine clear or pale yellow.      PLEASE NOTE:  If you had any lab tests please let us know if you have not heard back within a few days. You may see your results on MyChart before we have a chance to review them but we will give you a call once they are reviewed by [place]. If we ordered any referrals today, please let us know if you have not  heard from their office within the next week.

## 2023-07-02 ENCOUNTER — Encounter: Payer: Self-pay | Admitting: Family

## 2023-07-02 NOTE — Assessment & Plan Note (Addendum)
seen for same problem 1 month ago all labs wnl advised on Vit. B12 supplement, try to exercise,daily hydration,limited carb/sweet intake pt here today for FMLA paperwork completion, done during visit, copied & scanned to chart will continue to monitor, may look at sleep study, pt lives alone

## 2023-08-28 ENCOUNTER — Other Ambulatory Visit: Payer: Self-pay | Admitting: Family

## 2023-08-28 DIAGNOSIS — J3089 Other allergic rhinitis: Secondary | ICD-10-CM

## 2023-09-13 ENCOUNTER — Other Ambulatory Visit: Payer: Self-pay

## 2023-09-13 ENCOUNTER — Telehealth: Payer: Self-pay | Admitting: Family

## 2023-09-13 DIAGNOSIS — R197 Diarrhea, unspecified: Secondary | ICD-10-CM

## 2023-09-13 MED ORDER — DICYCLOMINE HCL 20 MG PO TABS
20.0000 mg | ORAL_TABLET | Freq: Three times a day (TID) | ORAL | 0 refills | Status: DC
Start: 2023-09-13 — End: 2023-09-18

## 2023-09-13 NOTE — Telephone Encounter (Signed)
Prescription Request  09/13/2023  LOV: 07/01/2023  What is the name of the medication or equipment?  dicyclomine (BENTYL) 20 MG tablet   Have you contacted your pharmacy to request a refill? No   Which pharmacy would you like this sent to? [facility] [place], [place] - [address] [facility] [address] [place] [address] [place] [address] Phone: [phone] Fax: [phone]   Patient notified that their request is being sent to the clinical staff for review and that they should receive a response within 2 business days.   Please advise at Mobile [phone] (mobile)

## 2023-09-18 ENCOUNTER — Ambulatory Visit: Payer: BC Managed Care – PPO | Admitting: Family

## 2023-09-18 ENCOUNTER — Encounter: Payer: Self-pay | Admitting: Family

## 2023-09-18 VITALS — BP 120/76 | HR 69 | Temp 98.0°F | Ht 67.0 in | Wt 183.5 lb

## 2023-09-18 DIAGNOSIS — J438 Other emphysema: Secondary | ICD-10-CM

## 2023-09-18 DIAGNOSIS — K580 Irritable bowel syndrome with diarrhea: Secondary | ICD-10-CM

## 2023-09-18 DIAGNOSIS — J439 Emphysema, unspecified: Secondary | ICD-10-CM | POA: Diagnosis not present

## 2023-09-18 MED ORDER — DICYCLOMINE HCL 20 MG PO TABS
20.0000 mg | ORAL_TABLET | Freq: Every day | ORAL | 1 refills | Status: DC
Start: 2023-09-18 — End: 2024-03-06

## 2023-09-18 MED ORDER — BREZTRI AEROSPHERE 160-9-4.8 MCG/ACT IN AERO
2.0000 | INHALATION_SPRAY | Freq: Two times a day (BID) | RESPIRATORY_TRACT | 3 refills | Status: DC
Start: 2023-09-18 — End: 2024-03-20

## 2023-09-18 MED ORDER — ALBUTEROL SULFATE HFA 108 (90 BASE) MCG/ACT IN AERS
INHALATION_SPRAY | RESPIRATORY_TRACT | 3 refills | Status: AC
Start: 2023-09-18 — End: ?

## 2023-09-18 NOTE — Progress Notes (Signed)
Patient ID: Sara Booth, female    DOB: February 19, 1976, 47 y.o.   MRN: [id number]  Chief Complaint  Patient presents with   COPD   Fatigue   Diarrhea   HPI: Diarrhea & cramping:  HX from 05/2023 - Pt c/o Abdominal pain and diarrhea with mucus for a month, pt has moist flatulence, states she has had mucus & blood mixed in stool every time she has a loose stool, having 4-5 loose/watery stools daily, and happens after every time she eats. Has tried imodium - started on Friday, had to take 2 pills at time which helped but can only take 4 per day, and switched to taking 1 pill 4x/day, but this does not work as well. Pt reports this problem off & on over the past years, but never seen by GI.  *Today reports she has done better with taking the Bentyl qd. Now just takes qhs and controls her sx, reports normal stool qd. Did not f/u with GI.  COPD/current smoker:  HX:  pt reports smoking 1/2-1 ppd since she was 47yo. States she has been seen in UC last month for yeast and they sent in Symbicort refills for her, but she says the pharmacy can't fill because she doesn't have a PCP.  Had been on Symbicort since after Covid, after she had the virus and could not recover for months due to her lung sx. Also uses rescue inhaler. States she works out at [employer] and needs the rescue inhaler after working out. Reports she has quit smoking on her own using e-cigs w/ nicotine for a year, but then restarted.  *Today reports she is doing well using the Breztri inhaler bid and rescue inhaler qd. States she needs the rescue d/t having a dusty work environment and does not like wearing a mask.  Assessment & Plan:  Pulmonary emphysema, unspecified emphysema type (HCC) Assessment & Plan: chronic, stable smoking for 32 yrs 1/2-1 ppd, switched to vaping several mos ago started Breztri bid last visit, pt reports doing well, less mucus, has Albuterol prn, but using daily d/t dust at work sending refills of both inhalers to mail  order - [facility] f/u 6 mos or prn  Orders: -     Albuterol Sulfate HFA; Use 2 puffs every 6 hours as needed  Dispense: 3 each; Refill: 3  Irritable bowel syndrome with diarrhea Assessment & Plan: chronic, stable seen 6 mos ago and started on Bentyl tid prn, pt reports just taking qhs and controlling her sx will hold off on GI referral for now sending refill to mail order - [facility] f/u 6 mos  Orders: -     Dicyclomine HCl; Take 1 tablet (20 mg total) by mouth at bedtime.  Dispense: 90 tablet; Refill: 1  Other emphysema (HCC) Assessment & Plan: chronic, stable smoking for 32 yrs 1/2-1 ppd, switched to vaping several mos ago started Breztri bid last visit, pt reports doing well, less mucus, has Albuterol prn, but using daily d/t dust at work sending refills of both inhalers to mail order - [facility] f/u 6 mos or prn  Orders: -     Breztri Aerosphere; Inhale 2 puffs into the lungs in the morning and at bedtime.  Dispense: 32.1 g; Refill: 3   Subjective:    Outpatient Medications Prior to Visit  Medication Sig Dispense Refill   ipratropium (ATROVENT) 0.03 % nasal spray Place 2 sprays into both nostrils every 12 (twelve) hours. 30 mL 11   albuterol (VENTOLIN  HFA) 108 (90 Base) MCG/ACT inhaler Use 2 puffs every 6 hours as needed 1 each 5   BREZTRI AEROSPHERE 160-9-4.8 MCG/ACT AERO USE 2 INHALATIONS BY MOUTH IN  THE MORNING AND AT BEDTIME 10.7 g 11   dicyclomine (BENTYL) 20 MG tablet Take 1 tablet (20 mg total) by mouth 3 (three) times daily before meals. 60 tablet 0   montelukast (SINGULAIR) 10 MG tablet TAKE 1 TABLET BY MOUTH AT  BEDTIME (Patient not taking: Reported on 09/18/2023) 90 tablet 3   budesonide-formoterol (SYMBICORT) 160-4.5 MCG/ACT inhaler Inhale 2 puffs into the lungs 2 (two) times daily. (Patient not taking: Reported on 09/18/2023) 10.2 g 5   CAFFEINE PO Take by mouth. (Patient not taking: Reported on 09/18/2023)     clindamycin (CLEOCIN) 300 MG capsule Take 1 capsule (300 mg  total) by mouth 3 (three) times daily. (Patient not taking: Reported on 09/18/2023) 30 capsule 0   HYDROcodone-acetaminophen (NORCO/VICODIN) 5-325 MG tablet Take 1 tablet by mouth every 6 (six) hours as needed for moderate pain or severe pain. (Patient not taking: Reported on 09/18/2023) 8 tablet 0   ibuprofen (ADVIL) 200 MG tablet Take 200 mg by mouth every 6 (six) hours as needed for moderate pain. (Patient not taking: Reported on 09/18/2023)     No facility-administered medications prior to visit.   Past Medical History:  Diagnosis Date   Abscess, umbilical 07/22/2015   Amenorrhea 03/23/2013   Anxiety    Asthma    Bipolar disorder (HCC) 03/03/2012   Callus of foot 01/12/2011   Patient continues to trim.  Don't think these are plantar warts.  Patient has moleskin cushion which she would to try to cushion her feet.    She doesn't want to try anything else for this at this time.  Discussed possible podiatry referral, but patient moving to [place] in 4 months and states that she would like to hold off.    Will follow up in 2-3 months with her.     IBS 07/09/2008   Qualifier: Diagnosis of   By: Burnadette Pop  MD, Trisha Mangle       Irregular menstrual cycle 09/19/2007   Qualifier: Diagnosis of   By: Burnadette Pop  MD, [place]       OBESITY, NOS 01/02/2007   Qualifier: Diagnosis of   By: Knox Royalty       Pelvic adhesions    Post op infection 07/15/2015   Sinus infection 12/20/2011   Subcutaneous abscess 07/14/2015   TOBACCO DEPENDENCE 01/02/2007   Qualifier: Diagnosis of   By: Knox Royalty       Past Surgical History:  Procedure Laterality Date   CHOLECYSTECTOMY     INCISION AND DRAINAGE ABSCESS N/A 07/14/2015   Procedure: INCISION AND DRAINAGE UMBILICAL ABSCESS;  Surgeon: Lesly Dukes, MD;  Location: [facility] OR;  Service: Gynecology;  Laterality: N/A;   LAPAROSCOPIC UNILATERAL SALPINGO OOPHERECTOMY Left 06/28/2015   Procedure: LAPAROSCOPIC LEFT SALPINGO OOPHORECTOMY;  Surgeon: Catalina Antigua, MD;   Location: [facility];  Service: Gynecology;  Laterality: Left;   TUBAL LIGATION     Allergies  Allergen Reactions   Amoxicillin Other (See Comments)    Made her very hot and her lips turned blue. She also had frequent urination.   Erythromycin     Was told not to take it in the hospital   Latex Rash      Objective:    Physical Exam Vitals and nursing note reviewed.  Constitutional:      Appearance: Normal appearance.  Cardiovascular:     Rate and Rhythm: Normal rate and regular rhythm.  Pulmonary:     Effort: Pulmonary effort is normal.     Breath sounds: Normal breath sounds.  Musculoskeletal:        General: Normal range of motion.  Skin:    General: Skin is warm and dry.  Neurological:     Mental Status: She is alert.  Psychiatric:        Mood and Affect: Mood normal.        Behavior: Behavior normal.    BP 120/76 (BP Location: Left Arm, Patient Position: Sitting, Cuff Size: Large)   Pulse 69   Temp 98 F (36.7 C) (Temporal)   Ht 5\' 7"  (1.702 m)   Wt 183 lb 8 oz (83.2 kg)   LMP 09/16/2023 (Exact Date)   SpO2 97%   BMI 28.74 kg/m  Wt Readings from Last 3 Encounters:  09/18/23 183 lb 8 oz (83.2 kg)  07/01/23 183 lb 4 oz (83.1 kg)  06/06/23 168 lb 10.4 oz (76.5 kg)       Dulce Sellar, NP

## 2023-09-18 NOTE — Assessment & Plan Note (Signed)
chronic, stable seen 6 mos ago and started on Bentyl tid prn, pt reports just taking qhs and controlling her sx will hold off on GI referral for now sending refill to mail order - [facility] f/u 6 mos

## 2023-09-18 NOTE — Assessment & Plan Note (Signed)
chronic, stable smoking for 32 yrs 1/2-1 ppd, switched to vaping several mos ago started Breztri bid last visit, pt reports doing well, less mucus, has Albuterol prn, but using daily d/t dust at work sending refills of both inhalers to mail order - [facility] f/u 6 mos or prn

## 2023-10-09 ENCOUNTER — Encounter: Payer: BC Managed Care – PPO | Admitting: Family

## 2023-10-09 NOTE — Progress Notes (Deleted)
Phone [phone]  Subjective:   Patient is a 47 y.o. female presenting for annual physical.    No chief complaint on file.   See problem oriented charting- ROS- full  review of systems was completed and negative except for: ***noted in HPI above.  The following were reviewed and entered/updated in epic: Past Medical History:  Diagnosis Date   Abscess, umbilical 07/22/2015   Amenorrhea 03/23/2013   Anxiety    Asthma    Bipolar disorder (HCC) 03/03/2012   Callus of foot 01/12/2011   Patient continues to trim.  Don't think these are plantar warts.  Patient has moleskin cushion which she would to try to cushion her feet.    She doesn't want to try anything else for this at this time.  Discussed possible podiatry referral, but patient moving to [place] in 4 months and states that she would like to hold off.    Will follow up in 2-3 months with her.     IBS 07/09/2008   Qualifier: Diagnosis of   By: Burnadette Pop  MD, Trisha Mangle       Irregular menstrual cycle 09/19/2007   Qualifier: Diagnosis of   By: Burnadette Pop  MD, Kanhka       OBESITY, NOS 01/02/2007   Qualifier: Diagnosis of   By: Knox Royalty       Pelvic adhesions    Post op infection 07/15/2015   Sinus infection 12/20/2011   Subcutaneous abscess 07/14/2015   TOBACCO DEPENDENCE 01/02/2007   Qualifier: Diagnosis of   By: Knox Royalty       Patient Active Problem List   Diagnosis Date Noted   Other fatigue 07/01/2023   Pulmonary emphysema (HCC) 03/14/2023   Vaping nicotine dependence, 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   Rosacea 09/06/2015   Dermoid cyst of left ovary    PAP SMER CERV W/HI GRADE SQUAMOUS INTRAEPITH LES 10/19/2010   Irritable bowel syndrome with diarrhea 07/09/2008   Allergic rhinitis 01/02/2007   Past Surgical History:  Procedure Laterality Date   CHOLECYSTECTOMY     INCISION AND DRAINAGE ABSCESS N/A 07/14/2015   Procedure: INCISION AND DRAINAGE UMBILICAL ABSCESS;  Surgeon: Lesly Dukes, MD;  Location: [facility];  Service: Gynecology;  Laterality: N/A;   LAPAROSCOPIC UNILATERAL SALPINGO OOPHERECTOMY Left 06/28/2015   Procedure: LAPAROSCOPIC LEFT SALPINGO OOPHORECTOMY;  Surgeon: Catalina Antigua, MD;  Location: [facility];  Service: Gynecology;  Laterality: Left;   TUBAL LIGATION      Family History  Problem Relation Age of Onset   Depression Mother    Hypertension Father    Hyperlipidemia Father    Drug abuse Father    Diabetes Father    Alcohol abuse Father    Heart disease Father    Alcohol abuse Sister    Hypertension Brother    Hyperlipidemia Brother    Heart disease Brother    Learning disabilities Daughter    Depression Daughter    Alcohol abuse Daughter    Learning disabilities Son    Cancer Maternal Grandmother        breast   Arthritis Maternal Grandmother    ADD / ADHD Maternal Grandmother    ADD / ADHD Paternal Grandfather     Medications- reviewed and updated Current Outpatient Medications  Medication Sig Dispense Refill   albuterol (VENTOLIN HFA) 108 (90 Base) MCG/ACT inhaler Use 2 puffs every 6 hours as needed 3 each 3   Budeson-Glycopyrrol-Formoterol (BREZTRI AEROSPHERE) 160-9-4.8 MCG/ACT AERO Inhale 2 puffs into the lungs in the morning and  at bedtime. 32.1 g 3   dicyclomine (BENTYL) 20 MG tablet Take 1 tablet (20 mg total) by mouth at bedtime. 90 tablet 1   ipratropium (ATROVENT) 0.03 % nasal spray Place 2 sprays into both nostrils every 12 (twelve) hours. 30 mL 11   No current facility-administered medications for this visit.    Allergies-reviewed and updated Allergies  Allergen Reactions   Amoxicillin Other (See Comments)    Made her very hot and her lips turned blue. She also had frequent urination.   Erythromycin     Was told not to take it in the hospital   Latex Rash   Singulair [Montelukast] Other (See Comments)    Dizziness    Social History   Social History Narrative   Not on file    Objective:  LMP 09/16/2023 (Exact Date)  Physical  Exam Vitals and nursing note reviewed.  Constitutional:      Appearance: Normal appearance.  HENT:     Head: Normocephalic.     Right Ear: Tympanic membrane normal.     Left Ear: Tympanic membrane normal.     Nose: Nose normal.     Mouth/Throat:     Mouth: Mucous membranes are moist.  Eyes:     Pupils: Pupils are equal, round, and reactive to light.  Cardiovascular:     Rate and Rhythm: Normal rate and regular rhythm.  Pulmonary:     Effort: Pulmonary effort is normal.     Breath sounds: Normal breath sounds.  Musculoskeletal:        General: Normal range of motion.     Cervical back: Normal range of motion.  Lymphadenopathy:     Cervical: No cervical adenopathy.  Skin:    General: Skin is warm and dry.  Neurological:     Mental Status: She is alert.  Psychiatric:        Mood and Affect: Mood normal.        Behavior: Behavior normal.       Assessment and Plan   Health Maintenance counseling: 1. Anticipatory guidance: Patient counseled regarding regular dental exams q6 months, eye exams,  avoiding smoking and second hand smoke, limiting alcohol to 1 beverage per day, no illicit drugs.   2. Risk factor reduction:  Advised patient of need for regular exercise and diet rich with fruits and vegetables to reduce risk of heart attack and stroke. Exercise- ***.  Wt Readings from Last 3 Encounters:  09/18/23 183 lb 8 oz (83.2 kg)  07/01/23 183 lb 4 oz (83.1 kg)  06/06/23 168 lb 10.4 oz (76.5 kg)   3. Immunizations/screenings/ancillary studies Immunization History  Administered Date(s) Administered   Influenza Whole 09/24/2007, 08/13/2008   Td 06/09/2004   Tdap 05/29/2023   Health Maintenance Due  Topic Date Due   HIV Screening  Never done   Cervical Cancer Screening (HPV/Pap Cotest)  10/06/2015    4. Cervical cancer screening- *** 5. Breast cancer screening-  mammogram *** 6. Colon cancer screening - *** 7. Skin cancer screening- advised regular sunscreen use. Denies  worrisome, changing, or new skin lesions.  8. Birth control/STD check- *** 9. Osteoporosis screening- *** 10. Alcohol screening: *** 11. Smoking associated screening (lung cancer screening, AAA screen 65-75, UA)- *** smoker- ***ppd  There are no diagnoses linked to this encounter.  Recommended follow up: ***No follow-ups on file. Future Appointments  Date Time Provider Department Center  10/09/2023  8:30 AM Dulce Sellar, NP [facility]  03/18/2024  3:00 PM Qaadir Kent,  Judeth Cornfield, NP [facility]    Lab/Order associations:fasting   Dulce Sellar, NP

## 2023-10-09 NOTE — Patient Instructions (Incomplete)
It was very nice to see you today!   I will review your lab results via MyChart in a few days.  Have a blessed Christmas!      PLEASE NOTE:  If you had any lab tests please let us know if you have not heard back within a few days. You may see your results on MyChart before we have a chance to review them but we will give you a call once they are reviewed by [place]. If we ordered any referrals today, please let us know if you have not heard from their office within the next week.

## 2023-12-23 ENCOUNTER — Ambulatory Visit (HOSPITAL_COMMUNITY)
Admission: EM | Admit: 2023-12-23 | Discharge: 2023-12-23 | Disposition: A | Discharge status: Home / Self Care | Payer: BC Managed Care – PPO

## 2023-12-23 ENCOUNTER — Encounter (HOSPITAL_COMMUNITY): Payer: Self-pay

## 2023-12-23 DIAGNOSIS — J069 Acute upper respiratory infection, unspecified: Secondary | ICD-10-CM

## 2023-12-23 NOTE — ED Provider Notes (Signed)
[facility]    CSN: [id number] Arrival date & time: 12/23/23  1423      History   Chief Complaint Chief Complaint  Patient presents with   Diarrhea   Sore Throat   Otalgia   Chills   Fever   Fatigue   Headache    HPI Sara Booth is a 48 y.o. female.   HPI  She states she is concerned she may have the flu She states symptoms started with diarrhea and then progressed to sore throat and dry coughing She reports symptoms started on Friday  She reports she has developed ear pain and left-sided sore throat as well She states she has a previous hx of COPD  She denies increased use of rescue inhalers since being sick  Interventions: Cepacol, cough drops and Ibuprofen   Past Medical History:  Diagnosis Date   Abscess, umbilical 07/22/2015   Amenorrhea 03/23/2013   Anxiety    Asthma    Bipolar disorder (HCC) 03/03/2012   Callus of foot 01/12/2011   Patient continues to trim.  Don't think these are plantar warts.  Patient has moleskin cushion which she would to try to cushion her feet.    She doesn't want to try anything else for this at this time.  Discussed possible podiatry referral, but patient moving to [place] in 4 months and states that she would like to hold off.    Will follow up in 2-3 months with her.     IBS 07/09/2008   Qualifier: Diagnosis of   By: Burnadette Pop  MD, Trisha Mangle       Irregular menstrual cycle 09/19/2007   Qualifier: Diagnosis of   By: Burnadette Pop  MD, Kanhka       OBESITY, NOS 01/02/2007   Qualifier: Diagnosis of   By: Knox Royalty       Pelvic adhesions    Post op infection 07/15/2015   Sinus infection 12/20/2011   Subcutaneous abscess 07/14/2015   TOBACCO DEPENDENCE 01/02/2007   Qualifier: Diagnosis of   By: Knox Royalty        Patient Active Problem List   Diagnosis Date Noted   Other fatigue 07/01/2023   Pulmonary emphysema (HCC) 03/14/2023   Vaping nicotine dependence, 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   Rosacea 09/06/2015    Dermoid cyst of left ovary    PAP SMER CERV W/HI GRADE SQUAMOUS INTRAEPITH LES 10/19/2010   Irritable bowel syndrome with diarrhea 07/09/2008   Allergic rhinitis 01/02/2007    Past Surgical History:  Procedure Laterality Date   CHOLECYSTECTOMY     INCISION AND DRAINAGE ABSCESS N/A 07/14/2015   Procedure: INCISION AND DRAINAGE UMBILICAL ABSCESS;  Surgeon: Lesly Dukes, MD;  Location: [facility];  Service: Gynecology;  Laterality: N/A;   LAPAROSCOPIC UNILATERAL SALPINGO OOPHERECTOMY Left 06/28/2015   Procedure: LAPAROSCOPIC LEFT SALPINGO OOPHORECTOMY;  Surgeon: Catalina Antigua, MD;  Location: [facility];  Service: Gynecology;  Laterality: Left;   TUBAL LIGATION      OB History     Gravida  5   Para  4   Term  4   Preterm      AB  1   Living  4      SAB  1   IAB      Ectopic      Multiple      Live Births               Home Medications    Prior to Admission medications  Medication Sig Start Date End Date Taking? Authorizing Provider  albuterol (VENTOLIN HFA) 108 (90 Base) MCG/ACT inhaler Use 2 puffs every 6 hours as needed 09/18/23   Dulce Sellar, NP  Budeson-Glycopyrrol-Formoterol (BREZTRI AEROSPHERE) 160-9-4.8 MCG/ACT AERO Inhale 2 puffs into the lungs in the morning and at bedtime. 09/18/23   Dulce Sellar, NP  dicyclomine (BENTYL) 20 MG tablet Take 1 tablet (20 mg total) by mouth at bedtime. 09/18/23   Dulce Sellar, NP  ipratropium (ATROVENT) 0.03 % nasal spray Place 2 sprays into both nostrils every 12 (twelve) hours. 03/14/23   Dulce Sellar, NP    Family History Family History  Problem Relation Age of Onset   Depression Mother    Hypertension Father    Hyperlipidemia Father    Drug abuse Father    Diabetes Father    Alcohol abuse Father    Heart disease Father    Alcohol abuse Sister    Hypertension Brother    Hyperlipidemia Brother    Heart disease Brother    Learning disabilities Daughter    Depression Daughter    Alcohol  abuse Daughter    Learning disabilities Son    Cancer Maternal Grandmother        breast   Arthritis Maternal Grandmother    ADD / ADHD Maternal Grandmother    ADD / ADHD Paternal Grandfather     Social History Social History   Tobacco Use   Smoking status: Former    Current packs/day: 1.00    Types: Cigarettes   Smokeless tobacco: Never  Vaping Use   Vaping status: Some Days   Substances: Flavoring  Substance Use Topics   Alcohol use: No    Alcohol/week: 0.0 standard drinks of alcohol   Drug use: No    Comment: uses E-cigs without the nicotine     Allergies   Amoxicillin, Erythromycin, Latex, and Singulair [montelukast]   Review of Systems Review of Systems  Constitutional:  Positive for chills, diaphoresis and fatigue. Negative for fever.  HENT:  Positive for congestion, ear pain, postnasal drip and sore throat. Negative for rhinorrhea.   Respiratory:  Positive for cough. Negative for shortness of breath and wheezing.   Gastrointestinal:  Positive for diarrhea.  Musculoskeletal:  Positive for myalgias.  Neurological:  Positive for light-headedness and headaches. Negative for dizziness.     Physical Exam Triage Vital Signs ED Triage Vitals  Encounter Vitals Group     BP 12/23/23 1635 127/77     Systolic BP Percentile --      Diastolic BP Percentile --      Pulse Rate 12/23/23 1635 69     Resp 12/23/23 1635 16     Temp 12/23/23 1635 98.4 F (36.9 C)     Temp Source 12/23/23 1635 Oral     SpO2 12/23/23 1635 96 %     Weight --      Height --      Head Circumference --      Peak Flow --      Pain Score 12/23/23 1634 6     Pain Loc --      Pain Education --      Exclude from Growth Chart --    No data found.  Updated Vital Signs BP 127/77 (BP Location: Right Arm)   Pulse 69   Temp 98.4 F (36.9 C) (Oral)   Resp 16   LMP 12/09/2023 (Approximate)   SpO2 96%   Visual Acuity Right Eye Distance:  Left Eye Distance:   Bilateral Distance:     Right Eye Near:   Left Eye Near:    Bilateral Near:     Physical Exam Vitals reviewed.  Constitutional:      General: She is awake.     Appearance: Normal appearance. She is well-developed and well-groomed.  HENT:     Head: Normocephalic and atraumatic.     Right Ear: Hearing, tympanic membrane and ear canal normal.     Left Ear: Hearing and ear canal normal. A middle ear effusion is present.     Mouth/Throat:     Lips: Pink.     Mouth: Mucous membranes are moist.     Pharynx: Oropharynx is clear. Uvula midline. Posterior oropharyngeal erythema and postnasal drip present. No pharyngeal swelling, oropharyngeal exudate or uvula swelling.  Cardiovascular:     Rate and Rhythm: Normal rate and regular rhythm.     Heart sounds: Normal heart sounds. No murmur heard.    No friction rub. No gallop.  Pulmonary:     Effort: Pulmonary effort is normal.     Breath sounds: Normal breath sounds. No decreased air movement. No decreased breath sounds, wheezing, rhonchi or rales.  Musculoskeletal:     Cervical back: Normal range of motion and neck supple.  Lymphadenopathy:     Head:     Right side of head: No submental, submandibular or preauricular adenopathy.     Left side of head: No submental, submandibular or preauricular adenopathy.     Cervical:     Right cervical: No superficial cervical adenopathy.    Left cervical: No superficial cervical adenopathy.     Upper Body:     Right upper body: No supraclavicular adenopathy.     Left upper body: No supraclavicular adenopathy.  Neurological:     Mental Status: She is alert.  Psychiatric:        Mood and Affect: Mood normal.        Behavior: Behavior normal. Behavior is cooperative.      UC Treatments / Results  Labs (all labs ordered are listed, but only abnormal results are displayed) Labs Reviewed - No data to display  EKG   Radiology No results found.  Procedures Procedures (including critical care time)  Medications  Ordered in UC Medications - No data to display  Initial Impression / Assessment and Plan / UC Course  I have reviewed the triage vital signs and the nursing notes.  Pertinent labs & imaging results that were available during my care of the patient were reviewed by me and considered in my medical decision making (see chart for details).      Final Clinical Impressions(s) / UC Diagnoses   Final diagnoses:  Viral upper respiratory tract infection   Acute, new concern Patient presents today with concerns for sore throat, ear pain, chills, fever, fatigue for the past 3 days.  She does report a previous history of COPD.  She denies increased shortness of breath, severe coughing or productive cough.  Vitals and physical exam are reassuring today.  She does report several potential exposures to the flu.  At this time she is outside the window for antivirals so no testing today.  Recommend over-the-counter medications for symptomatic relief.  ED and return precautions reviewed and provided in after visit summary.  Follow-up as needed for progressing or persistent symptoms     Discharge Instructions      Based on your described symptoms and the duration of symptoms it  is likely that you have a viral upper respiratory infection (often called a "cold")  Symptoms can last for 3-10 days with lingering cough and intermittent symptoms lasting weeks after that.  The goal of treatment at this time is to reduce your symptoms and discomfort    You can use over the counter medications such as Dayquil/Nyquil, AlkaSeltzer formulations, etc to provide further relief of symptoms according to the manufacturer's instructions  If preferred you can use Coricidin to manage your symptoms rather than those medications mentioned above.    If your symptoms do not improve or become worse in the next 5-7 days please make an apt at the office so we can see you  Go to the ER if you begin to have more serious symptoms  such as shortness of breath, trouble breathing, loss of consciousness, swelling around the eyes, high fever, severe lasting headaches, vision changes or neck pain/stiffness.       ED Prescriptions   None    PDMP not reviewed this encounter.   Providence Crosby, PA-C 12/23/23 1714

## 2023-12-23 NOTE — ED Triage Notes (Signed)
Patient reports that she began with diarrhea 3 days ago. Yesterday, she began having a left -sided sore throat, left ear pain, chills, fever, fatigue, and a headache.   Patient states she has been taking cough and throat lozenges and Ibuprofen at 0400 today.

## 2023-12-23 NOTE — Discharge Instructions (Signed)
   Based on your described symptoms and the duration of symptoms it is likely that you have a viral upper respiratory infection (often called a "cold")  Symptoms can last for 3-10 days with lingering cough and intermittent symptoms lasting weeks after that.  The goal of treatment at this time is to reduce your symptoms and discomfort    You can use over the counter medications such as Dayquil/Nyquil, AlkaSeltzer formulations, etc to provide further relief of symptoms according to the manufacturer's instructions  If preferred you can use Coricidin to manage your symptoms rather than those medications mentioned above.    If your symptoms do not improve or become worse in the next 5-7 days please make an apt at the office so we can see you  Go to the ER if you begin to have more serious symptoms such as shortness of breath, trouble breathing, loss of consciousness, swelling around the eyes, high fever, severe lasting headaches, vision changes or neck pain/stiffness.

## 2024-02-22 ENCOUNTER — Ambulatory Visit (HOSPITAL_COMMUNITY)
Admission: EM | Admit: 2024-02-22 | Discharge: 2024-02-22 | Disposition: A | Discharge status: Home / Self Care | Attending: Physician Assistant | Admitting: Physician Assistant

## 2024-02-22 ENCOUNTER — Other Ambulatory Visit: Payer: Self-pay

## 2024-02-22 ENCOUNTER — Encounter (HOSPITAL_COMMUNITY): Payer: Self-pay | Admitting: Emergency Medicine

## 2024-02-22 DIAGNOSIS — R0602 Shortness of breath: Secondary | ICD-10-CM

## 2024-02-22 DIAGNOSIS — J441 Chronic obstructive pulmonary disease with (acute) exacerbation: Secondary | ICD-10-CM

## 2024-02-22 DIAGNOSIS — R051 Acute cough: Secondary | ICD-10-CM | POA: Diagnosis not present

## 2024-02-22 MED ORDER — ALBUTEROL SULFATE (2.5 MG/3ML) 0.083% IN NEBU: 2.5000 mg | INHALATION_SOLUTION | Freq: Four times a day (QID) | RESPIRATORY_TRACT | 0 refills | Status: AC | PRN

## 2024-02-22 MED ORDER — IPRATROPIUM-ALBUTEROL 0.5-2.5 (3) MG/3ML IN SOLN
3.0000 mL | Freq: Once | RESPIRATORY_TRACT | Status: AC
  Administered 2024-02-22: 3 mL via RESPIRATORY_TRACT

## 2024-02-22 MED ORDER — IPRATROPIUM-ALBUTEROL 0.5-2.5 (3) MG/3ML IN SOLN
RESPIRATORY_TRACT | Status: AC
  Filled 2024-02-22: qty 3

## 2024-02-22 MED ORDER — DOXYCYCLINE HYCLATE 100 MG PO CAPS: 100.0000 mg | ORAL_CAPSULE | Freq: Two times a day (BID) | ORAL | 0 refills | Status: DC

## 2024-02-22 MED ORDER — PREDNISONE 20 MG PO TABS: 40.0000 mg | ORAL_TABLET | Freq: Every day | ORAL | 0 refills | Status: AC

## 2024-02-22 NOTE — ED Triage Notes (Signed)
 Nasal congestion and sinus pressure for the past 3 days.

## 2024-02-22 NOTE — ED Provider Notes (Signed)
 [facility]    CSN: [id number] Arrival date & time: 02/22/24  1432      History   Chief Complaint Chief Complaint  Patient presents with   Nasal Congestion    HPI Sara Booth is a 48 y.o. female.   Patient presents today with a 4-day history of URI symptoms.  She reports nasal congestion, sinus pressure, increasing cough, shortness of breath, chest tightness.  She denies any fever, chest pain, nausea, vomiting.  Reports that several people in her household were sick but they recovered very quickly and her symptoms are lingering prompting evaluation.  She has been taking prescribed allergy medicine.  She has a history of COPD and has been using her Breztri  as prescribed.  She has required increased use of albuterol  which provides only temporary improvement of symptoms.  She does not have a nebulizer at home.  She denies any recent antibiotics or steroids.  She has no concern for COVID and so declines testing today.  Denies any history of diabetes.    Past Medical History:  Diagnosis Date   Abscess, umbilical 07/22/2015   Amenorrhea 03/23/2013   Anxiety    Asthma    Bipolar disorder (HCC) 03/03/2012   Callus of foot 01/12/2011   Patient continues to trim.  Don't think these are plantar warts.  Patient has moleskin cushion which she would to try to cushion her feet.    She doesn't want to try anything else for this at this time.  Discussed possible podiatry referral, but patient moving to [place]  in 4 months and states that she would like to hold off.    Will follow up in 2-3 months with her.     IBS 07/09/2008   Qualifier: Diagnosis of   By: Jerone Moorman  MD, Sarina Curb       Irregular menstrual cycle 09/19/2007   Qualifier: Diagnosis of   By: Jerone Moorman  MD, [place]       OBESITY, NOS 01/02/2007   Qualifier: Diagnosis of   By: Winona Haw       Pelvic adhesions    Post op infection 07/15/2015   Sinus infection 12/20/2011   Subcutaneous abscess 07/14/2015   TOBACCO  DEPENDENCE 01/02/2007   Qualifier: Diagnosis of   By: Winona Haw        Patient Active Problem List   Diagnosis Date Noted   Other fatigue 07/01/2023   Pulmonary emphysema (HCC) 03/14/2023   Vaping nicotine dependence, 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   Rosacea 09/06/2015   Dermoid cyst of left ovary    PAP SMER CERV W/HI GRADE SQUAMOUS INTRAEPITH LES 10/19/2010   Irritable bowel syndrome with diarrhea 07/09/2008   Allergic rhinitis 01/02/2007    Past Surgical History:  Procedure Laterality Date   CHOLECYSTECTOMY     INCISION AND DRAINAGE ABSCESS N/A 07/14/2015   Procedure: INCISION AND DRAINAGE UMBILICAL ABSCESS;  Surgeon: Rik Chasten, MD;  Location: [facility];  Service: Gynecology;  Laterality: N/A;   LAPAROSCOPIC UNILATERAL SALPINGO OOPHERECTOMY Left 06/28/2015   Procedure: LAPAROSCOPIC LEFT SALPINGO OOPHORECTOMY;  Surgeon: Verlyn Goad, MD;  Location: [facility];  Service: Gynecology;  Laterality: Left;   TUBAL LIGATION      OB History     Gravida  5   Para  4   Term  4   Preterm      AB  1   Living  4      SAB  1   IAB      Ectopic  Multiple      Live Births               Home Medications    Prior to Admission medications   Medication Sig Start Date End Date Taking? Authorizing Provider  albuterol  (PROVENTIL ) (2.5 MG/3ML) 0.083% nebulizer solution Take 3 mLs (2.5 mg total) by nebulization every 6 (six) hours as needed for wheezing or shortness of breath. 02/22/24  Yes Gwenn Teodoro K, PA-C  doxycycline  (VIBRAMYCIN ) 100 MG capsule Take 1 capsule (100 mg total) by mouth 2 (two) times daily. 02/22/24  Yes Ivo Moga K, PA-C  predniSONE  (DELTASONE ) 20 MG tablet Take 2 tablets (40 mg total) by mouth daily for 5 days. 02/22/24 02/27/24 Yes Lyric Hoar K, PA-C  albuterol  (VENTOLIN  HFA) 108 (90 Base) MCG/ACT inhaler Use 2 puffs every 6 hours as needed 09/18/23   Versa Gore, NP  Budeson-Glycopyrrol-Formoterol  (BREZTRI  AEROSPHERE) 160-9-4.8 MCG/ACT  AERO Inhale 2 puffs into the lungs in the morning and at bedtime. 09/18/23   Versa Gore, NP  dicyclomine  (BENTYL ) 20 MG tablet Take 1 tablet (20 mg total) by mouth at bedtime. 09/18/23   Versa Gore, NP  ipratropium (ATROVENT ) 0.03 % nasal spray Place 2 sprays into both nostrils every 12 (twelve) hours. 03/14/23   Versa Gore, NP    Family History Family History  Problem Relation Age of Onset   Depression Mother    Hypertension Father    Hyperlipidemia Father    Drug abuse Father    Diabetes Father    Alcohol abuse Father    Heart disease Father    Alcohol abuse Sister    Hypertension Brother    Hyperlipidemia Brother    Heart disease Brother    Learning disabilities Daughter    Depression Daughter    Alcohol abuse Daughter    Learning disabilities Son    Cancer Maternal Grandmother        breast   Arthritis Maternal Grandmother    ADD / ADHD Maternal Grandmother    ADD / ADHD Paternal Grandfather     Social History Social History   Tobacco Use   Smoking status: Former    Current packs/day: 1.00    Types: Cigarettes   Smokeless tobacco: Never  Vaping Use   Vaping status: Some Days   Substances: Flavoring  Substance Use Topics   Alcohol use: No    Alcohol/week: 0.0 standard drinks of alcohol   Drug use: No    Comment: uses E-cigs without the nicotine     Allergies   Amoxicillin, Erythromycin, Latex, and Singulair  [montelukast ]   Review of Systems Review of Systems  Constitutional:  Positive for activity change. Negative for appetite change, fatigue and fever.  HENT:  Positive for congestion, postnasal drip and sinus pressure. Negative for sneezing and sore throat.   Respiratory:  Positive for cough, chest tightness and shortness of breath. Negative for wheezing.   Cardiovascular:  Negative for chest pain.  Gastrointestinal:  Negative for abdominal pain, diarrhea, nausea and vomiting.  Neurological:  Negative for dizziness,  light-headedness and headaches.     Physical Exam Triage Vital Signs ED Triage Vitals [02/22/24 1457]  Encounter Vitals Group     BP 126/69     Systolic BP Percentile      Diastolic BP Percentile      Pulse Rate 66     Resp 16     Temp 98.3 F (36.8 C)     Temp Source Oral     SpO2  98 %     Weight      Height      Head Circumference      Peak Flow      Pain Score 3     Pain Loc      Pain Education      Exclude from Growth Chart    No data found.  Updated Vital Signs BP 126/69 (BP Location: Right Arm)   Pulse 66   Temp 98.3 F (36.8 C) (Oral)   Resp 16   SpO2 98%   Visual Acuity Right Eye Distance:   Left Eye Distance:   Bilateral Distance:    Right Eye Near:   Left Eye Near:    Bilateral Near:     Physical Exam Vitals reviewed.  Constitutional:      General: She is awake. She is not in acute distress.    Appearance: Normal appearance. She is well-developed. She is not ill-appearing.     Comments: Very pleasant female appears stated age in no acute distress sitting comfortably in exam room  HENT:     Head: Normocephalic and atraumatic.     Right Ear: Ear canal and external ear normal. A middle ear effusion is present. Tympanic membrane is not erythematous or bulging.     Left Ear: Tympanic membrane, ear canal and external ear normal. Tympanic membrane is not erythematous or bulging.     Mouth/Throat:     Pharynx: Uvula midline. Postnasal drip present. No oropharyngeal exudate or posterior oropharyngeal erythema.  Cardiovascular:     Rate and Rhythm: Normal rate and regular rhythm.     Heart sounds: Normal heart sounds, S1 normal and S2 normal. No murmur heard. Pulmonary:     Effort: Pulmonary effort is normal.     Breath sounds: Examination of the right-lower field reveals decreased breath sounds. Examination of the left-lower field reveals decreased breath sounds. Decreased breath sounds present. No wheezing, rhonchi or rales.  Psychiatric:         Behavior: Behavior is cooperative.      UC Treatments / Results  Labs (all labs ordered are listed, but only abnormal results are displayed) Labs Reviewed - No data to display  EKG   Radiology No results found.  Procedures Procedures (including critical care time)  Medications Ordered in UC Medications  ipratropium-albuterol  (DUONEB) 0.5-2.5 (3) MG/3ML nebulizer solution 3 mL (3 mLs Nebulization Given 02/22/24 1526)    Initial Impression / Assessment and Plan / UC Course  I have reviewed the triage vital signs and the nursing notes.  Pertinent labs & imaging results that were available during my care of the patient were reviewed by me and considered in my medical decision making (see chart for details).     Patient is well-appearing, afebrile, nontoxic, nontachycardic.  Her oxygen saturation is 98%.  We did discuss symptoms utility of viral testing, however, patient is confident that this is a COPD flare and declined this today.  She does have decreased aeration of bilateral bases but this significantly proved following a dose of DuoNeb in clinic.  She was sent home with a nebulizer as her albuterol  inhaler has not been enough to manage her acute symptoms.  Albuterol  nebulizer solution was sent to the pharmacy and we discussed that this replaces her inhaler and can be used every 4-6 hours as needed for shortness of breath and coughing fits.  Given increased shortness of breath with associated sputum production we are treating for a COPD flare.  She was started on prednisone  40 mg for 5 days.  Discussed that she is not to take NSAIDs with this medication and risk of GI bleeding.  Will also start doxycycline  twice daily.  We discussed that she should avoid prolonged sun exposure while on this medication due to associated photosensitivity.  She is to rest and drink plenty of fluid.  If her symptoms not improving within a week she is to return for reevaluation.  If anything worsen she needs  to be seen immediately.  Strict return precautions given.  All questions were answered to patient satisfaction.  Final Clinical Impressions(s) / UC Diagnoses   Final diagnoses:  COPD exacerbation (HCC)  Acute cough  Shortness of breath     Discharge Instructions      I am glad you are feeling better after the medication.  We are giving you a nebulizer and I have sent an albuterol  nebulizer solution to be used every 6 hours as needed.  This will replace use of your rescue inhaler since it is the same medication.  Do not use them both at the same time.  Start prednisone  40 mg for 5 days.  Do not take NSAIDs with this medication including aspirin, ibuprofen /Advil , naproxen /Aleve .  Use doxycycline  100 mg twice daily; stay out of the sun while on this medication as it can cause you to have a bad sunburn.  Continue over-the-counter medications for symptom relief.  Follow-up with your primary care if your symptoms did not significantly improve by next week.  If anything worsens and you have fever, worsening cough, shortness of breath despite the medication, weakness you need to be seen immediately.     ED Prescriptions     Medication Sig Dispense Auth. Provider   albuterol  (PROVENTIL ) (2.5 MG/3ML) 0.083% nebulizer solution Take 3 mLs (2.5 mg total) by nebulization every 6 (six) hours as needed for wheezing or shortness of breath. 75 mL Sedale Jenifer K, PA-C   doxycycline  (VIBRAMYCIN ) 100 MG capsule Take 1 capsule (100 mg total) by mouth 2 (two) times daily. 20 capsule Barbette Mcglaun K, PA-C   predniSONE  (DELTASONE ) 20 MG tablet Take 2 tablets (40 mg total) by mouth daily for 5 days. 10 tablet Leili Eskenazi K, PA-C      PDMP not reviewed this encounter.   Budd Cargo, PA-C 02/22/24 1554

## 2024-02-22 NOTE — Discharge Instructions (Signed)
 I am glad you are feeling better after the medication.  We are giving you a nebulizer and I have sent an albuterol  nebulizer solution to be used every 6 hours as needed.  This will replace use of your rescue inhaler since it is the same medication.  Do not use them both at the same time.  Start prednisone  40 mg for 5 days.  Do not take NSAIDs with this medication including aspirin, ibuprofen /Advil , naproxen /Aleve .  Use doxycycline  100 mg twice daily; stay out of the sun while on this medication as it can cause you to have a bad sunburn.  Continue over-the-counter medications for symptom relief.  Follow-up with your primary care if your symptoms did not significantly improve by next week.  If anything worsens and you have fever, worsening cough, shortness of breath despite the medication, weakness you need to be seen immediately.

## 2024-03-06 ENCOUNTER — Encounter (HOSPITAL_COMMUNITY): Payer: Self-pay

## 2024-03-06 ENCOUNTER — Other Ambulatory Visit: Payer: Self-pay

## 2024-03-06 ENCOUNTER — Encounter (HOSPITAL_COMMUNITY): Payer: Self-pay | Admitting: Emergency Medicine

## 2024-03-06 ENCOUNTER — Emergency Department (HOSPITAL_COMMUNITY)
Admission: EM | Admit: 2024-03-06 | Discharge: 2024-03-06 | Disposition: A | Discharge status: Home / Self Care | Attending: Emergency Medicine | Admitting: Emergency Medicine

## 2024-03-06 ENCOUNTER — Ambulatory Visit (HOSPITAL_COMMUNITY)
Admission: EM | Admit: 2024-03-06 | Discharge: 2024-03-06 | Disposition: A | Discharge status: Home / Self Care | Attending: Physician Assistant | Admitting: Physician Assistant

## 2024-03-06 DIAGNOSIS — R110 Nausea: Secondary | ICD-10-CM

## 2024-03-06 DIAGNOSIS — Z91040 Latex allergy status: Secondary | ICD-10-CM | POA: Insufficient documentation

## 2024-03-06 DIAGNOSIS — Z3202 Encounter for pregnancy test, result negative: Secondary | ICD-10-CM

## 2024-03-06 DIAGNOSIS — G8929 Other chronic pain: Secondary | ICD-10-CM | POA: Insufficient documentation

## 2024-03-06 DIAGNOSIS — R109 Unspecified abdominal pain: Secondary | ICD-10-CM

## 2024-03-06 DIAGNOSIS — R1031 Right lower quadrant pain: Secondary | ICD-10-CM | POA: Insufficient documentation

## 2024-03-06 LAB — COMPREHENSIVE METABOLIC PANEL WITH GFR
ALT: 27 U/L (ref 0–44)
AST: 21 U/L (ref 15–41)
Albumin: 4 g/dL (ref 3.5–5.0)
Alkaline Phosphatase: 47 U/L (ref 38–126)
Anion gap: 7 (ref 5–15)
BUN: 15 mg/dL (ref 6–20)
CO2: 24 mmol/L (ref 22–32)
Calcium: 8.9 mg/dL (ref 8.9–10.3)
Chloride: 103 mmol/L (ref 98–111)
Creatinine, Ser: 0.71 mg/dL (ref 0.44–1.00)
GFR, Estimated: >60 mL/min (ref >60)
Glucose, Bld: 116 mg/dL — ABNORMAL HIGH (ref 70–99)
Potassium: 4 mmol/L (ref 3.5–5.1)
Sodium: 134 mmol/L — ABNORMAL LOW (ref 135–145)
Total Bilirubin: 1.3 mg/dL — ABNORMAL HIGH (ref 0.0–1.2)
Total Protein: 6.4 g/dL — ABNORMAL LOW (ref 6.5–8.1)

## 2024-03-06 LAB — CBC WITH DIFFERENTIAL/PLATELET
Abs Immature Granulocytes: 0.05 10*3/uL (ref 0.00–0.07)
Basophils Absolute: 0.1 10*3/uL (ref 0.0–0.1)
Basophils Relative: 1 %
Eosinophils Absolute: 0 10*3/uL (ref 0.0–0.5)
Eosinophils Relative: 0 %
HCT: 40.8 % (ref 36.0–46.0)
Hemoglobin: 14.1 g/dL (ref 12.0–15.0)
Immature Granulocytes: 1 %
Lymphocytes Relative: 11 %
Lymphs Abs: 1.1 10*3/uL (ref 0.7–4.0)
MCH: 32.3 pg (ref 26.0–34.0)
MCHC: 34.6 g/dL (ref 30.0–36.0)
MCV: 93.6 fL (ref 80.0–100.0)
Monocytes Absolute: 0.5 10*3/uL (ref 0.1–1.0)
Monocytes Relative: 5 %
Neutro Abs: 8.2 10*3/uL — ABNORMAL HIGH (ref 1.7–7.7)
Neutrophils Relative %: 82 %
Platelets: 226 10*3/uL (ref 150–400)
RBC: 4.36 MIL/uL (ref 3.87–5.11)
RDW: 12.4 % (ref 11.5–15.5)
WBC: 9.9 10*3/uL (ref 4.0–10.5)
nRBC: 0 % (ref 0.0–0.2)

## 2024-03-06 LAB — URINALYSIS, ROUTINE W REFLEX MICROSCOPIC
Bilirubin Urine: NEGATIVE
Glucose, UA: NEGATIVE mg/dL
Hgb urine dipstick: NEGATIVE
Ketones, ur: NEGATIVE mg/dL
Leukocytes,Ua: NEGATIVE
Nitrite: NEGATIVE
Protein, ur: NEGATIVE mg/dL
Specific Gravity, Urine: 1.005 (ref 1.005–1.030)
pH: 7 (ref 5.0–8.0)

## 2024-03-06 LAB — POCT URINALYSIS DIP (MANUAL ENTRY)
Bilirubin, UA: NEGATIVE
Blood, UA: NEGATIVE
Glucose, UA: NEGATIVE mg/dL
Ketones, POC UA: NEGATIVE mg/dL
Leukocytes, UA: NEGATIVE
Nitrite, UA: NEGATIVE
Protein Ur, POC: NEGATIVE mg/dL
Spec Grav, UA: 1.025 (ref 1.010–1.025)
Urobilinogen, UA: 0.2 U/dL
pH, UA: 5.5 (ref 5.0–8.0)

## 2024-03-06 LAB — PREGNANCY, URINE: Preg Test, Ur: NEGATIVE

## 2024-03-06 LAB — LIPASE, BLOOD: Lipase: 30 U/L (ref 11–51)

## 2024-03-06 LAB — POCT URINE PREGNANCY: Preg Test, Ur: NEGATIVE

## 2024-03-06 MED ORDER — ONDANSETRON HCL 4 MG/2ML IJ SOLN
4.0000 mg | Freq: Once | INTRAMUSCULAR | Status: AC
  Administered 2024-03-06: 4 mg via INTRAVENOUS
  Filled 2024-03-06: qty 2

## 2024-03-06 MED ORDER — FENTANYL CITRATE PF 50 MCG/ML IJ SOSY
50.0000 ug | PREFILLED_SYRINGE | Freq: Once | INTRAMUSCULAR | Status: AC
  Administered 2024-03-06: 50 ug via INTRAVENOUS
  Filled 2024-03-06: qty 1

## 2024-03-06 NOTE — ED Triage Notes (Signed)
 Pt reports RLQ abdominal pain with n/v that started this morning. Pt sent from UC for further workup.

## 2024-03-06 NOTE — Discharge Instructions (Addendum)
 Your workup is reassuring today.  Your kidney, liver, and pancreas labs are normal.  You did not have any signs of infection.  Blood work.  Your electrolytes were normal.  As discussed, since this has been on and off issue, I recommend following up with gastroenterology.  I included their contact information below.  Please call make an appointment.  You can also contact your PCP to get a referral.  You may use Tylenol  and ibuprofen  at home as needed for pain.  Return to the ER if you have any severe worsening of your abdominal pain, uncontrolled vomiting, you are no longer passing any gas or stool, fevers, any other new or concerning symptoms.

## 2024-03-06 NOTE — Discharge Instructions (Signed)
 Please go directly to the emergency room for further evaluation and management as we discussed.

## 2024-03-06 NOTE — ED Provider Notes (Signed)
 [facility]    CSN: [id number] Arrival date & time: 03/06/24  0908      History   Chief Complaint Chief Complaint  Patient presents with   Flank Pain    HPI Sara Booth is a 48 y.o. female.   Patient presents today with a 24-hour history of right lower abdominal pain.  She reports that she has had intermittent discomfort in this general region since having an abscess of her abdominal wall that required surgical drainage in 2016, however, her current pain is much more severe than typical episodes of this discomfort prompting evaluation.  She did take ibuprofen  800 mg which has provided relief of symptoms and currently her pain has improved from previous 10 to current 5 on a 0-10 pain scale, described as sharp, worse with certain movements or palpation, no relieving factors identified.  She denies any fever but did have an episode of nausea.  Denies any associated vomiting.  Reports she has had 3 bowel movements earlier today but denies any associated melena or hematochezia.  She has had left salpingo-oophorectomy but still has her right ovary.  She has also had a incision and drainage of abdominal wall abscess and cholecystectomy.  Denies appendectomy or hernia repair in this region.  She did have some dysuria but denies any frequency, urgency, hematuria.  She has chronic vaginal discharge that is at baseline.  She was treated for COPD exacerbation with doxycycline  and took her last dose of medication yesterday.  Denies additional antibiotics in the past 90 days.      Past Medical History:  Diagnosis Date   Abscess, umbilical 07/22/2015   Amenorrhea 03/23/2013   Anxiety    Asthma    Bipolar disorder (HCC) 03/03/2012   Callus of foot 01/12/2011   Patient continues to trim.  Don't think these are plantar warts.  Patient has moleskin cushion which she would to try to cushion her feet.    She doesn't want to try anything else for this at this time.  Discussed possible podiatry  referral, but patient moving to [place]  in 4 months and states that she would like to hold off.    Will follow up in 2-3 months with her.     IBS 07/09/2008   Qualifier: Diagnosis of   By: Jerone Moorman  MD, Sarina Curb       Irregular menstrual cycle 09/19/2007   Qualifier: Diagnosis of   By: Jerone Moorman  MD, [place]       OBESITY, NOS 01/02/2007   Qualifier: Diagnosis of   By: Winona Haw       Pelvic adhesions    Post op infection 07/15/2015   Sinus infection 12/20/2011   Subcutaneous abscess 07/14/2015   TOBACCO DEPENDENCE 01/02/2007   Qualifier: Diagnosis of   By: Winona Haw        Patient Active Problem List   Diagnosis Date Noted   Other fatigue 07/01/2023   Pulmonary emphysema (HCC) 03/14/2023   Vaping nicotine dependence, 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   Rosacea 09/06/2015   Dermoid cyst of left ovary    PAP SMER CERV W/HI GRADE SQUAMOUS INTRAEPITH LES 10/19/2010   Irritable bowel syndrome with diarrhea 07/09/2008   Allergic rhinitis 01/02/2007    Past Surgical History:  Procedure Laterality Date   CHOLECYSTECTOMY     INCISION AND DRAINAGE ABSCESS N/A 07/14/2015   Procedure: INCISION AND DRAINAGE UMBILICAL ABSCESS;  Surgeon: Rik Chasten, MD;  Location: [facility];  Service: Gynecology;  Laterality: N/A;  LAPAROSCOPIC UNILATERAL SALPINGO OOPHERECTOMY Left 06/28/2015   Procedure: LAPAROSCOPIC LEFT SALPINGO OOPHORECTOMY;  Surgeon: Verlyn Goad, MD;  Location: [facility];  Service: Gynecology;  Laterality: Left;   TUBAL LIGATION      OB History     Gravida  5   Para  4   Term  4   Preterm      AB  1   Living  4      SAB  1   IAB      Ectopic      Multiple      Live Births               Home Medications    Prior to Admission medications   Medication Sig Start Date End Date Taking? Authorizing Provider  albuterol  (PROVENTIL ) (2.5 MG/3ML) 0.083% nebulizer solution Take 3 mLs (2.5 mg total) by nebulization every 6 (six) hours as needed for wheezing or  shortness of breath. 02/22/24  Yes Kiyona Mcnall K, PA-C  albuterol  (VENTOLIN  HFA) 108 (90 Base) MCG/ACT inhaler Use 2 puffs every 6 hours as needed 09/18/23  Yes Hudnell, Trevor Fudge, NP  Budeson-Glycopyrrol-Formoterol  (BREZTRI  AEROSPHERE) 160-9-4.8 MCG/ACT AERO Inhale 2 puffs into the lungs in the morning and at bedtime. 09/18/23  Yes Versa Gore, NP  ipratropium (ATROVENT ) 0.03 % nasal spray Place 2 sprays into both nostrils every 12 (twelve) hours. 03/14/23  Yes Versa Gore, NP    Family History Family History  Problem Relation Age of Onset   Depression Mother    Hypertension Father    Hyperlipidemia Father    Drug abuse Father    Diabetes Father    Alcohol abuse Father    Heart disease Father    Alcohol abuse Sister    Hypertension Brother    Hyperlipidemia Brother    Heart disease Brother    Learning disabilities Daughter    Depression Daughter    Alcohol abuse Daughter    Learning disabilities Son    Cancer Maternal Grandmother        breast   Arthritis Maternal Grandmother    ADD / ADHD Maternal Grandmother    ADD / ADHD Paternal Grandfather     Social History Social History   Tobacco Use   Smoking status: Former    Current packs/day: 1.00    Types: Cigarettes   Smokeless tobacco: Never  Vaping Use   Vaping status: Some Days   Substances: Flavoring  Substance Use Topics   Alcohol use: No    Alcohol/week: 0.0 standard drinks of alcohol   Drug use: No    Comment: uses E-cigs without the nicotine     Allergies   Amoxicillin, Erythromycin, Latex, and Singulair  [montelukast ]   Review of Systems Review of Systems  Constitutional:  Positive for activity change. Negative for appetite change, fatigue and fever.  Gastrointestinal:  Positive for abdominal pain and nausea. Negative for diarrhea and vomiting.  Genitourinary:  Positive for dysuria and vaginal discharge (chronic). Negative for flank pain, frequency, hematuria, pelvic pain, urgency, vaginal  bleeding and vaginal pain.  Musculoskeletal:  Negative for arthralgias and myalgias.     Physical Exam Triage Vital Signs ED Triage Vitals  Encounter Vitals Group     BP 03/06/24 0920 121/86     Systolic BP Percentile --      Diastolic BP Percentile --      Pulse Rate 03/06/24 0920 61     Resp 03/06/24 0920 18     Temp 03/06/24 0920 98.2  F (36.8 C)     Temp Source 03/06/24 0920 Oral     SpO2 03/06/24 0920 96 %     Weight --      Height --      Head Circumference --      Peak Flow --      Pain Score 03/06/24 0918 5     Pain Loc --      Pain Education --      Exclude from Growth Chart --    No data found.  Updated Vital Signs BP 121/86 (BP Location: Right Arm)   Pulse 61   Temp 98.2 F (36.8 C) (Oral)   Resp 18   LMP 02/25/2024 (Approximate)   SpO2 96%   Visual Acuity Right Eye Distance:   Left Eye Distance:   Bilateral Distance:    Right Eye Near:   Left Eye Near:    Bilateral Near:     Physical Exam Vitals reviewed.  Constitutional:      General: She is awake. She is not in acute distress.    Appearance: Normal appearance. She is well-developed. She is not ill-appearing.     Comments: Very pleasant female appears stated age in no acute distress sitting comfortably in exam room  HENT:     Head: Normocephalic and atraumatic.  Cardiovascular:     Rate and Rhythm: Normal rate and regular rhythm.     Heart sounds: Normal heart sounds, S1 normal and S2 normal. No murmur heard. Pulmonary:     Effort: Pulmonary effort is normal.     Breath sounds: Normal breath sounds. No wheezing, rhonchi or rales.     Comments: Clear to auscultation bilaterally Abdominal:     General: Bowel sounds are normal.     Palpations: Abdomen is soft.     Tenderness: There is abdominal tenderness in the right lower quadrant. There is no right CVA tenderness, left CVA tenderness, guarding or rebound. Positive signs include McBurney's sign. Negative signs include Rovsing's sign and  psoas sign.     Comments: Tender to palpation in right lower quadrant with positive McBurney point tenderness.  Psychiatric:        Behavior: Behavior is cooperative.      UC Treatments / Results  Labs (all labs ordered are listed, but only abnormal results are displayed) Labs Reviewed  POCT URINALYSIS DIP (MANUAL ENTRY)  POCT URINE PREGNANCY    EKG   Radiology No results found.  Procedures Procedures (including critical care time)  Medications Ordered in UC Medications - No data to display  Initial Impression / Assessment and Plan / UC Course  I have reviewed the triage vital signs and the nursing notes.  Pertinent labs & imaging results that were available during my care of the patient were reviewed by me and considered in my medical decision making (see chart for details).     Patient is well-appearing, afebrile, nontoxic, nontachycardic.  She does have significant tenderness in right lower quadrant and reports that earlier her pain was rated 10 on a 0-10 pain scale.  UA was normal.  Negative pregnancy test in clinic today.  We discussed that given the severity of her pain and location she likely needs imaging that we do not have access to in urgent care.  Discussed that the safest thing to do is to go to the ER for further evaluation and management to which she expressed understanding and agreement.  She was stable at the time of discharge and safe  for private transport.  She will go directly to the ER for further evaluation and management.  Final Clinical Impressions(s) / UC Diagnoses   Final diagnoses:  RLQ abdominal pain  Nausea without vomiting     Discharge Instructions      Please go directly to the emergency room for further evaluation and management as we discussed.     ED Prescriptions   None    PDMP not reviewed this encounter.   Budd Cargo, PA-C 03/06/24 1004

## 2024-03-06 NOTE — ED Triage Notes (Signed)
 Patient presenting with right side pain onset this morning. Has a knot on the right side for years but this morning it has flared up with severe pain.  No recent falls or injuries per the Patient. Has been having slight constipation and straining with bowel movements. Slight relief of side pain with bowel movement.  Prescriptions or OTC medications tried: Yes- Ibuprofen  today at 0800    with moderate relief.

## 2024-03-06 NOTE — ED Provider Notes (Signed)
 [place] EMERGENCY DEPARTMENT AT [facility] Provider Note   CSN: [id number] Arrival date & time: 03/06/24  1013     History  Chief Complaint  Patient presents with   Abdominal Pain    Sara Booth is a 48 y.o. female with a history of cholecystectomy and salpingo-oophorectomy, presents with concern for right lower quadrant abdominal pain that has been on and off for years.  She reports that this feels like a knot, and typically gets worse when she is working out.  Reports that this morning she woke up with the pain and it was more intense than usual.  Pain has been constant.  Denies any nausea, vomiting, fevers.  Denies any dysuria, hematuria, or increased frequency.  Reports her last bowel movement was this morning, still passing gas.   Abdominal Pain      Home Medications Prior to Admission medications   Medication Sig Start Date End Date Taking? Authorizing Provider  albuterol  (PROVENTIL ) (2.5 MG/3ML) 0.083% nebulizer solution Take 3 mLs (2.5 mg total) by nebulization every 6 (six) hours as needed for wheezing or shortness of breath. 02/22/24   Raspet, Erin K, PA-C  albuterol  (VENTOLIN  HFA) 108 (90 Base) MCG/ACT inhaler Use 2 puffs every 6 hours as needed 09/18/23   Versa Gore, NP  Budeson-Glycopyrrol-Formoterol  (BREZTRI  AEROSPHERE) 160-9-4.8 MCG/ACT AERO Inhale 2 puffs into the lungs in the morning and at bedtime. 09/18/23   Versa Gore, NP  ipratropium (ATROVENT ) 0.03 % nasal spray Place 2 sprays into both nostrils every 12 (twelve) hours. 03/14/23   Versa Gore, NP      Allergies    Amoxicillin, Erythromycin, Penicillins, Latex, and Singulair  [montelukast ]    Review of Systems   Review of Systems  Gastrointestinal:  Positive for abdominal pain.    Physical Exam Updated Vital Signs BP 108/63   Pulse (!) 54   Temp 97.9 F (36.6 C) (Oral)   Resp 15   Ht 5\' 7"  (1.702 m)   Wt 87.5 kg   LMP 02/25/2024 (Approximate)   SpO2 94%   BMI  30.23 kg/m  Physical Exam Vitals and nursing note reviewed.  Constitutional:      General: She is not in acute distress.    Appearance: She is well-developed.  HENT:     Head: Normocephalic and atraumatic.  Eyes:     Conjunctiva/sclera: Conjunctivae normal.  Cardiovascular:     Rate and Rhythm: Normal rate and regular rhythm.     Heart sounds: No murmur heard. Pulmonary:     Effort: Pulmonary effort is normal. No respiratory distress.     Breath sounds: Normal breath sounds.  Abdominal:     Palpations: Abdomen is soft.     Tenderness: There is no abdominal tenderness.     Comments: Soft and nontender, no rebound or guarding No hernias noted or skin changes of the abdomen  Musculoskeletal:        General: No swelling.     Cervical back: Neck supple.  Skin:    General: Skin is warm and dry.     Capillary Refill: Capillary refill takes less than 2 seconds.  Neurological:     Mental Status: She is alert.  Psychiatric:        Mood and Affect: Mood normal.     ED Results / Procedures / Treatments   Labs (all labs ordered are listed, but only abnormal results are displayed) Labs Reviewed  CBC WITH DIFFERENTIAL/PLATELET - Abnormal; Notable for the following components:  Result Value   Neutro Abs 8.2 (*)    All other components within normal limits  COMPREHENSIVE METABOLIC PANEL WITH GFR - Abnormal; Notable for the following components:   Sodium 134 (*)    Glucose, Bld 116 (*)    Total Protein 6.4 (*)    Total Bilirubin 1.3 (*)    All other components within normal limits  URINALYSIS, ROUTINE W REFLEX MICROSCOPIC - Abnormal; Notable for the following components:   Color, Urine STRAW (*)    All other components within normal limits  LIPASE, BLOOD  PREGNANCY, URINE  HCG, SERUM, QUALITATIVE    EKG None  Radiology No results found.  Procedures Procedures    Medications Ordered in ED Medications  fentaNYL  (SUBLIMAZE ) injection 50 mcg (50 mcg Intravenous  Given 03/06/24 1058)  ondansetron  (ZOFRAN ) injection 4 mg (4 mg Intravenous Given 03/06/24 1058)    ED Course/ Medical Decision Making/ A&P                                 Medical Decision Making Amount and/or Complexity of Data Reviewed Labs: ordered.  Risk Prescription drug management.     Differential diagnosis includes but is not limited to Cholelithiasis, cholangitis, choledocholithiasis, peptic ulcer, gastritis, gastroenteritis, appendicitis, IBS, IBD, DKA, nephrolithiasis, UTI, pyelonephritis, pancreatitis, diverticulitis, mesenteric ischemia, abdominal aortic aneurysm, small bowel obstruction, volvulus, ovarian torsion and pregnancy related concerns in females of childbearing age    ED Course:  Upon initial evaluation, patient is well-appearing, stable vitals.  Reporting right lower quadrant pain that has been intermittent for years.  Was worse this morning.  On exam, abdomen is soft nontender, no rebound or guarding.  Do not appreciate any skin changes of abdomen or any hernias.  Labs Ordered: I Ordered, and personally interpreted labs.  The pertinent results include:   CBC without leukocytosis CMP without elevation in LFTs or creatinine.  Electrolytes unremarkable Lipase within normal limits Urinalysis without signs of infection Pregnancy negative   Medications Given: Fentanyl  given for pain Zofran  for nausea  Upon re-evaluation, patient reports pain has improved.  Able to tolerate p.o. intake.  Discussed that since her abdomen is soft and nontender, labs unremarkable, and this has been a chronic issue for years, have very low concern for any acute intra-abdominal pathology.  No leukocytosis or fevers to suggest intra-abdominal infection.  Still having bowel movements and passing gas, no vomiting, low concern for SBO.  Discussed that she would likely benefit from follow-up with gastroenterology.  Will provide the contact information.  She is in agreement with plan.  Stable  and appropriate for discharge home    Impression: Chronic intermittent RLQ pain  Disposition:  The patient was discharged home with instructions to follow up with GI as soon as possible for further evaluation. Their contact information was provided and she was instructed to call and make an appointment.  Tylenol  and ibuprofen  as needed for pain. Return precautions given.   This chart was dictated using voice recognition software, Dragon. Despite the best efforts of this provider to proofread and correct errors, errors may still occur which can change documentation meaning.          Final Clinical Impression(s) / ED Diagnoses Final diagnoses:  Chronic intermittent abdominal pain    Rx / DC Orders ED Discharge Orders     None         Rexie Catena, PA-C 03/06/24 1258    Zackowski,  Geralyn Knee, MD 03/06/24 1530

## 2024-03-06 NOTE — ED Notes (Signed)
 Patient is being discharged from the Urgent Care and sent to the Emergency Department via personal opperated vehicle . Per Chauncy Coral PA, patient is in need of higher level of care due to RLQ Abdominal pain. Patient is aware and verbalizes understanding of plan of care.  Vitals:   03/06/24 0920  BP: 121/86  Pulse: 61  Resp: 18  Temp: 98.2 F (36.8 C)  SpO2: 96%

## 2024-03-17 ENCOUNTER — Emergency Department (HOSPITAL_COMMUNITY)
Admission: EM | Admit: 2024-03-17 | Discharge: 2024-03-17 | Discharge status: Against Medical Advice | Attending: Emergency Medicine | Admitting: Emergency Medicine

## 2024-03-17 ENCOUNTER — Other Ambulatory Visit: Payer: Self-pay

## 2024-03-17 ENCOUNTER — Encounter (HOSPITAL_COMMUNITY): Payer: Self-pay | Admitting: Pharmacy Technician

## 2024-03-17 DIAGNOSIS — K921 Melena: Secondary | ICD-10-CM | POA: Diagnosis not present

## 2024-03-17 DIAGNOSIS — Z5321 Procedure and treatment not carried out due to patient leaving prior to being seen by health care provider: Secondary | ICD-10-CM | POA: Diagnosis not present

## 2024-03-17 DIAGNOSIS — R1031 Right lower quadrant pain: Secondary | ICD-10-CM | POA: Insufficient documentation

## 2024-03-17 LAB — URINALYSIS, ROUTINE W REFLEX MICROSCOPIC
Bilirubin Urine: NEGATIVE
Glucose, UA: NEGATIVE mg/dL
Hgb urine dipstick: NEGATIVE
Ketones, ur: 5 mg/dL — AB
Leukocytes,Ua: NEGATIVE
Nitrite: NEGATIVE
Protein, ur: NEGATIVE mg/dL
Specific Gravity, Urine: 1.021 (ref 1.005–1.030)
pH: 5 (ref 5.0–8.0)

## 2024-03-17 LAB — CBC
HCT: 43.9 % (ref 36.0–46.0)
Hemoglobin: 15 g/dL (ref 12.0–15.0)
MCH: 31.7 pg (ref 26.0–34.0)
MCHC: 34.2 g/dL (ref 30.0–36.0)
MCV: 92.8 fL (ref 80.0–100.0)
Platelets: 286 10*3/uL (ref 150–400)
RBC: 4.73 MIL/uL (ref 3.87–5.11)
RDW: 12.2 % (ref 11.5–15.5)
WBC: 11.6 10*3/uL — ABNORMAL HIGH (ref 4.0–10.5)
nRBC: 0 % (ref 0.0–0.2)

## 2024-03-17 LAB — COMPREHENSIVE METABOLIC PANEL WITH GFR
ALT: 21 U/L (ref 0–44)
AST: 23 U/L (ref 15–41)
Albumin: 4 g/dL (ref 3.5–5.0)
Alkaline Phosphatase: 53 U/L (ref 38–126)
Anion gap: 10 (ref 5–15)
BUN: 16 mg/dL (ref 6–20)
CO2: 24 mmol/L (ref 22–32)
Calcium: 9.4 mg/dL (ref 8.9–10.3)
Chloride: 101 mmol/L (ref 98–111)
Creatinine, Ser: 0.77 mg/dL (ref 0.44–1.00)
GFR, Estimated: >60 mL/min (ref >60)
Glucose, Bld: 100 mg/dL — ABNORMAL HIGH (ref 70–99)
Potassium: 4.1 mmol/L (ref 3.5–5.1)
Sodium: 135 mmol/L (ref 135–145)
Total Bilirubin: 1.2 mg/dL (ref 0.0–1.2)
Total Protein: 6.5 g/dL (ref 6.5–8.1)

## 2024-03-17 LAB — LIPASE, BLOOD: Lipase: 29 U/L (ref 11–51)

## 2024-03-17 LAB — HCG, SERUM, QUALITATIVE: Preg, Serum: NEGATIVE

## 2024-03-17 NOTE — ED Notes (Signed)
Pt called for vitals with no answer.  

## 2024-03-17 NOTE — ED Triage Notes (Signed)
 Pt here with reports of RLQ abdominal pain with bloody diarrhea.

## 2024-03-18 ENCOUNTER — Emergency Department (HOSPITAL_COMMUNITY)

## 2024-03-18 ENCOUNTER — Other Ambulatory Visit: Payer: Self-pay

## 2024-03-18 ENCOUNTER — Encounter (HOSPITAL_COMMUNITY): Payer: Self-pay

## 2024-03-18 ENCOUNTER — Ambulatory Visit: Payer: BC Managed Care – PPO | Admitting: Family

## 2024-03-18 ENCOUNTER — Emergency Department (HOSPITAL_COMMUNITY)
Admission: EM | Admit: 2024-03-18 | Discharge: 2024-03-18 | Disposition: A | Discharge status: Home / Self Care | Attending: Emergency Medicine | Admitting: Emergency Medicine

## 2024-03-18 DIAGNOSIS — J449 Chronic obstructive pulmonary disease, unspecified: Secondary | ICD-10-CM | POA: Diagnosis not present

## 2024-03-18 DIAGNOSIS — N83201 Unspecified ovarian cyst, right side: Secondary | ICD-10-CM | POA: Diagnosis not present

## 2024-03-18 DIAGNOSIS — R1031 Right lower quadrant pain: Secondary | ICD-10-CM | POA: Diagnosis present

## 2024-03-18 DIAGNOSIS — Z91040 Latex allergy status: Secondary | ICD-10-CM | POA: Insufficient documentation

## 2024-03-18 LAB — CBC WITH DIFFERENTIAL/PLATELET
Abs Immature Granulocytes: 0.03 10*3/uL (ref 0.00–0.07)
Basophils Absolute: 0 10*3/uL (ref 0.0–0.1)
Basophils Relative: 1 %
Eosinophils Absolute: 0.1 10*3/uL (ref 0.0–0.5)
Eosinophils Relative: 1 %
HCT: 44.4 % (ref 36.0–46.0)
Hemoglobin: 15.1 g/dL — ABNORMAL HIGH (ref 12.0–15.0)
Immature Granulocytes: 0 %
Lymphocytes Relative: 13 %
Lymphs Abs: 1.1 10*3/uL (ref 0.7–4.0)
MCH: 31.9 pg (ref 26.0–34.0)
MCHC: 34 g/dL (ref 30.0–36.0)
MCV: 93.7 fL (ref 80.0–100.0)
Monocytes Absolute: 0.8 10*3/uL (ref 0.1–1.0)
Monocytes Relative: 10 %
Neutro Abs: 6.1 10*3/uL (ref 1.7–7.7)
Neutrophils Relative %: 75 %
Platelets: 248 10*3/uL (ref 150–400)
RBC: 4.74 MIL/uL (ref 3.87–5.11)
RDW: 12.2 % (ref 11.5–15.5)
WBC: 8.1 10*3/uL (ref 4.0–10.5)
nRBC: 0 % (ref 0.0–0.2)

## 2024-03-18 LAB — I-STAT CHEM 8, ED
BUN: 19 mg/dL (ref 6–20)
Calcium, Ion: 1.26 mmol/L (ref 1.15–1.40)
Chloride: 103 mmol/L (ref 98–111)
Creatinine, Ser: 0.9 mg/dL (ref 0.44–1.00)
Glucose, Bld: 92 mg/dL (ref 70–99)
HCT: 44 % (ref 36.0–46.0)
Hemoglobin: 15 g/dL (ref 12.0–15.0)
Potassium: 4.7 mmol/L (ref 3.5–5.1)
Sodium: 140 mmol/L (ref 135–145)
TCO2: 25 mmol/L (ref 22–32)

## 2024-03-18 MED ORDER — ONDANSETRON HCL 4 MG/2ML IJ SOLN
4.0000 mg | Freq: Once | INTRAMUSCULAR | Status: AC
  Administered 2024-03-18: 4 mg via INTRAVENOUS
  Filled 2024-03-18: qty 2

## 2024-03-18 MED ORDER — ACETAMINOPHEN 325 MG PO TABS
650.0000 mg | ORAL_TABLET | Freq: Once | ORAL | Status: AC
  Administered 2024-03-18: 650 mg via ORAL
  Filled 2024-03-18: qty 2

## 2024-03-18 MED ORDER — IOHEXOL 350 MG/ML SOLN
75.0000 mL | Freq: Once | INTRAVENOUS | Status: AC | PRN
  Administered 2024-03-18: 75 mL via INTRAVENOUS

## 2024-03-18 MED ORDER — KETOROLAC TROMETHAMINE 15 MG/ML IJ SOLN
15.0000 mg | Freq: Once | INTRAMUSCULAR | Status: AC
  Administered 2024-03-18: 15 mg via INTRAVENOUS
  Filled 2024-03-18: qty 1

## 2024-03-18 MED ORDER — ONDANSETRON HCL 4 MG PO TABS: 4.0000 mg | ORAL_TABLET | Freq: Four times a day (QID) | ORAL | 0 refills | Status: DC

## 2024-03-18 NOTE — ED Notes (Signed)
 Patient transported to Ultrasound

## 2024-03-18 NOTE — ED Provider Notes (Signed)
 [place] EMERGENCY DEPARTMENT AT [facility] Provider Note   CSN: [id number] Arrival date & time: 03/18/24  7829     History  Chief Complaint  Patient presents with   Abdominal Pain    Sara Booth is a 48 y.o. female.  Patient is a 48 year old female with a past medical history of COPD and suspected IBS presenting to the emergency department with right lower quadrant pain.  The patient states that she has had episodes of intermittent right lower quadrant pain that her primary doctor suspected was from IBS and started her on Bentyl .  She states however the pain is starting to become more severe.  She states that her most recent episode started around 1 PM yesterday afternoon.  She states that she had several loose bowel movements and had blood when she wiped.  She states that she went to the ER last night and had labs done but left prior to being seen.  She states that she has had no fevers.  Does have some associated nausea and vomiting.  She endorses dysuria, denies any hematuria.  She reports she has had a cholecystectomy and prior oophorectomy on the left.  She states that she has never had a colonoscopy before.  Denies any known family history of IBD  The history is provided by the patient.  Abdominal Pain      Home Medications Prior to Admission medications   Medication Sig Start Date End Date Taking? Authorizing Provider  albuterol  (PROVENTIL ) (2.5 MG/3ML) 0.083% nebulizer solution Take 3 mLs (2.5 mg total) by nebulization every 6 (six) hours as needed for wheezing or shortness of breath. 02/22/24  Yes Raspet, Erin K, PA-C  albuterol  (VENTOLIN  HFA) 108 (90 Base) MCG/ACT inhaler Use 2 puffs every 6 hours as needed 09/18/23  Yes Hudnell, Trevor Fudge, NP  Budeson-Glycopyrrol-Formoterol  (BREZTRI  AEROSPHERE) 160-9-4.8 MCG/ACT AERO Inhale 2 puffs into the lungs in the morning and at bedtime. 09/18/23  Yes Versa Gore, NP  dicyclomine  (BENTYL ) 20 MG tablet Take 20 mg by  mouth every evening. 03/15/24  Yes [provider]  FIBER PO Take 1 Dose by mouth 2 (two) times daily.   Yes [provider]  ibuprofen  (ADVIL ) 200 MG tablet Take 200-800 mg by mouth every 6 (six) hours as needed for moderate pain (pain score 4-6).   Yes [provider]  ipratropium (ATROVENT ) 0.03 % nasal spray Place 2 sprays into both nostrils every 12 (twelve) hours. Patient taking differently: Place 2 sprays into both nostrils daily. 03/14/23  Yes Versa Gore, NP  montelukast  (SINGULAIR ) 10 MG tablet Take 10 mg by mouth at bedtime.   Yes [provider]  Multiple Vitamin (MULTIVITAMIN PO) Take 1 tablet by mouth daily.   Yes [provider]  Multiple Vitamins-Minerals (HAIR SKIN & NAILS PO) Take 1 tablet by mouth daily.   Yes [provider]  ondansetron  (ZOFRAN ) 4 MG tablet Take 1 tablet (4 mg total) by mouth every 6 (six) hours. 03/18/24  Yes Nora Beal, Tondra Reierson K, DO  OVER THE COUNTER MEDICATION Take 1 Dose by mouth every other day. OLLI Collagen   Yes [provider]      Allergies    Amoxicillin, Erythromycin, Penicillins, and Latex    Review of Systems   Review of Systems  Gastrointestinal:  Positive for abdominal pain.    Physical Exam Updated Vital Signs BP 111/73   Pulse (!) 54   Temp 98.2 F (36.8 C)   Resp 16  Ht 5\' 7"  (1.702 m)   Wt 88.9 kg   LMP 02/25/2024 (Approximate)   SpO2 100%   BMI 30.70 kg/m  Physical Exam Vitals and nursing note reviewed.  Constitutional:      General: She is not in acute distress.    Appearance: She is well-developed. She is obese.  HENT:     Head: Normocephalic and atraumatic.     Mouth/Throat:     Mouth: Mucous membranes are moist.  Eyes:     Extraocular Movements: Extraocular movements intact.  Cardiovascular:     Rate and Rhythm: Normal rate and regular rhythm.     Heart sounds: Normal heart sounds.  Pulmonary:     Effort: Pulmonary effort is normal.      Breath sounds: Normal breath sounds.  Abdominal:     General: Abdomen is flat.     Palpations: Abdomen is soft.     Tenderness: There is abdominal tenderness in the right lower quadrant. There is no right CVA tenderness, left CVA tenderness, guarding or rebound.  Skin:    General: Skin is warm and dry.  Neurological:     General: No focal deficit present.     Mental Status: She is alert and oriented to person, place, and time.  Psychiatric:        Mood and Affect: Mood normal.        Behavior: Behavior normal.     ED Results / Procedures / Treatments   Labs (all labs ordered are listed, but only abnormal results are displayed) Labs Reviewed  CBC WITH DIFFERENTIAL/PLATELET - Abnormal; Notable for the following components:      Result Value   Hemoglobin 15.1 (*)    All other components within normal limits  I-STAT CHEM 8, ED    EKG None  Radiology US  Pelvis Complete Result Date: 03/18/2024 CLINICAL DATA:  Right lower quadrant abdominal pain. EXAM: TRANSABDOMINAL AND TRANSVAGINAL ULTRASOUND OF PELVIS DOPPLER ULTRASOUND OF OVARIES TECHNIQUE: Both transabdominal and transvaginal ultrasound examinations of the pelvis were performed. Transabdominal technique was performed for global imaging of the pelvis including uterus, ovaries, adnexal regions, and pelvic cul-de-sac. It was necessary to proceed with endovaginal exam following the transabdominal exam to visualize the endometrium and ovaries. Color and duplex Doppler ultrasound was utilized to evaluate blood flow to the ovaries. COMPARISON:  CT abdomen pelvis dated 03/18/2024. FINDINGS: Uterus Measurements: 9.0 x 4.5 x 5.1 cm = volume: 108 mL. No fibroids or other mass visualized. Endometrium Thickness: 10 mm.  No focal abnormality visualized. Right ovary Measurements: 5.3 x 3.6 x 4.5 cm = volume: 46 mL. There is a 4.4 x 3.7 x 5.4 cm cyst in the right ovary. There is a 4 mm echogenic content likely corresponding to a tiny fatty focus on CT  which may represent pelvic fat. A small dermoid is not excluded. Echogenic tissue adjacent to the ovary may represent ovarian or adnexal tissue. Blood product is less likely. Doppler images demonstrate arterial and venous flow to the right ovary. Left ovary Oophorectomy. Other findings No abnormal free fluid. IMPRESSION: 1. A simple appearing right ovarian cyst measures up to 5.4 cm. Recommend follow-up US  in 3-6 months. Note: This recommendation does not apply to premenarchal patients or to those with increased risk (genetic, family history, elevated tumor markers or other high-risk factors) of ovarian cancer. Reference: Radiology 2019 Nov; 293(2):359-371. 2. Echogenic tissue adjacent to the right ovary likely right ovary and adnexa and less likely blood product. 3. Small fatty focus in  the right ovary may represent pelvic fat and less likely a dermoid. 4. Left oophorectomy. Electronically Signed   By: Angus Bark M.D.   On: 03/18/2024 13:47   US  Transvaginal Non-OB Result Date: 03/18/2024 CLINICAL DATA:  Right lower quadrant abdominal pain. EXAM: TRANSABDOMINAL AND TRANSVAGINAL ULTRASOUND OF PELVIS DOPPLER ULTRASOUND OF OVARIES TECHNIQUE: Both transabdominal and transvaginal ultrasound examinations of the pelvis were performed. Transabdominal technique was performed for global imaging of the pelvis including uterus, ovaries, adnexal regions, and pelvic cul-de-sac. It was necessary to proceed with endovaginal exam following the transabdominal exam to visualize the endometrium and ovaries. Color and duplex Doppler ultrasound was utilized to evaluate blood flow to the ovaries. COMPARISON:  CT abdomen pelvis dated 03/18/2024. FINDINGS: Uterus Measurements: 9.0 x 4.5 x 5.1 cm = volume: 108 mL. No fibroids or other mass visualized. Endometrium Thickness: 10 mm.  No focal abnormality visualized. Right ovary Measurements: 5.3 x 3.6 x 4.5 cm = volume: 46 mL. There is a 4.4 x 3.7 x 5.4 cm cyst in the right ovary.  There is a 4 mm echogenic content likely corresponding to a tiny fatty focus on CT which may represent pelvic fat. A small dermoid is not excluded. Echogenic tissue adjacent to the ovary may represent ovarian or adnexal tissue. Blood product is less likely. Doppler images demonstrate arterial and venous flow to the right ovary. Left ovary Oophorectomy. Other findings No abnormal free fluid. IMPRESSION: 1. A simple appearing right ovarian cyst measures up to 5.4 cm. Recommend follow-up US  in 3-6 months. Note: This recommendation does not apply to premenarchal patients or to those with increased risk (genetic, family history, elevated tumor markers or other high-risk factors) of ovarian cancer. Reference: Radiology 2019 Nov; 293(2):359-371. 2. Echogenic tissue adjacent to the right ovary likely right ovary and adnexa and less likely blood product. 3. Small fatty focus in the right ovary may represent pelvic fat and less likely a dermoid. 4. Left oophorectomy. Electronically Signed   By: Angus Bark M.D.   On: 03/18/2024 13:47   US  Art/Ven Flow Abd Pelv Doppler Result Date: 03/18/2024 CLINICAL DATA:  Right lower quadrant abdominal pain. EXAM: TRANSABDOMINAL AND TRANSVAGINAL ULTRASOUND OF PELVIS DOPPLER ULTRASOUND OF OVARIES TECHNIQUE: Both transabdominal and transvaginal ultrasound examinations of the pelvis were performed. Transabdominal technique was performed for global imaging of the pelvis including uterus, ovaries, adnexal regions, and pelvic cul-de-sac. It was necessary to proceed with endovaginal exam following the transabdominal exam to visualize the endometrium and ovaries. Color and duplex Doppler ultrasound was utilized to evaluate blood flow to the ovaries. COMPARISON:  CT abdomen pelvis dated 03/18/2024. FINDINGS: Uterus Measurements: 9.0 x 4.5 x 5.1 cm = volume: 108 mL. No fibroids or other mass visualized. Endometrium Thickness: 10 mm.  No focal abnormality visualized. Right ovary Measurements:  5.3 x 3.6 x 4.5 cm = volume: 46 mL. There is a 4.4 x 3.7 x 5.4 cm cyst in the right ovary. There is a 4 mm echogenic content likely corresponding to a tiny fatty focus on CT which may represent pelvic fat. A small dermoid is not excluded. Echogenic tissue adjacent to the ovary may represent ovarian or adnexal tissue. Blood product is less likely. Doppler images demonstrate arterial and venous flow to the right ovary. Left ovary Oophorectomy. Other findings No abnormal free fluid. IMPRESSION: 1. A simple appearing right ovarian cyst measures up to 5.4 cm. Recommend follow-up US  in 3-6 months. Note: This recommendation does not apply to premenarchal patients or to those with  increased risk (genetic, family history, elevated tumor markers or other high-risk factors) of ovarian cancer. Reference: Radiology 2019 Nov; 293(2):359-371. 2. Echogenic tissue adjacent to the right ovary likely right ovary and adnexa and less likely blood product. 3. Small fatty focus in the right ovary may represent pelvic fat and less likely a dermoid. 4. Left oophorectomy. Electronically Signed   By: Angus Bark M.D.   On: 03/18/2024 13:47   CT ABDOMEN PELVIS W CONTRAST Result Date: 03/18/2024 CLINICAL DATA:  Right lower quadrant abdominal pain. EXAM: CT ABDOMEN AND PELVIS WITH CONTRAST TECHNIQUE: Multidetector CT imaging of the abdomen and pelvis was performed using the standard protocol following bolus administration of intravenous contrast. RADIATION DOSE REDUCTION: This exam was performed according to the departmental dose-optimization program which includes automated exposure control, adjustment of the mA and/or kV according to patient size and/or use of iterative reconstruction technique. CONTRAST:  75mL OMNIPAQUE  IOHEXOL  350 MG/ML SOLN COMPARISON:  CT abdomen pelvis dated 07/14/2015. FINDINGS: Evaluation of this exam is limited due to respiratory motion. Lower chest: There are bibasilar linear atelectasis/scarring. No  intra-abdominal free air. Hepatobiliary: The liver is unremarkable. No biliary dilatation. Cholecystectomy. There is mild biliary dilatation, post cholecystectomy. No retained calcified stone noted in the central CBD. Pancreas: Unremarkable. No pancreatic ductal dilatation or surrounding inflammatory changes. Spleen: Normal in size without focal abnormality. Adrenals/Urinary Tract: The adrenal glands unremarkable. Small left renal interpolar cyst. There is no hydronephrosis on either side. There is symmetric enhancement and excretion of contrast by both kidneys. The visualized ureters and urinary bladder appear unremarkable. Stomach/Bowel: There is no bowel obstruction or active inflammation. The appendix is normal. Vascular/Lymphatic: Mild aortoiliac atherosclerotic disease. The IVC is unremarkable. No portal venous gas. Retroaortic left renal vein anatomy. There is no adenopathy. Reproductive: The uterus is grossly unremarkable. There is a 5.8 cm cyst in the right ovary. There is high attenuating content adjacent to the right ovary consistent with blood product. Correlation with clinical exam and pregnancy test recommended to exclude an ectopic pregnancy. Further evaluation with pelvic ultrasound recommended. Other: None Musculoskeletal: No acute or significant osseous findings. IMPRESSION: 1. A 5.8 cm right ovarian cyst with adjacent blood product. Further evaluation with pelvic ultrasound recommended. 2. No bowel obstruction. Normal appendix. 3.  Aortic Atherosclerosis (ICD10-I70.0). Electronically Signed   By: Angus Bark M.D.   On: 03/18/2024 12:14    Procedures Procedures    Medications Ordered in ED Medications  ondansetron  (ZOFRAN ) injection 4 mg (4 mg Intravenous Given 03/18/24 1017)  acetaminophen  (TYLENOL ) tablet 650 mg (650 mg Oral Given 03/18/24 1018)  iohexol  (OMNIPAQUE ) 350 MG/ML injection 75 mL (75 mLs Intravenous Contrast Given 03/18/24 1204)  ketorolac  (TORADOL ) 15 MG/ML injection 15  mg (15 mg Intravenous Given 03/18/24 1234)    ED Course/ Medical Decision Making/ A&P Clinical Course as of 03/18/24 1439  Wed Mar 18, 2024  1221 CTAP with 5.8 cm R ovarian cyst with associated blood product, pelvic ultrasound recommended. No other bowel abnormality. [VK]  1353 Simple appearing cyst on US , recommended outpatient follow up, more likely adnexa than blood blood products. Patient is stable for discharge home. Recommended GYN follow up. [VK]    Clinical Course User Index [VK] Kingsley, Jeremyah Jelley K, DO                                 Medical Decision Making This patient presents to the ED with chief complaint(s) of abd  pain with pertinent past medical history of COPD, IBS which further complicates the presenting complaint. The complaint involves an extensive differential diagnosis and also carries with it a high risk of complications and morbidity.    The differential diagnosis includes appendicitis, IBD, colitis, other intra-abdominal infection, GI bleed, gastroenteritis, dehydration, electrolyte abnormality, UTI  Additional history obtained: Additional history obtained from N/A Records reviewed Primary Care Documents and recent ED records  ED Course and Reassessment: On patient's arrival she is hemodynamically stable in no acute distress.  The patient had been seen in the ED about 2 weeks ago with similar symptoms and had normal labs at that time.  Labs performed last night showed a mild leukocytosis and otherwise was within normal range, urine negative for UTI, normal hemoglobin.  With her continued bloody bowel movements will repeat H&H this morning as well as a CT abdomen and pelvis as she has not had any imaging of her abdomen in several years.  Patient declined any narcotic pain medication and will be given Tylenol  and Zofran  and will be closely reassessed.  Independent labs interpretation:  The following labs were independently interpreted: within normal  range  Independent visualization of imaging: - I independently visualized the following imaging with scope of interpretation limited to determining acute life threatening conditions related to emergency care: CTAP, pelvic US , which revealed R ovarian cyst, uncomplicated  Consultation: - Consulted or discussed management/test interpretation w/ external professional: N/A  Consideration for admission or further workup: Patient has no emergent conditions requiring admission or further work-up at this time and is stable for discharge home with primary care and GYN follow-up  Social Determinants of health: N/A    Amount and/or Complexity of Data Reviewed Labs: ordered. Radiology: ordered.  Risk OTC drugs. Prescription drug management.          Final Clinical Impression(s) / ED Diagnoses Final diagnoses:  Right ovarian cyst    Rx / DC Orders ED Discharge Orders          Ordered    ondansetron  (ZOFRAN ) 4 MG tablet  Every 6 hours        03/18/24 1438              Kingsley, Gustave Lindeman K, DO 03/18/24 1439

## 2024-03-18 NOTE — ED Triage Notes (Signed)
 Pt here for RLQ abd pain that started yesterday. C/O bloody stools for 10-20 years. Denies blood in urine. C/O nausea.

## 2024-03-18 NOTE — Discharge Instructions (Signed)
 You were seen in the emergency department for your abdominal pain.  Your workup does show that you have a cyst on your right ovary.  There is no signs of this cyst twisting on itself or bleeding that could be causing your pain.  You can continue to take Tylenol  and Motrin  every 6 hours as needed for pain and can follow-up with GYN for further treatment and management.  You should also follow-up with GI as scheduled for the blood in your stool.  You should return to the emergency department if you are having significant increased pain, vomiting despite the nausea medicine, fevers or any other new or concerning symptoms.

## 2024-03-19 NOTE — Progress Notes (Unsigned)
 Patient ID: Sara Booth, female    DOB: July 29, 1976, 48 y.o.   MRN: [id number]  No chief complaint on file. needs GYN referral     Subjective:     Outpatient Medications Prior to Visit  Medication Sig Dispense Refill   albuterol  (PROVENTIL ) (2.5 MG/3ML) 0.083% nebulizer solution Take 3 mLs (2.5 mg total) by nebulization every 6 (six) hours as needed for wheezing or shortness of breath. 75 mL 0   albuterol  (VENTOLIN  HFA) 108 (90 Base) MCG/ACT inhaler Use 2 puffs every 6 hours as needed 3 each 3   Budeson-Glycopyrrol-Formoterol  (BREZTRI  AEROSPHERE) 160-9-4.8 MCG/ACT AERO Inhale 2 puffs into the lungs in the morning and at bedtime. 32.1 g 3   dicyclomine  (BENTYL ) 20 MG tablet Take 20 mg by mouth every evening.     FIBER PO Take 1 Dose by mouth 2 (two) times daily.     ibuprofen  (ADVIL ) 200 MG tablet Take 200-800 mg by mouth every 6 (six) hours as needed for moderate pain (pain score 4-6).     ipratropium (ATROVENT ) 0.03 % nasal spray Place 2 sprays into both nostrils every 12 (twelve) hours. (Patient taking differently: Place 2 sprays into both nostrils daily.) 30 mL 11   montelukast  (SINGULAIR ) 10 MG tablet Take 10 mg by mouth at bedtime.     Multiple Vitamin (MULTIVITAMIN PO) Take 1 tablet by mouth daily.     Multiple Vitamins-Minerals (HAIR SKIN & NAILS PO) Take 1 tablet by mouth daily.     ondansetron  (ZOFRAN ) 4 MG tablet Take 1 tablet (4 mg total) by mouth every 6 (six) hours. 12 tablet 0   OVER THE COUNTER MEDICATION Take 1 Dose by mouth every other day. OLLI Collagen     No facility-administered medications prior to visit.   Past Medical History:  Diagnosis Date   Abscess, umbilical 07/22/2015   Amenorrhea 03/23/2013   Anxiety    Asthma    Bipolar disorder (HCC) 03/03/2012   Callus of foot 01/12/2011   Patient continues to trim.  Don't think these are plantar warts.  Patient has moleskin cushion which she would to try to cushion her feet.    She doesn't want to try anything  else for this at this time.  Discussed possible podiatry referral, but patient moving to [place]  in 4 months and states that she would like to hold off.    Will follow up in 2-3 months with her.     IBS 07/09/2008   Qualifier: Diagnosis of   By: Jerone Moorman  MD, Sarina Curb       Irregular menstrual cycle 09/19/2007   Qualifier: Diagnosis of   By: Jerone Moorman  MD, Kanhka       OBESITY, NOS 01/02/2007   Qualifier: Diagnosis of   By: Winona Haw       Pelvic adhesions    Post op infection 07/15/2015   Sinus infection 12/20/2011   Subcutaneous abscess 07/14/2015   TOBACCO DEPENDENCE 01/02/2007   Qualifier: Diagnosis of   By: Winona Haw       Past Surgical History:  Procedure Laterality Date   CHOLECYSTECTOMY     INCISION AND DRAINAGE ABSCESS N/A 07/14/2015   Procedure: INCISION AND DRAINAGE UMBILICAL ABSCESS;  Surgeon: Rik Chasten, MD;  Location: [facility] OR;  Service: Gynecology;  Laterality: N/A;   LAPAROSCOPIC UNILATERAL SALPINGO OOPHERECTOMY Left 06/28/2015   Procedure: LAPAROSCOPIC LEFT SALPINGO OOPHORECTOMY;  Surgeon: Verlyn Goad, MD;  Location: [facility];  Service: Gynecology;  Laterality: Left;   TUBAL  LIGATION     Allergies  Allergen Reactions   Amoxicillin Other (See Comments)    Made her very hot and her lips turned blue. She also had frequent urination.   Erythromycin     Was told not to take it in the hospital   Penicillins Other (See Comments)    She can take any cillins    Correction  Pt can  Not take  cillens per pt   Latex Rash      Objective:    Physical Exam Vitals and nursing note reviewed.  Constitutional:      Appearance: Normal appearance.  Cardiovascular:     Rate and Rhythm: Normal rate and regular rhythm.  Pulmonary:     Effort: Pulmonary effort is normal.     Breath sounds: Normal breath sounds.  Musculoskeletal:        General: Normal range of motion.  Skin:    General: Skin is warm and dry.  Neurological:     Mental Status: She is alert.   Psychiatric:        Mood and Affect: Mood normal.        Behavior: Behavior normal.    LMP 02/25/2024 (Approximate)  Wt Readings from Last 3 Encounters:  03/18/24 196 lb (88.9 kg)  03/06/24 193 lb (87.5 kg)  09/18/23 183 lb 8 oz (83.2 kg)       Versa Gore, NP

## 2024-03-20 ENCOUNTER — Telehealth: Payer: Self-pay | Admitting: Family

## 2024-03-20 ENCOUNTER — Encounter: Payer: Self-pay | Admitting: Family

## 2024-03-20 ENCOUNTER — Ambulatory Visit (INDEPENDENT_AMBULATORY_CARE_PROVIDER_SITE_OTHER): Admitting: Family

## 2024-03-20 VITALS — BP 113/78 | HR 68 | Temp 98.2°F | Ht 67.0 in | Wt 200.4 lb

## 2024-03-20 DIAGNOSIS — J438 Other emphysema: Secondary | ICD-10-CM

## 2024-03-20 DIAGNOSIS — J3089 Other allergic rhinitis: Secondary | ICD-10-CM

## 2024-03-20 DIAGNOSIS — N83201 Unspecified ovarian cyst, right side: Secondary | ICD-10-CM | POA: Insufficient documentation

## 2024-03-20 MED ORDER — IPRATROPIUM BROMIDE 0.03 % NA SOLN: 2.0000 | Freq: Every day | NASAL | 2 refills | Status: DC

## 2024-03-20 MED ORDER — BREZTRI AEROSPHERE 160-9-4.8 MCG/ACT IN AERO: 2.0000 | INHALATION_SPRAY | Freq: Two times a day (BID) | RESPIRATORY_TRACT | 3 refills | Status: DC

## 2024-03-20 NOTE — Assessment & Plan Note (Signed)
 chronic, stable stopped smoking after 32 yrs 1/2-1 ppd using Breztri  bid - reports doing well, less mucus, has Albuterol  prn, but using daily d/t dust at work sending refills of Breztri  to mail order - [facility] f/u 6 mos or prn

## 2024-03-20 NOTE — Telephone Encounter (Signed)
 Prescription Request  03/20/2024  LOV: 03/20/2024  What is the name of the medication or equipment? Budeson-Glycopyrrol-Formoterol  (BREZTRI  AEROSPHERE) 160-9-4.8 MCG/ACT AERO   ipratropium (ATROVENT ) 0.03 % nasal spray   Have you contacted your pharmacy to request a refill? Yes   Which pharmacy would you like this sent to?    [facility] [place], [place] - [address] Phone: [phone]  Fax: [phone]      Patient notified that their request is being sent to the clinical staff for review and that they should receive a response within 2 business days.   Please advise at Mobile [phone] (mobile)

## 2024-04-06 ENCOUNTER — Telehealth: Payer: Self-pay

## 2024-04-06 NOTE — Telephone Encounter (Signed)
 Copied from CRM [id number]. Topic: General - Other >> Apr 06, 2024 10:20 AM Earnestine Goes B wrote: Reason for CRM: pt called to request a copy of the FMLA papers. States she turned them in and her providers does not have them. Pt states her job is going to terminate her if she does not turn in the flma papers. Please call pt back at [phone]   I called pt and lvm letting pt know we do have a copy in our system that she may come and pick up. I will leave at front desk.

## 2024-04-15 ENCOUNTER — Ambulatory Visit: Admitting: Obstetrics & Gynecology

## 2024-04-15 ENCOUNTER — Encounter: Payer: Self-pay | Admitting: Obstetrics & Gynecology

## 2024-04-15 ENCOUNTER — Other Ambulatory Visit (HOSPITAL_COMMUNITY)
Admission: RE | Admit: 2024-04-15 | Discharge: 2024-04-15 | Disposition: A | Discharge status: Home / Self Care | Source: Ambulatory Visit | Attending: Obstetrics & Gynecology | Admitting: Obstetrics & Gynecology

## 2024-04-15 VITALS — BP 113/75 | HR 60 | Ht 67.0 in | Wt 199.0 lb

## 2024-04-15 DIAGNOSIS — Z01419 Encounter for gynecological examination (general) (routine) without abnormal findings: Secondary | ICD-10-CM | POA: Diagnosis present

## 2024-04-15 DIAGNOSIS — N926 Irregular menstruation, unspecified: Secondary | ICD-10-CM | POA: Diagnosis not present

## 2024-04-15 DIAGNOSIS — Z1331 Encounter for screening for depression: Secondary | ICD-10-CM | POA: Diagnosis not present

## 2024-04-15 DIAGNOSIS — N83201 Unspecified ovarian cyst, right side: Secondary | ICD-10-CM | POA: Diagnosis not present

## 2024-04-15 MED ORDER — DROSPIRENONE-ETHINYL ESTRADIOL 3-0.02 MG PO TABS: 1.0000 | ORAL_TABLET | Freq: Every day | ORAL | 11 refills | Status: AC

## 2024-04-15 NOTE — Progress Notes (Signed)
 Patient ID: Sara Booth, female   DOB: April 14, 1976, 48 y.o.   MRN: [id number]  Chief Complaint  Patient presents with   NEW PATIENT/GYN    HPI Sara Booth is a 48 y.o. female.  [id number] Referred for evaluation of 4 cm right ovarian cyst. She was seen at ED for right LQ pain and CT and US  were done and ovarian cyst was identified HPI  Past Medical History:  Diagnosis Date   Abscess, umbilical 07/22/2015   Amenorrhea 03/23/2013   Anemia    Anxiety    Asthma    Bipolar disorder (HCC) 03/03/2012   Callus of foot 01/12/2011   Patient continues to trim.  Don't think these are plantar warts.  Patient has moleskin cushion which she would to try to cushion her feet.    She doesn't want to try anything else for this at this time.  Discussed possible podiatry referral, but patient moving to [place]  in 4 months and states that she would like to hold off.    Will follow up in 2-3 months with her.     COPD (chronic obstructive pulmonary disease) (HCC)    Dyspnea    IBS 07/09/2008   Qualifier: Diagnosis of   By: Jerone Moorman  MD, Sarina Curb       Irregular menstrual cycle 09/19/2007   Qualifier: Diagnosis of   By: Jerone Moorman  MD, Kanhka       OBESITY, NOS 01/02/2007   Qualifier: Diagnosis of   By: Winona Haw       Pelvic adhesions    Post op infection 07/15/2015   Sinus infection 12/20/2011   Subcutaneous abscess 07/14/2015   TOBACCO DEPENDENCE 01/02/2007   Qualifier: Diagnosis of   By: Winona Haw       Vaginal Pap smear, abnormal     Past Surgical History:  Procedure Laterality Date   CHOLECYSTECTOMY     INCISION AND DRAINAGE ABSCESS N/A 07/14/2015   Procedure: INCISION AND DRAINAGE UMBILICAL ABSCESS;  Surgeon: Rik Chasten, MD;  Location: [facility] OR;  Service: Gynecology;  Laterality: N/A;   LAPAROSCOPIC UNILATERAL SALPINGO OOPHERECTOMY Left 06/28/2015   Procedure: LAPAROSCOPIC LEFT SALPINGO OOPHORECTOMY;  Surgeon: Verlyn Goad, MD;  Location: [facility];  Service: Gynecology;  Laterality: Left;    TUBAL LIGATION      Family History  Problem Relation Age of Onset   Depression Mother    Hypertension Father    Hyperlipidemia Father    Drug abuse Father    Diabetes Father    Alcohol abuse Father    Heart disease Father    Alcohol abuse Sister    Hypertension Brother    Hyperlipidemia Brother    Heart disease Brother    Learning disabilities Daughter    Depression Daughter    Alcohol abuse Daughter    Learning disabilities Son    Cancer Paternal Uncle    Cancer Maternal Grandmother        breast   Arthritis Maternal Grandmother    ADD / ADHD Maternal Grandmother    ADD / ADHD Paternal Grandfather     Social History Social History   Tobacco Use   Smoking status: Former    Current packs/day: 1.00    Types: Cigarettes   Smokeless tobacco: Current  Vaping Use   Vaping status: Some Days   Substances: Flavoring  Substance Use Topics   Alcohol use: Yes    Comment: rarely   Drug use: No    Comment: uses E-cigs without the  nicotine    Allergies  Allergen Reactions   Amoxicillin Other (See Comments)    Made her very hot and her lips turned blue. She also had frequent urination.   Erythromycin     Was told not to take it in the hospital   Penicillins Other (See Comments)    She can take any cillins    Correction  Pt can  Not take  cillens per pt   Latex Rash    Current Outpatient Medications  Medication Sig Dispense Refill   drospirenone-ethinyl estradiol (YAZ) 3-0.02 MG tablet Take 1 tablet by mouth daily. 28 tablet 11   albuterol  (PROVENTIL ) (2.5 MG/3ML) 0.083% nebulizer solution Take 3 mLs (2.5 mg total) by nebulization every 6 (six) hours as needed for wheezing or shortness of breath. 75 mL 0   albuterol  (VENTOLIN  HFA) 108 (90 Base) MCG/ACT inhaler Use 2 puffs every 6 hours as needed 3 each 3   budesonide -glycopyrrolate -formoterol  (BREZTRI  AEROSPHERE) 160-9-4.8 MCG/ACT AERO inhaler Inhale 2 puffs into the lungs in the morning and at bedtime. 32.1 g 3    dicyclomine  (BENTYL ) 20 MG tablet Take 20 mg by mouth every evening.     FIBER PO Take 1 Dose by mouth 2 (two) times daily.     ibuprofen  (ADVIL ) 200 MG tablet Take 200-800 mg by mouth every 6 (six) hours as needed for moderate pain (pain score 4-6).     ipratropium (ATROVENT ) 0.03 % nasal spray Place 2 sprays into both nostrils daily. 30 mL 2   montelukast  (SINGULAIR ) 10 MG tablet Take 10 mg by mouth at bedtime.     Multiple Vitamin (MULTIVITAMIN PO) Take 1 tablet by mouth daily.     Multiple Vitamins-Minerals (HAIR SKIN & NAILS PO) Take 1 tablet by mouth daily.     ondansetron  (ZOFRAN ) 4 MG tablet Take 1 tablet (4 mg total) by mouth every 6 (six) hours. 12 tablet 0   OVER THE COUNTER MEDICATION Take 1 Dose by mouth every other day. OLLI Collagen     No current facility-administered medications for this visit.    Review of Systems Review of Systems  Constitutional: Negative.   Respiratory: Negative.    Cardiovascular: Negative.   Gastrointestinal:  Negative for abdominal distention.  Genitourinary:  Positive for pelvic pain. Negative for menstrual problem.    Blood pressure 113/75, pulse 60, height 5' 7 (1.702 m), weight 199 lb (90.3 kg), last menstrual period 04/15/2024.  Physical Exam Physical Exam Vitals and nursing note reviewed. Exam conducted with a chaperone present.  Constitutional:      Appearance: Normal appearance.  Pulmonary:     Effort: Pulmonary effort is normal.  Chest:  Breasts:    Right: Normal.     Left: Normal.  Abdominal:     General: Abdomen is flat.     Palpations: Abdomen is soft.     Tenderness: There is no abdominal tenderness. There is no guarding.  Genitourinary:    General: Normal vulva.     Exam position: Lithotomy position.     Vagina: Normal.     Cervix: Normal.     Uterus: Normal.      Adnexa: Right adnexa normal and left adnexa normal.  Neurological:     Mental Status: She is alert.  Psychiatric:        Mood and Affect: Mood normal.         Behavior: Behavior normal.     Data Reviewed Narrative & Impression  CLINICAL DATA:  Right lower  quadrant abdominal pain.   EXAM: TRANSABDOMINAL AND TRANSVAGINAL ULTRASOUND OF PELVIS   DOPPLER ULTRASOUND OF OVARIES   TECHNIQUE: Both transabdominal and transvaginal ultrasound examinations of the pelvis were performed. Transabdominal technique was performed for global imaging of the pelvis including uterus, ovaries, adnexal regions, and pelvic cul-de-sac.   It was necessary to proceed with endovaginal exam following the transabdominal exam to visualize the endometrium and ovaries. Color and duplex Doppler ultrasound was utilized to evaluate blood flow to the ovaries.   COMPARISON:  CT abdomen pelvis dated 03/18/2024.   FINDINGS: Uterus   Measurements: 9.0 x 4.5 x 5.1 cm = volume: 108 mL. No fibroids or other mass visualized.   Endometrium   Thickness: 10 mm.  No focal abnormality visualized.   Right ovary   Measurements: 5.3 x 3.6 x 4.5 cm = volume: 46 mL. There is a 4.4 x 3.7 x 5.4 cm cyst in the right ovary. There is a 4 mm echogenic content likely corresponding to a tiny fatty focus on CT which may represent pelvic fat. A small dermoid is not excluded. Echogenic tissue adjacent to the ovary may represent ovarian or adnexal tissue. Blood product is less likely. Doppler images demonstrate arterial and venous flow to the right ovary.   Left ovary   Oophorectomy.   Other findings   No abnormal free fluid.   IMPRESSION: 1. A simple appearing right ovarian cyst measures up to 5.4 cm. Recommend follow-up US  in 3-6 months. Note: This recommendation does not apply to premenarchal patients or to those with increased risk (genetic, family history, elevated tumor markers or other high-risk factors) of ovarian cancer. Reference: Radiology 2019 Nov; 293(2):359-371. 2. Echogenic tissue adjacent to the right ovary likely right ovary and adnexa and less likely  blood product. 3. Small fatty focus in the right ovary may represent pelvic fat and less likely a dermoid. 4. Left oophorectomy.     Electronically Signed   By: Angus Bark M.D.   On: 03/18/2024 13:47     CBC    Component Value Date/Time   WBC 8.1 03/18/2024 0951   RBC 4.74 03/18/2024 0951   HGB 15.0 03/18/2024 0956   HCT 44.0 03/18/2024 0956   PLT 248 03/18/2024 0951   MCV 93.7 03/18/2024 0951   MCH 31.9 03/18/2024 0951   MCHC 34.0 03/18/2024 0951   RDW 12.2 03/18/2024 0951   LYMPHSABS 1.1 03/18/2024 0951   MONOABS 0.8 03/18/2024 0951   EOSABS 0.1 03/18/2024 0951   BASOSABS 0.0 03/18/2024 0951    Assessment Women's annual routine gynecological examination [Z01.419] - Plan: Cytology - PAP( [place]), MM 3D SCREENING MAMMOGRAM BILATERAL BREAST, Ambulatory referral to [facility], drospirenone-ethinyl estradiol (YAZ) 3-0.02 MG tablet, FSH, Estrogens, Total, Testosterone Free with SHBG, US  PELVIC COMPLETE WITH TRANSVAGINAL  Right ovarian cyst - Plan: US  PELVIC COMPLETE WITH TRANSVAGINAL  Irregular menstrual bleeding perimenopause  Plan F/u repeat US  Cycle control with OCP Meds ordered this encounter  Medications   drospirenone-ethinyl estradiol (YAZ) 3-0.02 MG tablet    Sig: Take 1 tablet by mouth daily.    Dispense:  28 tablet    Refill:  11      Onnie Bilis 04/15/2024, 3:21 PM

## 2024-04-15 NOTE — Progress Notes (Signed)
 48 y.o. New GYN presents for AEX/PAP.  C/o cyst on R Ovary, pain 3/10 x 1 year.  Pt had PTV US  03/18/24, sometimes she has 2-3 periods per month.

## 2024-04-16 LAB — CYTOLOGY - PAP
Comment: NEGATIVE
Diagnosis: NEGATIVE
High risk HPV: NEGATIVE

## 2024-04-18 ENCOUNTER — Ambulatory Visit (HOSPITAL_COMMUNITY)
Admission: RE | Admit: 2024-04-18 | Discharge: 2024-04-18 | Disposition: A | Discharge status: Home / Self Care | Source: Ambulatory Visit | Attending: Obstetrics & Gynecology | Admitting: Obstetrics & Gynecology

## 2024-04-18 DIAGNOSIS — Z01419 Encounter for gynecological examination (general) (routine) without abnormal findings: Secondary | ICD-10-CM | POA: Diagnosis present

## 2024-04-18 DIAGNOSIS — N83201 Unspecified ovarian cyst, right side: Secondary | ICD-10-CM | POA: Insufficient documentation

## 2024-04-24 LAB — TESTOSTERONE, FREE AND TOTAL (INCLUDES SHBG)-(MALES)
% Free Testosterone: 0.3 %
Free Testosterone, S: 0.7 pg/mL — ABNORMAL LOW
Sex Hormone Binding Globulin: 167 nmol/L — ABNORMAL HIGH
Testosterone, Serum (Total): 22 ng/dL

## 2024-04-24 LAB — ESTROGENS, TOTAL: Estrogen: 303 pg/mL

## 2024-04-24 LAB — FOLLICLE STIMULATING HORMONE: FSH: 7 m[IU]/mL

## 2024-04-30 LAB — HM COLONOSCOPY

## 2024-05-01 ENCOUNTER — Ambulatory Visit: Payer: Self-pay | Admitting: Obstetrics & Gynecology

## 2024-05-20 ENCOUNTER — Telehealth: Payer: Self-pay

## 2024-05-21 NOTE — Telephone Encounter (Signed)
 error

## 2024-06-20 ENCOUNTER — Encounter (HOSPITAL_COMMUNITY): Payer: Self-pay

## 2024-06-20 ENCOUNTER — Ambulatory Visit (HOSPITAL_COMMUNITY)
Admission: EM | Admit: 2024-06-20 | Discharge: 2024-06-20 | Disposition: A | Discharge status: Home / Self Care | Attending: Physician Assistant | Admitting: Physician Assistant

## 2024-06-20 DIAGNOSIS — R21 Rash and other nonspecific skin eruption: Secondary | ICD-10-CM | POA: Diagnosis not present

## 2024-06-20 MED ORDER — METHYLPREDNISOLONE SODIUM SUCC 125 MG IJ SOLR
INTRAMUSCULAR | Status: AC
  Filled 2024-06-20: qty 2

## 2024-06-20 MED ORDER — METHYLPREDNISOLONE SODIUM SUCC 125 MG IJ SOLR
80.0000 mg | Freq: Once | INTRAMUSCULAR | Status: AC
  Administered 2024-06-20: 80 mg via INTRAMUSCULAR

## 2024-06-20 MED ORDER — TRIAMCINOLONE ACETONIDE 0.5 % EX CREA: 1.0000 | TOPICAL_CREAM | Freq: Three times a day (TID) | CUTANEOUS | 0 refills | Status: DC

## 2024-06-20 NOTE — ED Provider Notes (Signed)
 [facility]    CSN: [id number] Arrival date & time: 06/20/24  1741      History   Chief Complaint Chief Complaint  Patient presents with   Rash    HPI Sara Booth is a 48 y.o. female presenting for rash and itching on bilateral forearms, upper arms, upper thighs starting the last week.  Reports that the rash started a few days prior to getting new tattoos.  She reports that she has also been working outside quite often.  No new soaps or detergents that she can recall.  Her daughter does use [employer] fragrance spray and that seems to irritate her sometimes.  She denies any fever or chills.  No shortness of breath or wheezing.  Nobody else in the house is itching or has a rash.  No recent travel.  Cortisone cream over-the-counter seems to help a little bit.  Past Medical History:  Diagnosis Date   Abscess, umbilical 07/22/2015   Amenorrhea 03/23/2013   Anemia    Anxiety    Asthma    Bipolar disorder (HCC) 03/03/2012   Callus of foot 01/12/2011   Patient continues to trim.  Don't think these are plantar warts.  Patient has moleskin cushion which she would to try to cushion her feet.    She doesn't want to try anything else for this at this time.  Discussed possible podiatry referral, but patient moving to [place]  in 4 months and states that she would like to hold off.    Will follow up in 2-3 months with her.     COPD (chronic obstructive pulmonary disease) (HCC)    Dyspnea    IBS 07/09/2008   Qualifier: Diagnosis of   By: Alla  MD, Alda       Irregular menstrual cycle 09/19/2007   Qualifier: Diagnosis of   By: Alla  MD, Kanhka       OBESITY, NOS 01/02/2007   Qualifier: Diagnosis of   By: Manford Longs       Pelvic adhesions    Post op infection 07/15/2015   Sinus infection 12/20/2011   Subcutaneous abscess 07/14/2015   TOBACCO DEPENDENCE 01/02/2007   Qualifier: Diagnosis of   By: Manford Longs       Vaginal Pap smear, abnormal     Patient  Active Problem List   Diagnosis Date Noted   Right ovarian cyst 03/20/2024   Other fatigue 07/01/2023   Pulmonary emphysema (HCC) 03/14/2023   Vaping nicotine dependence, 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   Rosacea 09/06/2015   Dermoid cyst of left ovary    PAP SMER CERV W/HI GRADE SQUAMOUS INTRAEPITH LES 10/19/2010   Irritable bowel syndrome with diarrhea 07/09/2008   Allergic rhinitis 01/02/2007    Past Surgical History:  Procedure Laterality Date   CHOLECYSTECTOMY     INCISION AND DRAINAGE ABSCESS N/A 07/14/2015   Procedure: INCISION AND DRAINAGE UMBILICAL ABSCESS;  Surgeon: Burnard VEAR Pate, MD;  Location: [facility];  Service: Gynecology;  Laterality: N/A;   LAPAROSCOPIC UNILATERAL SALPINGO OOPHERECTOMY Left 06/28/2015   Procedure: LAPAROSCOPIC LEFT SALPINGO OOPHORECTOMY;  Surgeon: Winton Felt, MD;  Location: [facility];  Service: Gynecology;  Laterality: Left;   TUBAL LIGATION      OB History     Gravida  5   Para  4   Term  4   Preterm      AB  1   Living  4      SAB  1   IAB  Ectopic      Multiple      Live Births               Home Medications    Prior to Admission medications   Medication Sig Start Date End Date Taking? Authorizing Provider  triamcinolone  cream (KENALOG ) 0.5 % Apply 1 Application topically 3 (three) times daily. Apply thin layer to affected areas no longer than 2 weeks consistently. 06/20/24  Yes Shaneen Reeser M, PA-C  albuterol  (PROVENTIL ) (2.5 MG/3ML) 0.083% nebulizer solution Take 3 mLs (2.5 mg total) by nebulization every 6 (six) hours as needed for wheezing or shortness of breath. 02/22/24   Raspet, Erin K, PA-C  albuterol  (VENTOLIN  HFA) 108 (90 Base) MCG/ACT inhaler Use 2 puffs every 6 hours as needed 09/18/23   Lucius Krabbe, NP  budesonide -glycopyrrolate -formoterol  (BREZTRI  AEROSPHERE) 160-9-4.8 MCG/ACT AERO inhaler Inhale 2 puffs into the lungs in the morning and at bedtime. 03/20/24   Lucius Krabbe, NP  dicyclomine   (BENTYL ) 20 MG tablet Take 20 mg by mouth every evening. 03/15/24   [provider]  drospirenone -ethinyl estradiol  (YAZ) 3-0.02 MG tablet Take 1 tablet by mouth daily. 04/15/24   Eveline Lynwood MATSU, MD  ibuprofen  (ADVIL ) 200 MG tablet Take 200-800 mg by mouth every 6 (six) hours as needed for moderate pain (pain score 4-6).    [provider]  ipratropium (ATROVENT ) 0.03 % nasal spray Place 2 sprays into both nostrils daily. 03/20/24   Lucius Krabbe, NP  montelukast  (SINGULAIR ) 10 MG tablet Take 10 mg by mouth at bedtime.    [provider]  Multiple Vitamin (MULTIVITAMIN PO) Take 1 tablet by mouth daily.    [provider]    Family History Family History  Problem Relation Age of Onset   Depression Mother    Hypertension Father    Hyperlipidemia Father    Drug abuse Father    Diabetes Father    Alcohol abuse Father    Heart disease Father    Alcohol abuse Sister    Hypertension Brother    Hyperlipidemia Brother    Heart disease Brother    Learning disabilities Daughter    Depression Daughter    Alcohol abuse Daughter    Learning disabilities Son    Cancer Paternal Uncle    Cancer Maternal Grandmother        breast   Arthritis Maternal Grandmother    ADD / ADHD Maternal Grandmother    ADD / ADHD Paternal Grandfather     Social History Social History   Tobacco Use   Smoking status: Former    Current packs/day: 1.00    Types: Cigarettes   Smokeless tobacco: Current  Vaping Use   Vaping status: Some Days   Substances: Flavoring  Substance Use Topics   Alcohol use: Yes    Comment: rarely   Drug use: No    Comment: uses E-cigs without the nicotine     Allergies   Amoxicillin, Erythromycin, Penicillins, and Latex   Review of Systems Review of Systems  REFER TO HPI FOR PERTINENT POSITIVES AND NEGATIVES   Physical Exam Triage Vital Signs ED Triage Vitals  Encounter Vitals Group     BP      Girls Systolic BP Percentile       Girls Diastolic BP Percentile      Boys Systolic BP Percentile      Boys Diastolic BP Percentile      Pulse      Resp  Temp      Temp src      SpO2      Weight      Height      Head Circumference      Peak Flow      Pain Score      Pain Loc      Pain Education      Exclude from Growth Chart    No data found.  Updated Vital Signs BP 117/73 (BP Location: Left Arm)   Pulse 74   Temp 98.2 F (36.8 C) (Oral)   Resp 16   LMP 06/08/2024 (Approximate)   SpO2 95%    Physical Exam Vitals and nursing note reviewed.  Constitutional:      Appearance: Normal appearance.  Cardiovascular:     Rate and Rhythm: Normal rate and regular rhythm.     Pulses: Normal pulses.     Heart sounds: Normal heart sounds. No murmur heard. Pulmonary:     Effort: Pulmonary effort is normal.     Breath sounds: Normal breath sounds.  Musculoskeletal:     Right lower leg: No edema.     Left lower leg: No edema.  Skin:    Findings: Rash (erythema bilateral arms, upper thighs, evidence of scratching) present.  Neurological:     General: No focal deficit present.     Mental Status: She is alert.  Psychiatric:        Mood and Affect: Mood normal.      UC Treatments / Results  Labs (all labs ordered are listed, but only abnormal results are displayed) Labs Reviewed - No data to display  EKG   Radiology No results found.  Procedures Procedures (including critical care time)  Medications Ordered in UC Medications  methylPREDNISolone  sodium succinate (SOLU-MEDROL ) 125 mg/2 mL injection 80 mg (has no administration in time range)    Initial Impression / Assessment and Plan / UC Course  I have reviewed the triage vital signs and the nursing notes.  Pertinent labs & imaging results that were available during my care of the patient were reviewed by me and considered in my medical decision making (see chart for details).     Well-appearing 48 year old female, no acute distress,  vital signs stable. Presentation c/w nonspecific rash, likely dermatitis.  Injection of Solu-Medrol  provided in the urgent care tonight.  Prescription for triamcinolone  cream to use at home as directed.  Instructions to keep cool and hydrated.  She can take Benadryl  as needed.  Cetirizine 10 mg daily for the next few weeks may help as well.  She needs to monitor her symptoms and follow-up with her primary care if worse or not improving.  All questions answered.  Patient agreeable and understanding of plan.  Final Clinical Impressions(s) / UC Diagnoses   Final diagnoses:  Rash and nonspecific skin eruption     Discharge Instructions      Good to meet you today.  You appear to be having a reaction to something in the environment, whether poison oak allergens or tattoos or soaps, etc.  You were given a steroid injection at the urgent care tonight that should help.  Try to keep cool and hydrated.  You can use the triamcinolone  cream for extra relief.  You can also take cetirizine or Benadryl  at home as needed.  Follow-up with your primary care if worse or not improving.     ED Prescriptions     Medication Sig Dispense Auth. Provider   triamcinolone   cream (KENALOG ) 0.5 % Apply 1 Application topically 3 (three) times daily. Apply thin layer to affected areas no longer than 2 weeks consistently. 30 g Gearline Spilman M, PA-C      PDMP not reviewed this encounter.   AllwardtMardy HERO, PA-C 06/20/24 1851

## 2024-06-20 NOTE — ED Triage Notes (Signed)
 Patient here today with c/o itchy rash on bilat upper legs on tattoos, left arm and right flank X 1 week. Patient states that the rash on her arm started 2 days after getting a tattoo on her forearm. Patient has been using Cortizone cream with some relief.

## 2024-06-20 NOTE — Discharge Instructions (Addendum)
 Good to meet you today.  You appear to be having a reaction to something in the environment, whether poison oak allergens or tattoos or soaps, etc.  You were given a steroid injection at the urgent care tonight that should help.  Try to keep cool and hydrated.  You can use the triamcinolone  cream for extra relief.  You can also take cetirizine or Benadryl  at home as needed.  Follow-up with your primary care if worse or not improving.

## 2024-06-26 ENCOUNTER — Emergency Department (HOSPITAL_COMMUNITY)

## 2024-06-26 ENCOUNTER — Encounter (HOSPITAL_COMMUNITY): Payer: Self-pay | Admitting: *Deleted

## 2024-06-26 ENCOUNTER — Emergency Department (HOSPITAL_COMMUNITY)
Admission: EM | Admit: 2024-06-26 | Discharge: 2024-06-26 | Disposition: A | Discharge status: Home / Self Care | Attending: Emergency Medicine | Admitting: Emergency Medicine

## 2024-06-26 ENCOUNTER — Other Ambulatory Visit: Payer: Self-pay

## 2024-06-26 DIAGNOSIS — R0789 Other chest pain: Secondary | ICD-10-CM | POA: Diagnosis not present

## 2024-06-26 DIAGNOSIS — J449 Chronic obstructive pulmonary disease, unspecified: Secondary | ICD-10-CM | POA: Diagnosis not present

## 2024-06-26 DIAGNOSIS — R1030 Lower abdominal pain, unspecified: Secondary | ICD-10-CM

## 2024-06-26 DIAGNOSIS — Z87891 Personal history of nicotine dependence: Secondary | ICD-10-CM | POA: Insufficient documentation

## 2024-06-26 DIAGNOSIS — Z91040 Latex allergy status: Secondary | ICD-10-CM | POA: Diagnosis not present

## 2024-06-26 DIAGNOSIS — K922 Gastrointestinal hemorrhage, unspecified: Secondary | ICD-10-CM | POA: Diagnosis not present

## 2024-06-26 DIAGNOSIS — K529 Noninfective gastroenteritis and colitis, unspecified: Secondary | ICD-10-CM

## 2024-06-26 LAB — CBC WITH DIFFERENTIAL/PLATELET
Abs Immature Granulocytes: 0.12 K/uL — ABNORMAL HIGH (ref 0.00–0.07)
Basophils Absolute: 0.1 K/uL (ref 0.0–0.1)
Basophils Relative: 1 %
Eosinophils Absolute: 0.3 K/uL (ref 0.0–0.5)
Eosinophils Relative: 3 %
HCT: 40 % (ref 36.0–46.0)
Hemoglobin: 13.7 g/dL (ref 12.0–15.0)
Immature Granulocytes: 1 %
Lymphocytes Relative: 19 %
Lymphs Abs: 2.2 K/uL (ref 0.7–4.0)
MCH: 31.6 pg (ref 26.0–34.0)
MCHC: 34.3 g/dL (ref 30.0–36.0)
MCV: 92.2 fL (ref 80.0–100.0)
Monocytes Absolute: 1 K/uL (ref 0.1–1.0)
Monocytes Relative: 8 %
Neutro Abs: 8 K/uL — ABNORMAL HIGH (ref 1.7–7.7)
Neutrophils Relative %: 68 %
Platelets: 339 K/uL (ref 150–400)
RBC: 4.34 MIL/uL (ref 3.87–5.11)
RDW: 12.2 % (ref 11.5–15.5)
WBC: 11.8 K/uL — ABNORMAL HIGH (ref 4.0–10.5)
nRBC: 0 % (ref 0.0–0.2)

## 2024-06-26 LAB — URINALYSIS, ROUTINE W REFLEX MICROSCOPIC
Bacteria, UA: NONE SEEN
Bilirubin Urine: NEGATIVE
Glucose, UA: NEGATIVE mg/dL
Ketones, ur: NEGATIVE mg/dL
Leukocytes,Ua: NEGATIVE
Nitrite: NEGATIVE
Protein, ur: NEGATIVE mg/dL
Specific Gravity, Urine: 1.004 — ABNORMAL LOW (ref 1.005–1.030)
pH: 6 (ref 5.0–8.0)

## 2024-06-26 LAB — COMPREHENSIVE METABOLIC PANEL WITH GFR
ALT: 19 U/L (ref 0–44)
AST: 15 U/L (ref 15–41)
Albumin: 3.2 g/dL — ABNORMAL LOW (ref 3.5–5.0)
Alkaline Phosphatase: 79 U/L (ref 38–126)
Anion gap: 11 (ref 5–15)
BUN: 12 mg/dL (ref 6–20)
CO2: 23 mmol/L (ref 22–32)
Calcium: 9.2 mg/dL (ref 8.9–10.3)
Chloride: 103 mmol/L (ref 98–111)
Creatinine, Ser: 0.95 mg/dL (ref 0.44–1.00)
GFR, Estimated: >60 mL/min (ref >60)
Glucose, Bld: 88 mg/dL (ref 70–99)
Potassium: 4.2 mmol/L (ref 3.5–5.1)
Sodium: 137 mmol/L (ref 135–145)
Total Bilirubin: 0.8 mg/dL (ref 0.0–1.2)
Total Protein: 6.4 g/dL — ABNORMAL LOW (ref 6.5–8.1)

## 2024-06-26 LAB — LIPASE, BLOOD: Lipase: 27 U/L (ref 11–51)

## 2024-06-26 LAB — TROPONIN I (HIGH SENSITIVITY)
Troponin I (High Sensitivity): 7 ng/L (ref <18)
Troponin I (High Sensitivity): 5 ng/L (ref <18)

## 2024-06-26 LAB — HCG, SERUM, QUALITATIVE: Preg, Serum: NEGATIVE

## 2024-06-26 LAB — TYPE AND SCREEN
ABO/RH(D): A POS
Antibody Screen: NEGATIVE

## 2024-06-26 LAB — POC OCCULT BLOOD, ED: Fecal Occult Bld: NEGATIVE

## 2024-06-26 MED ORDER — CIPROFLOXACIN HCL 500 MG PO TABS: 500.0000 mg | ORAL_TABLET | Freq: Two times a day (BID) | ORAL | 0 refills | Status: DC

## 2024-06-26 MED ORDER — IOHEXOL 350 MG/ML SOLN
75.0000 mL | Freq: Once | INTRAVENOUS | Status: AC | PRN
  Administered 2024-06-26: 75 mL via INTRAVENOUS

## 2024-06-26 NOTE — Discharge Instructions (Signed)
 Take the antibiotic Cipro  as directed.  Keep your appointment to follow-up with gastroenterology.  As we discussed return for any heavier bleeding.  If you stay in the toilet water all red x 2 or 3 times in a day or feeling like you are going to pass out you need to get seen and probably will require admission.

## 2024-06-26 NOTE — ED Provider Notes (Addendum)
 [place] EMERGENCY DEPARTMENT AT [facility] Provider Note   CSN: [id number] Arrival date & time: 06/26/24  1430     Patient presents with: No chief complaint on file.   Sara Booth is a 48 y.o. female.   Patient states that she has been having blood in her bowel movements now for several weeks.  Has not had much today.  But other days has had several.  She has been told in the past that is due to internal hemorrhoids.  Patient also with a complaint of lower abdominal pain and also some lower chest pain.  Patient states bowel movements are very loose.  Patient denies having any antibiotics recently.  She talks about having dental work done several weeks ago so we will clarify that.  Past medical history in for asthma pelvic adhesions irregular menstrual cycle bipolar disorder COPD.  Past surgical hist significant for cholecystectomy left salpingo-oophorectomy.  Patient former smoker.       Prior to Admission medications   Medication Sig Start Date End Date Taking? Authorizing Provider  albuterol  (PROVENTIL ) (2.5 MG/3ML) 0.083% nebulizer solution Take 3 mLs (2.5 mg total) by nebulization every 6 (six) hours as needed for wheezing or shortness of breath. 02/22/24   Raspet, Erin K, PA-C  albuterol  (VENTOLIN  HFA) 108 (90 Base) MCG/ACT inhaler Use 2 puffs every 6 hours as needed 09/18/23   Lucius Krabbe, NP  budesonide -glycopyrrolate -formoterol  (BREZTRI  AEROSPHERE) 160-9-4.8 MCG/ACT AERO inhaler Inhale 2 puffs into the lungs in the morning and at bedtime. 03/20/24   Lucius Krabbe, NP  dicyclomine  (BENTYL ) 20 MG tablet Take 20 mg by mouth every evening. 03/15/24   [provider]  drospirenone -ethinyl estradiol  (YAZ) 3-0.02 MG tablet Take 1 tablet by mouth daily. 04/15/24   Eveline Lynwood MATSU, MD  ibuprofen  (ADVIL ) 200 MG tablet Take 200-800 mg by mouth every 6 (six) hours as needed for moderate pain (pain score 4-6).    [provider]  ipratropium  (ATROVENT ) 0.03 % nasal spray Place 2 sprays into both nostrils daily. 03/20/24   Lucius Krabbe, NP  montelukast  (SINGULAIR ) 10 MG tablet Take 10 mg by mouth at bedtime.    [provider]  Multiple Vitamin (MULTIVITAMIN PO) Take 1 tablet by mouth daily.    [provider]  triamcinolone  cream (KENALOG ) 0.5 % Apply 1 Application topically 3 (three) times daily. Apply thin layer to affected areas no longer than 2 weeks consistently. 06/20/24   Allwardt, Alyssa M, PA-C    Allergies: Amoxicillin, Erythromycin, Penicillins, and Latex    Review of Systems  Constitutional:  Negative for chills and fever.  HENT:  Negative for ear pain and sore throat.   Eyes:  Negative for pain and visual disturbance.  Respiratory:  Negative for cough and shortness of breath.   Cardiovascular:  Positive for chest pain. Negative for palpitations.  Gastrointestinal:  Positive for abdominal pain, blood in stool and diarrhea. Negative for vomiting.  Genitourinary:  Negative for dysuria and hematuria.  Musculoskeletal:  Negative for arthralgias and back pain.  Skin:  Negative for color change and rash.  Neurological:  Negative for seizures and syncope.  All other systems reviewed and are negative.   Updated Vital Signs BP 136/73 (BP Location: Right Arm)   Pulse 79   Temp 98 F (36.7 C)   Resp 18   Ht 1.702 m (5' 7)   Wt 90.7 kg   LMP 06/08/2024 (Approximate)   SpO2 99%   BMI 31.32 kg/m  Physical Exam Vitals and nursing note reviewed.  Constitutional:      General: She is not in acute distress.    Appearance: Normal appearance. She is well-developed.  HENT:     Head: Normocephalic and atraumatic.  Eyes:     Extraocular Movements: Extraocular movements intact.     Conjunctiva/sclera: Conjunctivae normal.     Pupils: Pupils are equal, round, and reactive to light.  Cardiovascular:     Rate and Rhythm: Normal rate and regular rhythm.     Heart sounds: No murmur  heard. Pulmonary:     Effort: Pulmonary effort is normal. No respiratory distress.     Breath sounds: Normal breath sounds. No wheezing or rales.  Abdominal:     General: There is no distension.     Palpations: Abdomen is soft.     Tenderness: There is no abdominal tenderness. There is no guarding.  Genitourinary:    Rectum: Guaiac result positive.  Musculoskeletal:        General: No swelling.     Cervical back: Neck supple.  Skin:    General: Skin is warm and dry.     Capillary Refill: Capillary refill takes less than 2 seconds.  Neurological:     General: No focal deficit present.     Mental Status: She is alert and oriented to person, place, and time.  Psychiatric:        Mood and Affect: Mood normal.     (all labs ordered are listed, but only abnormal results are displayed) Labs Reviewed  COMPREHENSIVE METABOLIC PANEL WITH GFR - Abnormal; Notable for the following components:      Result Value   Total Protein 6.4 (*)    Albumin 3.2 (*)    All other components within normal limits  CBC WITH DIFFERENTIAL/PLATELET - Abnormal; Notable for the following components:   WBC 11.8 (*)    Neutro Abs 8.0 (*)    Abs Immature Granulocytes 0.12 (*)    All other components within normal limits  URINALYSIS, ROUTINE W REFLEX MICROSCOPIC - Abnormal; Notable for the following components:   Color, Urine STRAW (*)    Specific Gravity, Urine 1.004 (*)    Hgb urine dipstick SMALL (*)    All other components within normal limits  LIPASE, BLOOD  HCG, SERUM, QUALITATIVE  POC OCCULT BLOOD, ED  TYPE AND SCREEN  TROPONIN I (HIGH SENSITIVITY)  TROPONIN I (HIGH SENSITIVITY)    EKG: EKG Interpretation Date/Time:  Friday June 26 2024 17:24:29 EDT Ventricular Rate:  69 PR Interval:  188 QRS Duration:  98 QT Interval:  404 QTC Calculation: 433 R Axis:   63  Text Interpretation: Sinus rhythm No previous ECGs available Confirmed by Erich Kochan [phone]) on 06/26/2024 5:29:34  PM  Radiology: No results found.   Procedures   Medications Ordered in the ED - No data to display                                  Medical Decision Making Amount and/or Complexity of Data Reviewed Labs: ordered. Radiology: ordered.  Risk Prescription drug management.   Will get's CT abdomen and pelvis due to the diarrhea and the bloody bowel movements.  Also due to the chest pain will get chest x-ray and troponins and EKG. EKG without any acute findings.  Patient blood type is a positive urinalysis negative complete metabolic panel normal.  Lipase normal 27 CBC as  stated above.  Awaiting CT abdomen pelvis.  Pregnancy test was negative as well.  Chest x-ray is pending CT scan abdomen pelvis is pending troponins pending.  Initial troponin is 7.  CT scan of abdomen and pelvis mild segmental wall thickening of the descending and sigmoid colon could be secondary to underdistention or nonspecific colitis.  Patient not febrile here will white count up slightly.  Will confirm whether she been on antibiotics recently.  Will treat her with antibiotics.  Patient does have follow-up with gastroenterology.  Patient's rectal exam no external hemorrhoids actually no stool in the vault.  No gross blood.  Patient hemodynamically very stable.  Think patient can be discharged home with precautions.  Will treat her with Cipro  for the possible colitis.  Patient was on antibiotics for tooth infection a month ago.  It is possible that could have caused the problem.     Final diagnoses:  Lower abdominal pain  Atypical chest pain  Gastrointestinal hemorrhage, unspecified gastrointestinal hemorrhage type    ED Discharge Orders     None          Geraldene Hamilton, MD 06/26/24 1744    Geraldene Hamilton, MD 06/26/24 8145    Geraldene Hamilton, MD 06/26/24 7976    Geraldene Hamilton, MD 06/26/24 2033

## 2024-06-26 NOTE — ED Triage Notes (Signed)
 States she had dental work done several weeks ago , c/o diarrhea since , states she goes to the bathroom multiple times a day, states she had a GI appointment on the 09/5

## 2024-06-26 NOTE — ED Notes (Signed)
 Pt taken to CT at this time.

## 2024-06-26 NOTE — ED Notes (Signed)
 Pt discharged. Pt given discharge papers and papers explained. Pt in [place] at this time

## 2024-07-13 ENCOUNTER — Telehealth: Payer: Self-pay | Admitting: Family

## 2024-07-13 NOTE — Progress Notes (Deleted)
    Subjective:    Patient ID: Sara Booth, female    DOB: 09-24-1976, 48 y.o.   MRN: [id number]      HPI Sara Booth is here for No chief complaint on file.        Medications and allergies reviewed with patient and updated if appropriate.  Current Outpatient Medications on File Prior to Visit  Medication Sig Dispense Refill   albuterol  (PROVENTIL ) (2.5 MG/3ML) 0.083% nebulizer solution Take 3 mLs (2.5 mg total) by nebulization every 6 (six) hours as needed for wheezing or shortness of breath. 75 mL 0   albuterol  (VENTOLIN  HFA) 108 (90 Base) MCG/ACT inhaler Use 2 puffs every 6 hours as needed 3 each 3   budesonide -glycopyrrolate -formoterol  (BREZTRI  AEROSPHERE) 160-9-4.8 MCG/ACT AERO inhaler Inhale 2 puffs into the lungs in the morning and at bedtime. 32.1 g 3   ciprofloxacin  (CIPRO ) 500 MG tablet Take 1 tablet (500 mg total) by mouth every 12 (twelve) hours. 10 tablet 0   dicyclomine  (BENTYL ) 20 MG tablet Take 20 mg by mouth every evening.     drospirenone -ethinyl estradiol  (YAZ) 3-0.02 MG tablet Take 1 tablet by mouth daily. 28 tablet 11   ibuprofen  (ADVIL ) 200 MG tablet Take 200-800 mg by mouth every 6 (six) hours as needed for moderate pain (pain score 4-6).     ipratropium (ATROVENT ) 0.03 % nasal spray Place 2 sprays into both nostrils daily. 30 mL 2   montelukast  (SINGULAIR ) 10 MG tablet Take 10 mg by mouth at bedtime.     Multiple Vitamin (MULTIVITAMIN PO) Take 1 tablet by mouth daily.     triamcinolone  cream (KENALOG ) 0.5 % Apply 1 Application topically 3 (three) times daily. Apply thin layer to affected areas no longer than 2 weeks consistently. 30 g 0   No current facility-administered medications on file prior to visit.    Review of Systems     Objective:  There were no vitals filed for this visit. BP Readings from Last 3 Encounters:  06/26/24 120/83  06/20/24 117/73  04/15/24 113/75   Wt Readings from Last 3 Encounters:  06/26/24 200 lb (90.7 kg)  04/15/24 199 lb  (90.3 kg)  03/20/24 200 lb 6 oz (90.9 kg)   There is no height or weight on file to calculate BMI.    Physical Exam         Assessment & Plan:    See Problem List for Assessment and Plan of chronic medical problems.

## 2024-07-13 NOTE — Telephone Encounter (Signed)
 Reviewed

## 2024-07-13 NOTE — Telephone Encounter (Signed)
 FYI    Copied from CRM [id number]. Topic: General - Other >> Jul 13, 2024  4:11 PM Corin V wrote: Reason for CRM: Patient will be bringing in new FMLA paperwork due to the healht issues still being ongoing and needing to get additional time off work until the GI issues are resolved.

## 2024-07-14 ENCOUNTER — Ambulatory Visit: Admitting: Internal Medicine

## 2024-07-14 NOTE — Progress Notes (Unsigned)
    Subjective:    Patient ID: Sara Booth, female    DOB: [birth year], 48 y.o.   MRN: [id number]      HPI Tracia is here for No chief complaint on file.        Medications and allergies reviewed with patient and updated if appropriate.  Current Outpatient Medications on File Prior to Visit  Medication Sig Dispense Refill   albuterol  (PROVENTIL ) (2.5 MG/3ML) 0.083% nebulizer solution Take 3 mLs (2.5 mg total) by nebulization every 6 (six) hours as needed for wheezing or shortness of breath. 75 mL 0   albuterol  (VENTOLIN  HFA) 108 (90 Base) MCG/ACT inhaler Use 2 puffs every 6 hours as needed 3 each 3   budesonide -glycopyrrolate -formoterol  (BREZTRI  AEROSPHERE) 160-9-4.8 MCG/ACT AERO inhaler Inhale 2 puffs into the lungs in the morning and at bedtime. 32.1 g 3   ciprofloxacin  (CIPRO ) 500 MG tablet Take 1 tablet (500 mg total) by mouth every 12 (twelve) hours. 10 tablet 0   dicyclomine  (BENTYL ) 20 MG tablet Take 20 mg by mouth every evening.     drospirenone -ethinyl estradiol  (YAZ) 3-0.02 MG tablet Take 1 tablet by mouth daily. 28 tablet 11   ibuprofen  (ADVIL ) 200 MG tablet Take 200-800 mg by mouth every 6 (six) hours as needed for moderate pain (pain score 4-6).     ipratropium (ATROVENT ) 0.03 % nasal spray Place 2 sprays into both nostrils daily. 30 mL 2   montelukast  (SINGULAIR ) 10 MG tablet Take 10 mg by mouth at bedtime.     Multiple Vitamin (MULTIVITAMIN PO) Take 1 tablet by mouth daily.     triamcinolone  cream (KENALOG ) 0.5 % Apply 1 Application topically 3 (three) times daily. Apply thin layer to affected areas no longer than 2 weeks consistently. 30 g 0   No current facility-administered medications on file prior to visit.    Review of Systems     Objective:  There were no vitals filed for this visit. BP Readings from Last 3 Encounters:  06/26/24 120/83  06/20/24 117/73  04/15/24 113/75   Wt Readings from Last 3 Encounters:  06/26/24 200 lb (90.7 kg)  04/15/24 199 lb  (90.3 kg)  03/20/24 200 lb 6 oz (90.9 kg)   There is no height or weight on file to calculate BMI.    Physical Exam         Assessment & Plan:    See Problem List for Assessment and Plan of chronic medical problems.

## 2024-07-15 ENCOUNTER — Encounter: Payer: Self-pay | Admitting: Internal Medicine

## 2024-07-15 ENCOUNTER — Ambulatory Visit (INDEPENDENT_AMBULATORY_CARE_PROVIDER_SITE_OTHER): Admitting: Internal Medicine

## 2024-07-15 VITALS — BP 114/70 | HR 76 | Temp 98.4°F | Ht 67.0 in | Wt 206.0 lb

## 2024-07-15 DIAGNOSIS — R21 Rash and other nonspecific skin eruption: Secondary | ICD-10-CM

## 2024-07-15 MED ORDER — PREDNISONE 10 MG PO TABS: ORAL_TABLET | ORAL | 0 refills | Status: DC

## 2024-07-15 MED ORDER — METHYLPREDNISOLONE ACETATE 80 MG/ML IJ SUSP
80.0000 mg | Freq: Once | INTRAMUSCULAR | Status: AC
  Administered 2024-07-15: 80 mg via INTRAMUSCULAR

## 2024-07-15 NOTE — Patient Instructions (Addendum)
     You received a steroid injection.      Medications changes include :   start prednisone  tomorrow with breakfast       Return if symptoms worsen or fail to improve.

## 2024-07-16 NOTE — Telephone Encounter (Signed)
 Patient dropped off document FMLA, to be filled out by provider. Patient requested to send it back via Call Patient to pick up within ASAP. Document is located in providers tray at front office.Please advise at Mobile [phone] (mobile)

## 2024-07-17 NOTE — Telephone Encounter (Signed)
 Received. I called pt to go over information, Forms placed on providers desk.

## 2024-07-23 ENCOUNTER — Ambulatory Visit (INDEPENDENT_AMBULATORY_CARE_PROVIDER_SITE_OTHER): Admitting: Family

## 2024-07-23 ENCOUNTER — Encounter: Payer: Self-pay | Admitting: Family

## 2024-07-23 VITALS — BP 130/81 | HR 65 | Temp 97.9°F | Ht 67.0 in | Wt 202.4 lb

## 2024-07-23 DIAGNOSIS — K580 Irritable bowel syndrome with diarrhea: Secondary | ICD-10-CM

## 2024-07-23 DIAGNOSIS — F17200 Nicotine dependence, unspecified, uncomplicated: Secondary | ICD-10-CM

## 2024-07-23 DIAGNOSIS — E669 Obesity, unspecified: Secondary | ICD-10-CM

## 2024-07-23 DIAGNOSIS — J438 Other emphysema: Secondary | ICD-10-CM | POA: Diagnosis not present

## 2024-07-23 MED ORDER — BREZTRI AEROSPHERE 160-9-4.8 MCG/ACT IN AERO: 2.0000 | INHALATION_SPRAY | Freq: Two times a day (BID) | RESPIRATORY_TRACT | 3 refills | Status: AC

## 2024-07-23 NOTE — Progress Notes (Signed)
 Patient ID: Sara Booth, female    DOB: 1976/06/12, 48 y.o.   MRN: [id number]  Chief Complaint  Patient presents with   Diarrhea    Pt c/o diarrhea.    Rash    Pt c/o rash all over body, Present for a couple of weeks. Has tried prednisone , dove antibacterial and Cetaphil lotion.   Discussed the use of AI scribe software for clinical note transcription with the patient, who gave verbal consent to proceed.  History of Present Illness   Sara Booth is a 48 year old female who presents with a persistent rash and gastrointestinal symptoms.  Dermatologic symptoms - Persistent pruritic rash initially appearing on arms and legs, particularly around tattoos, with subsequent spread to non-tattooed areas - Rash has improved with ongoing prednisone  therapy (currently taking two prednisone  pills daily)  Gastrointestinal symptoms - Diarrhea and abdominal churning - Calprotectin level elevated at 1910 - Colonoscopy and endoscopy performed, with no UC or crohns found - No current medications for acid reflux or E. coli infection - Dicyclomine  no longer effective for cramping or diarrhea - Imodium ineffective for diarrhea - No antibiotic therapy for E. coli - Previous prednisone  injection improved bowel movements - Requesting new FMLA paperwork completed  Constitutional symptoms - Persistent fatigue, attributed to gastrointestinal symptoms - Weight gain, current weight 200 pounds - Considering weight loss options  COPD & Tobacco use - Quit smoking and vaping, but occasionally smokes a few cigarettes per day, which induces nausea - No use of albuterol  in the past six months - Uses Breztri  twice daily for breathing     Assessment and Plan    Chronic diarrhea/irritable bowel syndrome Not controlled by dicyclomine . Dicyclomine  helped with cramping some. Imodium ineffective. Per review of EMR, referred to [facility] in August, but she never returned calls and referral dropped, has seen [facility]. - Reviewed [facility] notes and pt had elevated calprotectin which sometimes is due to IBD or E.Coli, but neither found with testing. - Will send trial of Hyoscyamine  0.125mg  q4h prn -  Refilling Bentyl  to take prn if Hyoscyamine  is not working - Consider new GI referral. - Advise dietary modifications to avoid fiber-rich foods. - FMLA paperwork completed and provided to patient, copy scanned to EMR.  Chronic obstructive pulmonary disease (COPD) COPD with improved symptoms after smoking reduction. No albuterol  use in six months. - Continue Breztri  twice daily, refill sent. - F/U in 6 mos  Nicotine dependence, current smoker Current smoker, relapsed to 2-3 cigarettes/day. Aware of health impact, attempting to quit. - Encourage continued smoking cessation efforts.  Weight management Discussed weight management and medication options. Concerns about side effects and insurance coverage. - Check insurance coverage for weight loss medications, provided handout on one known [employer] plan she can ask about, [employer] [facility]. - Schedule appointment to discuss options if covered.      Subjective:    Outpatient Medications Prior to Visit  Medication Sig Dispense Refill   albuterol  (PROVENTIL ) (2.5 MG/3ML) 0.083% nebulizer solution Take 3 mLs (2.5 mg total) by nebulization every 6 (six) hours as needed for wheezing or shortness of breath. 75 mL 0   albuterol  (VENTOLIN  HFA) 108 (90 Base) MCG/ACT inhaler Use 2 puffs every 6 hours as needed 3 each 3   budesonide -glycopyrrolate -formoterol  (BREZTRI  AEROSPHERE) 160-9-4.8 MCG/ACT AERO inhaler Inhale 2 puffs into the lungs in the morning and at bedtime. 32.1 g 3   dicyclomine  (BENTYL ) 20 MG tablet Take 20 mg by mouth every  evening.     drospirenone -ethinyl estradiol  (YAZ) 3-0.02 MG tablet Take 1 tablet by mouth daily. 28 tablet 11   ibuprofen  (ADVIL ) 200 MG tablet Take 200-800 mg by mouth every 6 (six) hours as needed for moderate pain  (pain score 4-6).     ipratropium (ATROVENT ) 0.03 % nasal spray Place 2 sprays into both nostrils daily. 30 mL 2   montelukast  (SINGULAIR ) 10 MG tablet Take 10 mg by mouth at bedtime.     Multiple Vitamin (MULTIVITAMIN PO) Take 1 tablet by mouth daily.     predniSONE  (DELTASONE ) 10 MG tablet Take 4 tabs po qd x 3 days, then 3 tabs po qd x 3 days, then 2 tabs po qd x 3 days, then 1 tab po qd x 3 days 30 tablet 0   triamcinolone  cream (KENALOG ) 0.5 % Apply 1 Application topically 3 (three) times daily. Apply thin layer to affected areas no longer than 2 weeks consistently. 30 g 0   No facility-administered medications prior to visit.   Past Medical History:  Diagnosis Date   Abscess, umbilical 07/22/2015   Amenorrhea 03/23/2013   Anemia    Anxiety    Asthma    Bipolar disorder (HCC) 03/03/2012   Callus of foot 01/12/2011   Patient continues to trim.  Don't think these are plantar warts.  Patient has moleskin cushion which she would to try to cushion her feet.    She doesn't want to try anything else for this at this time.  Discussed possible podiatry referral, but patient moving to [place]  in 4 months and states that she would like to hold off.    Will follow up in 2-3 months with her.     COPD (chronic obstructive pulmonary disease) (HCC)    Dyspnea    IBS 07/09/2008   Qualifier: Diagnosis of   By: Alla  MD, Alda       Irregular menstrual cycle 09/19/2007   Qualifier: Diagnosis of   By: Alla  MD, Kanhka       OBESITY, NOS 01/02/2007   Qualifier: Diagnosis of   By: Manford Longs       Pelvic adhesions    Post op infection 07/15/2015   Sinus infection 12/20/2011   Subcutaneous abscess 07/14/2015   TOBACCO DEPENDENCE 01/02/2007   Qualifier: Diagnosis of   By: Manford Longs       Vaginal Pap smear, abnormal    Past Surgical History:  Procedure Laterality Date   CHOLECYSTECTOMY     INCISION AND DRAINAGE ABSCESS N/A 07/14/2015   Procedure: INCISION AND DRAINAGE UMBILICAL  ABSCESS;  Surgeon: Burnard VEAR Pate, MD;  Location: [facility] OR;  Service: Gynecology;  Laterality: N/A;   LAPAROSCOPIC UNILATERAL SALPINGO OOPHERECTOMY Left 06/28/2015   Procedure: LAPAROSCOPIC LEFT SALPINGO OOPHORECTOMY;  Surgeon: Winton Felt, MD;  Location: [facility] ORS;  Service: Gynecology;  Laterality: Left;   TUBAL LIGATION     Allergies  Allergen Reactions   Amoxicillin Other (See Comments)    Made her very hot and her lips turned blue. She also had frequent urination.   Erythromycin     Was told not to take it in the hospital   Penicillins Other (See Comments)    She can take any cillins    Correction  Pt can  Not take  cillens per pt   Latex Rash      Objective:    Physical Exam Vitals and nursing note reviewed.  Constitutional:      Appearance: Normal appearance.  Cardiovascular:     Rate and Rhythm: Normal rate and regular rhythm.  Pulmonary:     Effort: Pulmonary effort is normal.     Breath sounds: Normal breath sounds.  Musculoskeletal:        General: Normal range of motion.  Skin:    General: Skin is warm and dry.  Neurological:     Mental Status: She is alert.  Psychiatric:        Mood and Affect: Mood normal.        Behavior: Behavior normal.    BP 130/81 (BP Location: Left Arm, Patient Position: Sitting, Cuff Size: Large)   Pulse 65   Temp 97.9 F (36.6 C) (Temporal)   Ht 5' 7 (1.702 m)   Wt 202 lb 6.4 oz (91.8 kg)   LMP  (LMP Unknown)   SpO2 95%   BMI 31.70 kg/m  Wt Readings from Last 3 Encounters:  07/23/24 202 lb 6.4 oz (91.8 kg)  07/15/24 206 lb (93.4 kg)  06/26/24 200 lb (90.7 kg)      Lucius Krabbe, NP

## 2024-07-26 MED ORDER — HYOSCYAMINE SULFATE 0.125 MG PO TABS: 0.1250 mg | ORAL_TABLET | ORAL | 2 refills | Status: AC | PRN

## 2024-07-26 MED ORDER — DICYCLOMINE HCL 20 MG PO TABS: 20.0000 mg | ORAL_TABLET | Freq: Two times a day (BID) | ORAL | 5 refills | Status: AC | PRN

## 2024-07-26 NOTE — Assessment & Plan Note (Signed)
 Not controlled by dicyclomine . Dicyclomine  helped with cramping some. Imodium ineffective. Per review of EMR, referred to [facility] in August, but she never returned calls and referral dropped, has seen [facility]. - Reviewed [facility] notes and pt had elevated calprotectin which sometimes is due to IBD or E.Coli, but neither found with testing. - Will send trial of Hyoscyamine  0.125mg  q4h prn -  Refilling Bentyl  to take prn if Hyoscyamine  is not working - Consider new GI referral. - Advise dietary modifications to avoid fiber-rich foods. - FMLA paperwork completed and provided to patient, copy scanned to EMR.

## 2024-07-27 ENCOUNTER — Telehealth: Payer: Self-pay

## 2024-07-27 NOTE — Telephone Encounter (Signed)
 I called pt, pt verbalized understanding to PCP's recommendations. Pt states she has been having SOB when her stomach starts cramping. Pt states she has been smoking more often and would like to know if this could be related. Please advise.

## 2024-07-27 NOTE — Telephone Encounter (Signed)
-----   Message from Lucius Krabbe sent at 07/26/2024  9:42 PM EDT ----- Regarding: call patient Please call Sara Booth and let her know I sent in a new medicine to try for her diarrhea and cramping, it is given for patients with inflammatory bowel - called Hyoscyamine . She can take up to 4 times per day as needed. I also refilled the Bentyl , but since this was not working as well anymore I want her to try the new med first.

## 2024-07-28 NOTE — Telephone Encounter (Signed)
 yes, has to quit smoking, thanks.

## 2024-07-28 NOTE — Telephone Encounter (Signed)
 I called pt and left a voicemail in regards to PCP's recommendations.

## 2024-08-30 ENCOUNTER — Ambulatory Visit (HOSPITAL_COMMUNITY)
Admission: EM | Admit: 2024-08-30 | Discharge: 2024-08-30 | Disposition: A | Discharge status: Home / Self Care | Attending: Nurse Practitioner | Admitting: Nurse Practitioner

## 2024-08-30 ENCOUNTER — Encounter (HOSPITAL_COMMUNITY): Payer: Self-pay | Admitting: *Deleted

## 2024-08-30 ENCOUNTER — Other Ambulatory Visit: Payer: Self-pay

## 2024-08-30 DIAGNOSIS — L299 Pruritus, unspecified: Secondary | ICD-10-CM

## 2024-08-30 DIAGNOSIS — R21 Rash and other nonspecific skin eruption: Secondary | ICD-10-CM | POA: Diagnosis not present

## 2024-08-30 MED ORDER — CETIRIZINE HCL 10 MG PO TABS
10.0000 mg | ORAL_TABLET | Freq: Every day | ORAL | 0 refills | Status: AC
Start: 2024-08-30 — End: ?

## 2024-08-30 MED ORDER — TRIAMCINOLONE ACETONIDE 0.5 % EX OINT: 1.0000 | TOPICAL_OINTMENT | Freq: Two times a day (BID) | CUTANEOUS | 0 refills | Status: AC

## 2024-08-30 MED ORDER — PREDNISONE 10 MG (21) PO TBPK: ORAL_TABLET | Freq: Every day | ORAL | 0 refills | Status: AC

## 2024-08-30 NOTE — ED Triage Notes (Signed)
 PT reports having a rash for well over a week  that started on her legs and is now over her body. Pt reports itching to rash areas. Pt tried OTC  and has been prescribed meds. Pt has taken Prednisone  multiple times and the itching and rash has not gone away.SABRA

## 2024-08-30 NOTE — Discharge Instructions (Addendum)
 You are being treated today for a chronic itchy rash that has been going on for over a month. The exact cause is not fully clear yet, and could be related to an allergic reaction, skin sensitivity, or an autoimmune condition. Your gastrointestinal issues have resolved, which is reassuring. You will be started on another short course of oral steroids to help reduce inflammation and itching. Please take this medication exactly as directed and do not skip doses. Use the triamcinolone  ointment on the affected areas twice a day for up to two weeks. Do not apply to the face, groin, or open skin. You were also prescribed an allergy medication to take once a day. You may also take Benadryl  at night if itching becomes severe, but be aware that it may cause drowsiness. For symptom relief at home, try cool showers, avoid scratching, and avoid scented soaps, lotions, or new detergents. Wearing loose clothing may also help reduce irritation. If you notice any new products, foods, or exposures that seem to make the rash worse, stop using them and make note of when it happens. You were provided with contact information for [facility] so you can schedule a follow-up appointment. They can perform more specific testing to help identify the cause of your symptoms. Seek prompt medical attention if the rash spreads rapidly, becomes painful, develops blistering or drainage, or if you develop fever, chills, or feel generally unwell. Go to the emergency room if you experience trouble breathing, swelling of the lips/tongue/throat, dizziness, or a severe allergic reaction. If you have questions or symptoms do not improve, please follow up with your primary care doctor.

## 2024-08-30 NOTE — ED Provider Notes (Signed)
 [facility]    CSN: [id number] Arrival date & time: 08/30/24  1002      History   Chief Complaint Chief Complaint  Patient presents with   Rash    HPI Sara Booth is a 48 y.o. female.   Discussed the use of AI scribe software for clinical note transcription with the patient, who gave verbal consent to proceed.   Patient presents with a persistent, pruritic rash that has been present for over one month and continues to spread without improvement. The rash initially appeared on the right lower leg and has progressively spread to bilateral thighs, right arm, chest, back, and stomach. She reports that the rash causes areas of her older tattoos to feel raised and irritated, while newer tattoos are not affected. She denies any new soaps, lotions, detergents, or recent environmental exposures and notes that she works from home. She did start a new birth control pill shortly before the onset of the rash.  She has undergone multiple prior treatments including a steroid injection and topical hydrocortisone on August 16, which did not improve symptoms. She later saw her primary care provider and was prescribed a low-dose prednisone  course on September 10 along with another steroid injection, also without significant relief. Previous clinicians mentioned possible autoimmune or allergic etiologies, possibly related to prior GI symptoms, though the patient reports that her GI issues have since resolved.  She has been advised to take daily antihistamines such as Zyrtec or Claritin  and was instructed previously to avoid Benadryl . She also uses ipratropium nasal spray for allergies. She has not tried any oral allergy medications. No systemic symptoms reported. Patient seeks further evaluation and symptom relief today.  The following sections of the patient's history were reviewed and updated as appropriate: allergies, current medications, past family history, past medical history, past social  history, past surgical history, and problem list.     Past Medical History:  Diagnosis Date   Abscess, umbilical 07/22/2015   Amenorrhea 03/23/2013   Anemia    Anxiety    Asthma    Bipolar disorder (HCC) 03/03/2012   Callus of foot 01/12/2011   Patient continues to trim.  Don't think these are plantar warts.  Patient has moleskin cushion which she would to try to cushion her feet.    She doesn't want to try anything else for this at this time.  Discussed possible podiatry referral, but patient moving to [place]  in 4 months and states that she would like to hold off.    Will follow up in 2-3 months with her.     COPD (chronic obstructive pulmonary disease) (HCC)    Dyspnea    IBS 07/09/2008   Qualifier: Diagnosis of   By: Alla  MD, Alda       Irregular menstrual cycle 09/19/2007   Qualifier: Diagnosis of   By: Alla  MD, Kanhka       OBESITY, NOS 01/02/2007   Qualifier: Diagnosis of   By: Manford Longs       Pelvic adhesions    Post op infection 07/15/2015   Sinus infection 12/20/2011   Subcutaneous abscess 07/14/2015   TOBACCO DEPENDENCE 01/02/2007   Qualifier: Diagnosis of   By: Manford Longs       Vaginal Pap smear, abnormal     Patient Active Problem List   Diagnosis Date Noted   Right ovarian cyst 03/20/2024   Other fatigue 07/01/2023   Pulmonary emphysema (HCC) 03/14/2023   Vaping nicotine dependence, non-tobacco product  03/14/2023   Rosacea 09/06/2015   Dermoid cyst of left ovary    PAP SMER CERV W/HI GRADE SQUAMOUS INTRAEPITH LES 10/19/2010   Irritable bowel syndrome with diarrhea 07/09/2008   Allergic rhinitis 01/02/2007    Past Surgical History:  Procedure Laterality Date   CHOLECYSTECTOMY     INCISION AND DRAINAGE ABSCESS N/A 07/14/2015   Procedure: INCISION AND DRAINAGE UMBILICAL ABSCESS;  Surgeon: Burnard VEAR Pate, MD;  Location: [facility];  Service: Gynecology;  Laterality: N/A;   LAPAROSCOPIC UNILATERAL SALPINGO OOPHERECTOMY Left 06/28/2015    Procedure: LAPAROSCOPIC LEFT SALPINGO OOPHORECTOMY;  Surgeon: Winton Felt, MD;  Location: [facility];  Service: Gynecology;  Laterality: Left;   TUBAL LIGATION      OB History     Gravida  5   Para  4   Term  4   Preterm      AB  1   Living  4      SAB  1   IAB      Ectopic      Multiple      Live Births               Home Medications    Prior to Admission medications   Medication Sig Start Date End Date Taking? Authorizing Provider  albuterol  (PROVENTIL ) (2.5 MG/3ML) 0.083% nebulizer solution Take 3 mLs (2.5 mg total) by nebulization every 6 (six) hours as needed for wheezing or shortness of breath. 02/22/24  Yes Raspet, Erin K, PA-C  budesonide -glycopyrrolate -formoterol  (BREZTRI  AEROSPHERE) 160-9-4.8 MCG/ACT AERO inhaler Inhale 2 puffs into the lungs in the morning and at bedtime. 07/23/24  Yes Lucius Krabbe, NP  cetirizine (ZYRTEC) 10 MG tablet Take 1 tablet (10 mg total) by mouth daily. 08/30/24  Yes Iola Lukes, FNP  drospirenone -ethinyl estradiol  (YAZ) 3-0.02 MG tablet Take 1 tablet by mouth daily. 04/15/24  Yes Eveline Lynwood MATSU, MD  hyoscyamine  (LEVSIN ) 0.125 MG tablet Take 1 tablet (0.125 mg total) by mouth every 4 (four) hours as needed (stomach cramping and diarrhea. Use first before Bentyl .). 07/26/24  Yes Hudnell, Krabbe, NP  ipratropium (ATROVENT ) 0.03 % nasal spray Place 2 sprays into both nostrils daily. 03/20/24  Yes Hudnell, Krabbe, NP  predniSONE  (STERAPRED UNI-PAK 21 TAB) 10 MG (21) TBPK tablet Take by mouth daily. Take 6 tabs by mouth daily  for 2 days, then 5 tabs for 2 days, then 4 tabs for 2 days, then 3 tabs for 2 days, 2 tabs for 2 days, then 1 tab by mouth daily for 2 days 08/30/24  Yes Iola Lukes, FNP  triamcinolone  ointment (KENALOG ) 0.5 % Apply 1 Application topically 2 (two) times daily. Apply thin layer to affected areas twice a day for up to 2 weeks 08/30/24  Yes Iola Lukes, FNP  albuterol  (VENTOLIN  HFA) 108 (90  Base) MCG/ACT inhaler Use 2 puffs every 6 hours as needed 09/18/23   Lucius Krabbe, NP  dicyclomine  (BENTYL ) 20 MG tablet Take 1 tablet (20 mg total) by mouth 2 (two) times daily as needed (For stomach cramping and/or diarrhea.). 07/26/24   Lucius Krabbe, NP    Family History Family History  Problem Relation Age of Onset   Depression Mother    Hypertension Father    Hyperlipidemia Father    Drug abuse Father    Diabetes Father    Alcohol abuse Father    Heart disease Father    Alcohol abuse Sister    Hypertension Brother    Hyperlipidemia Brother  Heart disease Brother    Learning disabilities Daughter    Depression Daughter    Alcohol abuse Daughter    Learning disabilities Son    Cancer Paternal Uncle    Cancer Maternal Grandmother        breast   Arthritis Maternal Grandmother    ADD / ADHD Maternal Grandmother    ADD / ADHD Paternal Grandfather     Social History Social History   Tobacco Use   Smoking status: Former    Current packs/day: 1.00    Types: Cigarettes   Smokeless tobacco: Current  Vaping Use   Vaping status: Some Days   Substances: Flavoring  Substance Use Topics   Alcohol use: Not Currently    Comment: rarely   Drug use: No    Comment: uses E-cigs without the nicotine     Allergies   Amoxicillin, Erythromycin, Penicillins, and Latex   Review of Systems Review of Systems  Constitutional:  Negative for fever.  Skin:  Positive for rash.  All other systems reviewed and are negative.    Physical Exam Triage Vital Signs ED Triage Vitals  Encounter Vitals Group     BP 08/30/24 1022 120/74     Girls Systolic BP Percentile --      Girls Diastolic BP Percentile --      Boys Systolic BP Percentile --      Boys Diastolic BP Percentile --      Pulse Rate 08/30/24 1022 83     Resp 08/30/24 1022 18     Temp 08/30/24 1022 98.3 F (36.8 C)     Temp src --      SpO2 08/30/24 1022 93 %     Weight --      Height --      Head  Circumference --      Peak Flow --      Pain Score 08/30/24 1018 2     Pain Loc --      Pain Education --      Exclude from Growth Chart --    No data found.  Updated Vital Signs BP 120/74   Pulse 83   Temp 98.3 F (36.8 C)   Resp 18   LMP 08/02/2024 (Approximate)   SpO2 93%   Visual Acuity Right Eye Distance:   Left Eye Distance:   Bilateral Distance:    Right Eye Near:   Left Eye Near:    Bilateral Near:     Physical Exam Vitals reviewed.  Constitutional:      General: She is awake. She is not in acute distress.    Appearance: Normal appearance. She is well-developed. She is not ill-appearing, toxic-appearing or diaphoretic.  HENT:     Head: Normocephalic.     Right Ear: Hearing normal.     Left Ear: Hearing normal.     Nose: Nose normal.     Mouth/Throat:     Mouth: Mucous membranes are moist.  Eyes:     General: Vision grossly intact.     Conjunctiva/sclera: Conjunctivae normal.  Cardiovascular:     Rate and Rhythm: Normal rate and regular rhythm.     Heart sounds: Normal heart sounds.  Pulmonary:     Effort: Pulmonary effort is normal.     Breath sounds: Normal breath sounds and air entry.  Musculoskeletal:        General: Normal range of motion.     Cervical back: Normal range of motion and neck supple.  Skin:    General: Skin is warm and dry.     Findings: Rash present.     Comments: Diffuse erythematous rash noted with scattered excoriations. No vesicles, pustules, open lesions, or drainage observed. No associated swelling, warmth, or induration. Rash is non-tender to palpation.  Neurological:     General: No focal deficit present.     Mental Status: She is alert and oriented to person, place, and time.  Psychiatric:        Speech: Speech normal.        Behavior: Behavior is cooperative.         UC Treatments / Results  Labs (all labs ordered are listed, but only abnormal results are displayed) Labs Reviewed - No data to  display  EKG   Radiology No results found.  Procedures Procedures (including critical care time)  Medications Ordered in UC Medications - No data to display  Initial Impression / Assessment and Plan / UC Course  I have reviewed the triage vital signs and the nursing notes.  Pertinent labs & imaging results that were available during my care of the patient were reviewed by me and considered in my medical decision making (see chart for details).     Patient presents with a chronic pruritic rash present for over one month, initially localized to the right lower leg and now spreading to bilateral thighs, right arm, chest, back, and stomach. Rash is noted to cause older tattoos to become raised and inflamed, while newer tattoos remain unaffected. Patient has previously been treated with two steroid injections, oral prednisone , and topical hydrocortisone with only temporary relief. The pattern and chronicity raise concern for a possible underlying autoimmune or allergic process, though other dermatologic etiologies remain in the differential. No current systemic symptoms and associated gastrointestinal complaints have resolved. Will initiate another short course of oral steroids for symptomatic management and prescribe triamcinolone  ointment to affected areas twice daily for up to two weeks. Patient also started on daily cetirizine and advised to use diphenhydramine  as needed for breakthrough itching. [facility] for [facility] including possible allergy or autoimmune testing. Patient agrees with plan and return precautions discussed.  Today's evaluation has revealed no signs of a dangerous process. Discussed diagnosis with patient and/or guardian. Patient and/or guardian aware of their diagnosis, possible red flag symptoms to watch out for and need for close follow up. Patient and/or guardian understands verbal and written discharge instructions.  Patient and/or guardian comfortable with plan and disposition.  Patient and/or guardian has a clear mental status at this time, good insight into illness (after discussion and teaching) and has clear judgment to make decisions regarding their care  Documentation was completed with the aid of voice recognition software. Transcription may contain typographical errors.   Final Clinical Impressions(s) / UC Diagnoses   Final diagnoses:  Rash and nonspecific skin eruption  Pruritus of skin     Discharge Instructions      You are being treated today for a chronic itchy rash that has been going on for over a month. The exact cause is not fully clear yet, and could be related to an allergic reaction, skin sensitivity, or an autoimmune condition. Your gastrointestinal issues have resolved, which is reassuring. You will be started on another short course of oral steroids to help reduce inflammation and itching. Please take this medication exactly as directed and do not skip doses. Use the triamcinolone  ointment on the affected areas twice a day  for up to two weeks. Do not apply to the face, groin, or open skin. You were also prescribed an allergy medication to take once a day. You may also take Benadryl  at night if itching becomes severe, but be aware that it may cause drowsiness. For symptom relief at home, try cool showers, avoid scratching, and avoid scented soaps, lotions, or new detergents. Wearing loose clothing may also help reduce irritation. If you notice any new products, foods, or exposures that seem to make the rash worse, stop using them and make note of when it happens. You were provided with contact information for the [facility] so you can schedule a follow-up appointment. They can perform more specific testing to help identify the cause of your symptoms. Seek prompt medical attention if the rash spreads rapidly, becomes painful, develops blistering or drainage, or if you develop  fever, chills, or feel generally unwell. Go to the emergency room if you experience trouble breathing, swelling of the lips/tongue/throat, dizziness, or a severe allergic reaction. If you have questions or symptoms do not improve, please follow up with your primary care doctor.      ED Prescriptions     Medication Sig Dispense Auth. Provider   cetirizine (ZYRTEC) 10 MG tablet Take 1 tablet (10 mg total) by mouth daily. 14 tablet Nereyda Bowler, [place], FNP   predniSONE  (STERAPRED UNI-PAK 21 TAB) 10 MG (21) TBPK tablet Take by mouth daily. Take 6 tabs by mouth daily  for 2 days, then 5 tabs for 2 days, then 4 tabs for 2 days, then 3 tabs for 2 days, 2 tabs for 2 days, then 1 tab by mouth daily for 2 days 42 tablet [facility], [place], FNP   triamcinolone  ointment (KENALOG ) 0.5 % Apply 1 Application topically 2 (two) times daily. Apply thin layer to affected areas twice a day for up to 2 weeks 30 g Iola Lukes, FNP      PDMP not reviewed this encounter.   Iola Lukes, [place] 08/30/24 1104

## 2024-09-19 ENCOUNTER — Other Ambulatory Visit: Payer: Self-pay | Admitting: Family

## 2024-09-19 DIAGNOSIS — J3089 Other allergic rhinitis: Secondary | ICD-10-CM

## 2024-10-31 ENCOUNTER — Emergency Department (HOSPITAL_COMMUNITY)

## 2024-10-31 ENCOUNTER — Emergency Department (HOSPITAL_COMMUNITY)
Admission: EM | Admit: 2024-10-31 | Discharge: 2024-10-31 | Disposition: A | Discharge status: Home / Self Care | Attending: Emergency Medicine | Admitting: Emergency Medicine

## 2024-10-31 DIAGNOSIS — N83201 Unspecified ovarian cyst, right side: Secondary | ICD-10-CM | POA: Diagnosis not present

## 2024-10-31 DIAGNOSIS — Z7951 Long term (current) use of inhaled steroids: Secondary | ICD-10-CM | POA: Diagnosis not present

## 2024-10-31 DIAGNOSIS — J449 Chronic obstructive pulmonary disease, unspecified: Secondary | ICD-10-CM | POA: Diagnosis not present

## 2024-10-31 DIAGNOSIS — E875 Hyperkalemia: Secondary | ICD-10-CM | POA: Diagnosis not present

## 2024-10-31 DIAGNOSIS — R1031 Right lower quadrant pain: Secondary | ICD-10-CM | POA: Diagnosis present

## 2024-10-31 DIAGNOSIS — R102 Pelvic and perineal pain unspecified side: Secondary | ICD-10-CM

## 2024-10-31 DIAGNOSIS — Z91040 Latex allergy status: Secondary | ICD-10-CM | POA: Diagnosis not present

## 2024-10-31 LAB — COMPREHENSIVE METABOLIC PANEL WITH GFR
ALT: 19 U/L (ref 0–44)
AST: 21 U/L (ref 15–41)
Albumin: 4.2 g/dL (ref 3.5–5.0)
Alkaline Phosphatase: 119 U/L (ref 38–126)
Anion gap: 11 (ref 5–15)
BUN: 11 mg/dL (ref 6–20)
CO2: 24 mmol/L (ref 22–32)
Calcium: 9.4 mg/dL (ref 8.9–10.3)
Chloride: 102 mmol/L (ref 98–111)
Creatinine, Ser: 0.69 mg/dL (ref 0.44–1.00)
GFR, Estimated: >60 mL/min
Glucose, Bld: 99 mg/dL (ref 70–99)
Potassium: 4 mmol/L (ref 3.5–5.1)
Sodium: 138 mmol/L (ref 135–145)
Total Bilirubin: 0.5 mg/dL (ref 0.0–1.2)
Total Protein: 7.1 g/dL (ref 6.5–8.1)

## 2024-10-31 LAB — CBC WITH DIFFERENTIAL/PLATELET
Abs Immature Granulocytes: 0.13 K/uL — ABNORMAL HIGH (ref 0.00–0.07)
Basophils Absolute: 0.1 K/uL (ref 0.0–0.1)
Basophils Relative: 1 %
Eosinophils Absolute: 0.2 K/uL (ref 0.0–0.5)
Eosinophils Relative: 2 %
HCT: 45.2 % (ref 36.0–46.0)
Hemoglobin: 15.2 g/dL — ABNORMAL HIGH (ref 12.0–15.0)
Immature Granulocytes: 1 %
Lymphocytes Relative: 14 %
Lymphs Abs: 1.9 K/uL (ref 0.7–4.0)
MCH: 30.6 pg (ref 26.0–34.0)
MCHC: 33.6 g/dL (ref 30.0–36.0)
MCV: 90.9 fL (ref 80.0–100.0)
Monocytes Absolute: 0.7 K/uL (ref 0.1–1.0)
Monocytes Relative: 5 %
Neutro Abs: 10.4 K/uL — ABNORMAL HIGH (ref 1.7–7.7)
Neutrophils Relative %: 77 %
Platelets: 348 K/uL (ref 150–400)
RBC: 4.97 MIL/uL (ref 3.87–5.11)
RDW: 12.8 % (ref 11.5–15.5)
WBC: 13.3 K/uL — ABNORMAL HIGH (ref 4.0–10.5)
nRBC: 0 % (ref 0.0–0.2)

## 2024-10-31 LAB — I-STAT CHEM 8, ED
BUN: 15 mg/dL (ref 6–20)
Calcium, Ion: 1.01 mmol/L — ABNORMAL LOW (ref 1.15–1.40)
Chloride: 104 mmol/L (ref 98–111)
Creatinine, Ser: 0.7 mg/dL (ref 0.44–1.00)
Glucose, Bld: 83 mg/dL (ref 70–99)
HCT: 44 % (ref 36.0–46.0)
Hemoglobin: 15 g/dL (ref 12.0–15.0)
Potassium: 6.8 mmol/L (ref 3.5–5.1)
Sodium: 135 mmol/L (ref 135–145)
TCO2: 25 mmol/L (ref 22–32)

## 2024-10-31 LAB — URINALYSIS, W/ REFLEX TO CULTURE (INFECTION SUSPECTED)
Bacteria, UA: NONE SEEN
Bilirubin Urine: NEGATIVE
Glucose, UA: NEGATIVE mg/dL
Hgb urine dipstick: NEGATIVE
Ketones, ur: NEGATIVE mg/dL
Leukocytes,Ua: NEGATIVE
Nitrite: NEGATIVE
Protein, ur: NEGATIVE mg/dL
Specific Gravity, Urine: 1.006 (ref 1.005–1.030)
pH: 6 (ref 5.0–8.0)

## 2024-10-31 LAB — LIPASE, BLOOD: Lipase: 31 U/L (ref 11–51)

## 2024-10-31 LAB — HCG, SERUM, QUALITATIVE: Preg, Serum: NEGATIVE

## 2024-10-31 MED ORDER — MORPHINE SULFATE (PF) 2 MG/ML IV SOLN
2.0000 mg | Freq: Once | INTRAVENOUS | Status: AC
  Administered 2024-10-31: 2 mg via INTRAVENOUS
  Filled 2024-10-31: qty 1

## 2024-10-31 MED ORDER — IOHEXOL 300 MG/ML  SOLN
100.0000 mL | Freq: Once | INTRAMUSCULAR | Status: AC | PRN
  Administered 2024-10-31: 100 mL via INTRAVENOUS

## 2024-10-31 MED ORDER — KETOROLAC TROMETHAMINE 15 MG/ML IJ SOLN
15.0000 mg | Freq: Once | INTRAMUSCULAR | Status: DC
  Filled 2024-10-31: qty 1

## 2024-10-31 NOTE — ED Provider Notes (Signed)
 " [facility] Provider Note   CSN: [id number] Arrival date & time: 10/31/24  9287     History Chief Complaint  Patient presents with   Ovarian Cyst    HPI: Sara Booth is a 48 y.o. female with history pertinent for ovarian cyst including dermoid cyst of the left ovary, elevated BMI, bipolar disorder, IBS, COPD not on home oxygen who presents complaining of pelvic pain. Patient arrived via POV.  History provided by patient.  No interpreter required during this encounter.  Patient reports that she has bilateral lower quadrant abdominal pain that started this morning sometime between 6 AM and 630.  Reports that she took ibuprofen  this morning because she works at [employer] and this usually causes foot pain, therefore she took prophylactic ibuprofen  before she went to work, however she still has significant pelvic pain despite ibuprofen .  Reports that it feels similar to when she previously has had swelling of her cysts.  Reports that this began acutely this morning.  Reports LMP was approximately 2 months ago, she has been off of her birth control which she is prescribed for management of her cysts, however, but has had difficulty getting this refilled due to her insurance.  Denies possibility of pregnancy, denies fever, chills, chest pain, shortness of breath, vomiting, diarrhea, endorses nausea.  Reports that she has not urinated since the pain at onset.  Patient's recorded medical, surgical, social, medication list and allergies were reviewed in the Snapshot window as part of the initial history.   Prior to Admission medications  Medication Sig Start Date End Date Taking? Authorizing Provider  albuterol  (PROVENTIL ) (2.5 MG/3ML) 0.083% nebulizer solution Take 3 mLs (2.5 mg total) by nebulization every 6 (six) hours as needed for wheezing or shortness of breath. 02/22/24   Raspet, Erin K, PA-C  albuterol  (VENTOLIN  HFA) 108 (90 Base) MCG/ACT inhaler  Use 2 puffs every 6 hours as needed 09/18/23   Lucius Krabbe, NP  budesonide -glycopyrrolate -formoterol  (BREZTRI  AEROSPHERE) 160-9-4.8 MCG/ACT AERO inhaler Inhale 2 puffs into the lungs in the morning and at bedtime. 07/23/24   Lucius Krabbe, NP  cetirizine  (ZYRTEC ) 10 MG tablet Take 1 tablet (10 mg total) by mouth daily. 08/30/24   Murrill, Samantha, FNP  dicyclomine  (BENTYL ) 20 MG tablet Take 1 tablet (20 mg total) by mouth 2 (two) times daily as needed (For stomach cramping and/or diarrhea.). 07/26/24   Lucius Krabbe, NP  drospirenone -ethinyl estradiol  (YAZ) 3-0.02 MG tablet Take 1 tablet by mouth daily. 04/15/24   Eveline Lynwood MATSU, MD  hyoscyamine  (LEVSIN ) 0.125 MG tablet Take 1 tablet (0.125 mg total) by mouth every 4 (four) hours as needed (stomach cramping and diarrhea. Use first before Bentyl .). 07/26/24   Lucius Krabbe, NP  ipratropium (ATROVENT ) 0.03 % nasal spray USE 2 SPRAYS IN BOTH NOSTRILS  DAILY 09/21/24   Hudnell, Krabbe, NP  predniSONE  (STERAPRED UNI-PAK 21 TAB) 10 MG (21) TBPK tablet Take by mouth daily. Take 6 tabs by mouth daily  for 2 days, then 5 tabs for 2 days, then 4 tabs for 2 days, then 3 tabs for 2 days, 2 tabs for 2 days, then 1 tab by mouth daily for 2 days 08/30/24   Iola Lukes, FNP  triamcinolone  ointment (KENALOG ) 0.5 % Apply 1 Application topically 2 (two) times daily. Apply thin layer to affected areas twice a day for up to 2 weeks 08/30/24   Iola Lukes, FNP     Allergies: Amoxicillin, Erythromycin, Penicillins, and  Latex   Review of Systems   ROS as per HPI  Physical Exam Updated Vital Signs BP 114/84   Pulse 71   Temp 98.3 F (36.8 C) (Oral)   Resp 16   Ht 5' 7 (1.702 m)   SpO2 95%   BMI 31.70 kg/m  Physical Exam Vitals and nursing note reviewed.  Constitutional:      General: She is not in acute distress.    Appearance: She is well-developed.  HENT:     Head: Normocephalic and atraumatic.  Eyes:      Conjunctiva/sclera: Conjunctivae normal.  Cardiovascular:     Rate and Rhythm: Normal rate and regular rhythm.     Heart sounds: No murmur heard. Pulmonary:     Effort: Pulmonary effort is normal. No respiratory distress.     Breath sounds: Normal breath sounds.  Abdominal:     Palpations: Abdomen is soft.     Tenderness: There is abdominal tenderness in the right lower quadrant, suprapubic area and left lower quadrant.  Musculoskeletal:        General: No swelling.     Cervical back: Neck supple.  Skin:    General: Skin is warm and dry.     Capillary Refill: Capillary refill takes less than 2 seconds.  Neurological:     Mental Status: She is alert.     Gait: Gait normal.  Psychiatric:        Mood and Affect: Mood normal.     ED Course/ Medical Decision Making/ A&P    Procedures Procedures   Medications Ordered in ED Medications  ketorolac  (TORADOL ) 15 MG/ML injection 15 mg (15 mg Intravenous Patient Refused/Not Given 10/31/24 1219)  morphine  (PF) 2 MG/ML injection 2 mg (2 mg Intravenous Given 10/31/24 0914)  iohexol  (OMNIPAQUE ) 300 MG/ML solution 100 mL (100 mLs Intravenous Contrast Given 10/31/24 1006)    Medical Decision Making:   Sara Booth is a 48 y.o. female who presents for abdominal pain as per above.  Physical exam is pertinent for soft abdomen with tenderness palpation bilateral lower quadrants as well as suprapubic area.   The differential includes but is not limited to ovarian cyst, ovarian torsion, appendicitis, cystitis, nephrolithiasis, pyelonephritis, bowel obstruction, pregnancy, ectopic pregnancy.  Independent historian: None  External data reviewed: No pertinent external data  Initial Plan:  Screening labs including CBC and Metabolic panel to evaluate for infectious or metabolic etiology of disease.  Screening hCG to evaluate for pregnancy Screening lipase for pancreatitis in the setting of abdominal pain Transvaginal ultrasound with Doppler to  evaluate for ovarian torsion/further evaluate pelvic mass Urinalysis with reflex culture ordered to evaluate for UTI or relevant urologic/nephrologic pathology.  CT abdomen pelvis to evaluate for structural/infectious intra-abdominal pathology.  Objective evaluation as below reviewed   Labs: Ordered, Independent interpretation, and Details: Lipase WNL.  hCG negative.  UA without UTI.  CMP without AKI, emergent electrolyte derangement, emergent LFT abnormality.  CBC with mild nonspecific leukocytosis and erythrocytosis.  No thrombocytopenia.  Radiology: Ordered, Independent interpretation, Details: Personally reviewed pelvic ultrasound, do not appreciate abnormal flow to the right ovary, I do not appreciate left ovary.  No gross abnormality of the uterus.  CT of the abdomen and pelvis without free air, significant free fluid, focal fat stranding, obstructive bowel gas pattern, and All images reviewed independently.  Agree with radiology report at this time.   CT ABDOMEN PELVIS W CONTRAST Result Date: 10/31/2024 CLINICAL DATA:  Lower quadrant abdominal pain. EXAM: CT ABDOMEN AND  PELVIS WITH CONTRAST TECHNIQUE: Multidetector CT imaging of the abdomen and pelvis was performed using the standard protocol following bolus administration of intravenous contrast. RADIATION DOSE REDUCTION: This exam was performed according to the departmental dose-optimization program which includes automated exposure control, adjustment of the mA and/or kV according to patient size and/or use of iterative reconstruction technique. CONTRAST:  OMNIPAQUE  IOHEXOL  300 MG/ML  SOLN COMPARISON:  06/26/2024 FINDINGS: Lower chest: Subtle changes of atelectasis in both lung bases. Hepatobiliary: No suspicious focal abnormality within the liver parenchyma. Small area of low attenuation in the anterior liver, adjacent to the falciform ligament, is in a characteristic location for focal fatty deposition/anomalous perfusion. Gallbladder is  surgically absent. Stable prominence of the common duct with common bile duct diameter 7 mm just proximal to the ampulla, not substantially changed from 6 mm previously. Pancreas: But No focal mass lesion. No dilatation of the main duct. No intraparenchymal cyst. No peripancreatic edema. Spleen: No splenomegaly. No suspicious focal mass lesion. Adrenals/Urinary Tract: No adrenal nodule or mass. Prominent right extrarenal pelvis with otherwise unremarkable right kidney. Similar 2.7 cm simple cyst in the upper pole left kidney. No followup imaging is recommended. No evidence for hydroureter. The urinary bladder appears normal for the degree of distention. Stomach/Bowel: Stomach is unremarkable. No gastric wall thickening. No evidence of outlet obstruction. Duodenum is normally positioned as is the ligament of Treitz. No small bowel wall thickening. No small bowel dilatation. The terminal ileum is normal. The appendix is normal. No gross colonic mass. No colonic wall thickening. Vascular/Lymphatic: No abdominal aortic aneurysm. No abdominal lymphadenopathy. No pelvic sidewall lymphadenopathy. Reproductive: Uterus unremarkable. 3.2 cm cystic lesion identified right ovary is noted ultrasound earlier today. Complex cystic lesion in the right ovary documented by ultrasound is not readily evident, likely obscured by complex small volume free fluid in the posterior right adnexal space and cul-de-sac suggesting blood products. Other: None. Musculoskeletal: No worrisome lytic or sclerotic osseous abnormality. IMPRESSION: 1. 4.1 cm complex cystic lesion in the right ovary documented by ultrasound earlier today is not readily evident, likely obscured by complex small volume free fluid in the posterior right adnexal space and cul-de-sac suggesting blood products. Taking both studies together, findings are most suggestive of a ruptured hemorrhagic cyst. Infectious debris could present as complex fluid in the cul-de-sac and  clinical correlation warranted. Follow-up ultrasound recommended to ensure resolution. 2. Otherwise no acute findings in the abdomen or pelvis. Electronically Signed   By: Camellia Candle M.D.   On: 10/31/2024 11:38   US  PELVIC COMPLETE W TRANSVAGINAL AND TORSION R/O Result Date: 10/31/2024 CLINICAL DATA:  Pelvic pain. EXAM: TRANSABDOMINAL ULTRASOUND OF PELVIS DOPPLER ULTRASOUND OF OVARIES TECHNIQUE: Transabdominal ultrasound examination of the pelvis was performed including evaluation of the uterus, ovaries, adnexal regions, and pelvic cul-de-sac. Color and duplex Doppler ultrasound was utilized to evaluate blood flow to the ovaries. COMPARISON:  04/18/2024 FINDINGS: Uterus Measurements: 8.4 x 4.2 x 4.9 cm = volume: 91 mL. No fibroids or other mass visualized. Endometrium Thickness: 6-7 mm.  No focal abnormality visualized. Right ovary Measurements: 7.5 x 4.3 x 6.3 cm = volume: 107 mL. 3.0 x 3.2 x 3.3 cm simple cyst in the right ovary. An additional 3.7 x 3.9 x 4.1 cm complex cystic lesion evident.: There is normal vascularity on color doppler examination. Spectral doppler arterial and venous waveforms are normal. Left ovary Measurements: Surgically absent Other: No free fluid in the cul-de-sac. IMPRESSION: 1. 4.1 cm complex cystic lesion in the right ovary  without substantial hyperemia on Doppler imaging. This may be a hemorrhagic cyst. Follow-up ultrasound in 6 weeks recommended to ensure resolution. 2. 3.3 cm simple cyst in the right ovary. 3. No evidence for right ovarian torsion. 4. Status post left oophorectomy. Electronically Signed   By: Camellia Candle M.D.   On: 10/31/2024 09:53    EKG/Medicine tests: Not indicated EKG Interpretation:    Interventions: Morphine   See the EMR for full details regarding lab and imaging results.  Patient presents to the emergency department for abdominal pain, has soft but tender abdomen on exam, vitally stable.  Patient warrants broad labs and workup as per initial  plan above, including ultrasound to evaluate for ovarian pathology, and CT to evaluate for non-gynecologic intra-abdominal pathology.  Will treat patient's pain initially with morphine .  Labs obtained and are reassuring, doubt pancreatitis given lipase WNL, no evidence of pancreatitis on CT.  Patient did have hyper kalemia on VBG, feel that this is likely due to hemolysis given CMP does not have evidence of hyperkalemia.  Doubt UTI, pyelonephritis given reassuring UA, CT.  Evaluated patient's right ovary with transvaginal ultrasound, patient does have large right-sided cyst.  CT of the abdomen pelvis does not demonstrate other intra-abdominal pathology, thus feel that patient's pain is likely related to right-sided ovarian cyst, potentially with referred pain to the left given she no longer has a left-sided ovary.  Recommended continuation of Tylenol , ibuprofen , Bentyl , offered refill of Bentyl , patient reports that she has this medication at home.  Recommended continued follow-up with her OB/GYN..  Presentation is most consistent with acute complicated illness and Current presentation is complicated by underlying chronic conditions  Discussion of management or test interpretations with external provider(s): Not indicated  Risk Drugs:Parenteral controlled substances  Disposition: DISCHARGE: I believe that the patient is safe for discharge home with outpatient follow-up. Patient was informed of all pertinent physical exam, laboratory, and imaging findings. Patient's suspected etiology of their symptom presentation was discussed with the patient and all questions were answered. We discussed following up with PCP and OB/GYN. I provided thorough ED return precautions. The patient feels safe and comfortable with this plan.  MDM generated using voice dictation software and may contain dictation errors.  Please contact me for any clarification or with any questions.  Clinical Impression:  1. Pelvic pain in  female      Discharge   Final Clinical Impression(s) / ED Diagnoses Final diagnoses:  Pelvic pain in female    Rx / DC Orders ED Discharge Orders     None        Rogelia Jerilynn RAMAN, MD 10/31/24 1554  "

## 2024-10-31 NOTE — ED Notes (Signed)
"  Pt ambulatory to bathroom for urine sample   "

## 2024-10-31 NOTE — Discharge Instructions (Signed)
 Sara Booth  Thank you for allowing us  to take care of you today.  You came to the Emergency Department today because pain in your pelvis, similar to your prior ovarian cyst.  We got a pelvic ultrasound which shows that you have a large cyst on your right, you are also having pain on your left where you no longer have an ovary so we got a CT of your belly, and this does not show any evidence of damage or inflammation of your other organs.  We recommend using Bentyl  and ibuprofen  for pain control and following up with your primary care physician and your OB/GYN  To-Do: 1. Please follow-up with your primary doctor within 1 - 2 weeks / as soon as possible.   Please return to the Emergency Department or call 911 if you experience have worsening of your symptoms, or do not get better, chest pain, shortness of breath, severe or significantly worsening pain, high fever, severe confusion, pass out or have any reason to think that you need emergency medical care.   We hope you feel better soon.   Mitzie Later, MD Department of Emergency Medicine [facility] [place]

## 2024-10-31 NOTE — ED Triage Notes (Signed)
 Patient c/o ovarian cyst pain starting this morning Rated 9/10 Pain is center lower abd

## 2024-10-31 NOTE — ED Notes (Signed)
 Patient transported to CT

## 2024-11-10 ENCOUNTER — Telehealth: Payer: Self-pay
